# Patient Record
Sex: Female | Born: 1935 | Race: White | Hispanic: No | Smoking: Never smoker
Health system: Southern US, Community
[De-identification: ages and names within clinical notes are randomized; demographics above are authoritative.]

## PROBLEM LIST (undated history)

## (undated) DIAGNOSIS — I10 Essential (primary) hypertension: Secondary | ICD-10-CM

## (undated) DIAGNOSIS — Z8619 Personal history of other infectious and parasitic diseases: Secondary | ICD-10-CM

## (undated) DIAGNOSIS — E7800 Pure hypercholesterolemia, unspecified: Secondary | ICD-10-CM

## (undated) DIAGNOSIS — M1990 Unspecified osteoarthritis, unspecified site: Secondary | ICD-10-CM

## (undated) DIAGNOSIS — R197 Diarrhea, unspecified: Secondary | ICD-10-CM

## (undated) DIAGNOSIS — B029 Zoster without complications: Secondary | ICD-10-CM

## (undated) DIAGNOSIS — B0229 Other postherpetic nervous system involvement: Secondary | ICD-10-CM

## (undated) HISTORY — PX: CHOLECYSTECTOMY: SHX55

## (undated) HISTORY — PX: JOINT REPLACEMENT: SHX530

## (undated) HISTORY — PX: CARPAL TUNNEL RELEASE: SHX101

## (undated) HISTORY — DX: Other postherpetic nervous system involvement: B02.29

## (undated) HISTORY — PX: KNEE ARTHROSCOPY: SUR90

## (undated) HISTORY — PX: ABDOMINAL HYSTERECTOMY: SHX81

## (undated) HISTORY — PX: EYE SURGERY: SHX253

---

## 1995-09-13 ENCOUNTER — Encounter (INDEPENDENT_AMBULATORY_CARE_PROVIDER_SITE_OTHER): Payer: Self-pay | Admitting: *Deleted

## 1995-09-13 LAB — CONVERTED CEMR LAB

## 1998-05-19 ENCOUNTER — Ambulatory Visit (HOSPITAL_COMMUNITY): Admission: RE | Admit: 1998-05-19 | Discharge: 1998-05-19 | Payer: Self-pay | Admitting: Obstetrics & Gynecology

## 1998-09-07 ENCOUNTER — Ambulatory Visit (HOSPITAL_COMMUNITY): Admission: RE | Admit: 1998-09-07 | Discharge: 1998-09-07 | Payer: Self-pay | Admitting: Family Medicine

## 1999-12-02 ENCOUNTER — Ambulatory Visit (HOSPITAL_COMMUNITY): Admission: RE | Admit: 1999-12-02 | Discharge: 1999-12-02 | Payer: Self-pay | Admitting: Family Medicine

## 1999-12-02 ENCOUNTER — Encounter: Payer: Self-pay | Admitting: Family Medicine

## 2000-05-30 ENCOUNTER — Encounter: Admission: RE | Admit: 2000-05-30 | Discharge: 2000-05-30 | Payer: Self-pay | Admitting: Family Medicine

## 2000-06-28 ENCOUNTER — Encounter: Admission: RE | Admit: 2000-06-28 | Discharge: 2000-06-28 | Payer: Self-pay | Admitting: Family Medicine

## 2000-11-01 ENCOUNTER — Encounter: Admission: RE | Admit: 2000-11-01 | Discharge: 2000-11-01 | Payer: Self-pay | Admitting: Family Medicine

## 2000-12-07 ENCOUNTER — Ambulatory Visit (HOSPITAL_COMMUNITY): Admission: RE | Admit: 2000-12-07 | Discharge: 2000-12-07 | Payer: Self-pay | Admitting: *Deleted

## 2001-02-14 ENCOUNTER — Encounter: Admission: RE | Admit: 2001-02-14 | Discharge: 2001-02-14 | Payer: Self-pay | Admitting: Family Medicine

## 2001-06-26 ENCOUNTER — Encounter: Admission: RE | Admit: 2001-06-26 | Discharge: 2001-06-26 | Payer: Self-pay | Admitting: Family Medicine

## 2001-12-12 ENCOUNTER — Ambulatory Visit (HOSPITAL_BASED_OUTPATIENT_CLINIC_OR_DEPARTMENT_OTHER): Admission: RE | Admit: 2001-12-12 | Discharge: 2001-12-12 | Payer: Self-pay | Admitting: Specialist

## 2002-07-18 ENCOUNTER — Encounter (INDEPENDENT_AMBULATORY_CARE_PROVIDER_SITE_OTHER): Payer: Self-pay | Admitting: Specialist

## 2002-07-18 ENCOUNTER — Ambulatory Visit (HOSPITAL_COMMUNITY): Admission: RE | Admit: 2002-07-18 | Discharge: 2002-07-18 | Payer: Self-pay | Admitting: Gastroenterology

## 2003-04-28 ENCOUNTER — Encounter: Admission: RE | Admit: 2003-04-28 | Discharge: 2003-04-28 | Payer: Self-pay | Admitting: Internal Medicine

## 2003-04-28 ENCOUNTER — Encounter: Payer: Self-pay | Admitting: Internal Medicine

## 2005-05-24 ENCOUNTER — Ambulatory Visit (HOSPITAL_COMMUNITY): Admission: RE | Admit: 2005-05-24 | Discharge: 2005-05-24 | Payer: Self-pay | Admitting: Internal Medicine

## 2006-11-09 DIAGNOSIS — M129 Arthropathy, unspecified: Secondary | ICD-10-CM | POA: Insufficient documentation

## 2006-11-09 DIAGNOSIS — G834 Cauda equina syndrome: Secondary | ICD-10-CM

## 2006-11-09 DIAGNOSIS — E669 Obesity, unspecified: Secondary | ICD-10-CM | POA: Insufficient documentation

## 2006-11-09 DIAGNOSIS — E785 Hyperlipidemia, unspecified: Secondary | ICD-10-CM | POA: Insufficient documentation

## 2006-11-09 DIAGNOSIS — F339 Major depressive disorder, recurrent, unspecified: Secondary | ICD-10-CM | POA: Insufficient documentation

## 2006-11-09 DIAGNOSIS — I10 Essential (primary) hypertension: Secondary | ICD-10-CM | POA: Insufficient documentation

## 2006-11-10 ENCOUNTER — Encounter (INDEPENDENT_AMBULATORY_CARE_PROVIDER_SITE_OTHER): Payer: Self-pay | Admitting: *Deleted

## 2006-12-14 ENCOUNTER — Ambulatory Visit (HOSPITAL_COMMUNITY): Admission: RE | Admit: 2006-12-14 | Discharge: 2006-12-14 | Payer: Self-pay | Admitting: *Deleted

## 2008-02-11 ENCOUNTER — Ambulatory Visit (HOSPITAL_COMMUNITY): Admission: RE | Admit: 2008-02-11 | Discharge: 2008-02-11 | Payer: Self-pay | Admitting: Specialist

## 2008-05-26 ENCOUNTER — Inpatient Hospital Stay (HOSPITAL_COMMUNITY): Admission: RE | Admit: 2008-05-26 | Discharge: 2008-05-30 | Payer: Self-pay | Admitting: Specialist

## 2008-07-28 ENCOUNTER — Ambulatory Visit (HOSPITAL_COMMUNITY): Admission: RE | Admit: 2008-07-28 | Discharge: 2008-07-28 | Payer: Self-pay | Admitting: *Deleted

## 2009-07-31 ENCOUNTER — Ambulatory Visit (HOSPITAL_COMMUNITY): Admission: RE | Admit: 2009-07-31 | Discharge: 2009-07-31 | Payer: Self-pay | Admitting: Specialist

## 2009-09-09 ENCOUNTER — Ambulatory Visit (HOSPITAL_COMMUNITY): Admission: RE | Admit: 2009-09-09 | Discharge: 2009-09-09 | Payer: Self-pay | Admitting: *Deleted

## 2010-12-15 LAB — BASIC METABOLIC PANEL
BUN: 11 mg/dL (ref 6–23)
Chloride: 101 mEq/L (ref 96–112)
Creatinine, Ser: 0.52 mg/dL (ref 0.4–1.2)
GFR calc non Af Amer: >60 mL/min (ref >60)
Potassium: 3.4 mEq/L — ABNORMAL LOW (ref 3.5–5.1)

## 2011-01-25 NOTE — H&P (Signed)
NAME:  Terri Liu, Terri Liu                 ACCOUNT NO.:  [id number]   MEDICAL RECORD NO.:  [id number]          PATIENT TYPE:  INP   LOCATION:  NA                           FACILITY:  [facility]   PHYSICIAN:  Jene Every, M.D.    DATE OF BIRTH:  July 26, 1934   DATE OF ADMISSION:  05/26/2008  DATE OF DISCHARGE:                              HISTORY & PHYSICAL   CHIEF COMPLAINT:  Right knee pain.   HISTORY:  Terri Liu is a pleasant 77 year old female who is well-known to  our practice.  She has had history of ongoing knee pain for quite some  time.  She has undergone arthroscopic debridement with only short-term  relief of her symptoms.  She also undergone cortisone therapy with  minimal relief.  The patient notes persistent disabling pain.  Exam does  show decreased range of motion as well as trace effusion, pain with  patellofemoral compression.  X-ray as well a knee arthroscopy revealed  tricompartmental osteoarthrosis.  It was felt at this point the patient  would benefit from a total knee arthroplasty.  The risks and benefits of  this were discussed with the patient.  She does elect to proceed.   MEDICAL HISTORY:  Significant for hypertension, hyperlipidemia.   CURRENT MEDICATIONS:  1. HCTZ 25 mg 1 p.o. daily.  2. Metoprolol 25 mg 1 p.o. daily.  3. Aspirin 81 mg daily.  4. Lovastatin 40 mg 1 p.o. daily.  5. Vitamin D 50,000 units every week.  6. Oxybutynin 5 mg 1 p.o. daily.  7. B12 50 mg.  8. Tylenol.  9. Glucosamine chondroitin.  10.Fish oil.  11.Multivitamin.   ALLERGIES:  None.   PREVIOUS SURGERIES:  1. Cholecystectomy.  2. Hysterectomy.  3. Knee arthroscopy.  4. Bilateral bunionectomy.   SOCIAL HISTORY:  The patient is married.  History negative for tobacco  or alcohol.  She has 3 children.  Family will be caregivers following  surgery.   PRIMARY CARE PHYSICIAN:  Is at [facility].  His name is Dr. Fredrich Birks.   FAMILY HISTORY:  Significant for coronary artery disease and  cancer.   REVIEW OF SYSTEMS:  GENERAL:  The patient denies any fever, chills,  night sweats or bleeding tendencies.  CNS:  No blurred, double vision,  seizure, headache or paralysis.  RESPIRATORY:  No shortness of breath,  productive cough or hemoptysis.  CARDIOVASCULAR/CHEST:  No history of  orthopnea.  GU: No dysuria, hematuria or discharge.  GI: No nausea,  vomiting, diarrhea, constipation, melena or bloody stools.  MUSCULOSKELETAL:  As per HPI.   PHYSICAL EXAMINATION:  This is a n elderly white female seen upright in  no acute distress.  Pulse is 60, respiratory rate 12, BP 112/82.  GENERAL:  She does walk with an antalgic gait.  HEENT:  Atraumatic, normocephalic.  Pupils equal, round, and reactive to  light.  EOMs intact.  NECK:  Supple.  No lymphadenopathy.  CHEST:  Clear to auscultation bilaterally.  BREASTS/GU:  Not examined, not pertinent to HPI.  HEART:  Regular rate and rhythm without murmurs, gallops or rubs.  ABDOMEN:  Protuberant.  Bowel sounds x4.  SKIN:  No rashes or lesions are noted in regard to the knee.  She does have mild effusion on the right.  She is tender along the  medial joint line.  She does have pain with patellofemoral compression.  Range of motion is 0-100 degrees.   IMPRESSION:  Degenerative joint disease right knee.  It has failed  conservative treatment.   PLAN:  The patient will be admitted to [facility] to undergo a  right total knee arthroplasty.  If any medical concerns come up, will  consult [facility].      Roma Schanz, P.A.      Jene Every, M.D.  Electronically Signed    CS/MEDQ  D:  05/23/2008  T:  05/24/2008  Job:  [id number]

## 2011-01-25 NOTE — Op Note (Signed)
NAME:  COXMakaley, Storts                 ACCOUNT NO.:  [id number]   MEDICAL RECORD NO.:  [id number]          PATIENT TYPE:  AMB   LOCATION:  DAY                          FACILITY:  [facility]   PHYSICIAN:  Jene Every, M.D.    DATE OF BIRTH:  1934-07-03   DATE OF PROCEDURE:  02/11/2008  DATE OF DISCHARGE:                               OPERATIVE REPORT   PREOPERATIVE DIAGNOSIS:  Degenerative joint disease, medial meniscus  tear, right knee.   POSTOPERATIVE DIAGNOSIS:  Degenerative joint disease, medial meniscus  tear, right knee, lateral meniscus tear, loose bodies in suprapatellar  pouch.   PROCEDURE PERFORMED:  1. Right knee arthroscopy with extensive debridement.  2. Partial medial meniscectomy.  3. Partial lateral meniscectomy.  4. Removal of loose body, large, 2 cm in diameter.   BRIEF HISTORY AND INDICATIONS:  This is a 77 year old right knee pain,  degenerative changes noted on the radiograph.  She is indicated for  possible total knee replacement.  She had loose body and degenerative  changes and we offered her possible arthroscopic debridement.  Prior to  that risks and benefits were discussed including bleeding, infection, no  change in symptoms, worsening symptoms, need for repeat debridement or  total knee arthroplasty in the future, anesthetic complications etc.   TECHNIQUE:  The patient is placed in supine position.  After induction  of adequate general anesthesia and 1 gram Kefzol, the right lower  extremity was prepped and draped in the usual sterile fashion.  A  lateral parapatellar portal and superomedial parapatellar portal was  fashioned with a #11 blade.  Ingress cannula atraumatically placed.  Irrigant was utilized to insufflate the joint.  Under direct  visualization, medial parapatellar portal was fashioned with a #11 blade  after localization with an 18 gauge needle sparing the medial meniscus.  Extensive degenerative changes were noted within the joint.   Medial  compartment, radial tear of the medial meniscus was resected with a  straight basket rongeur and a 42 Cuda shaver to a stable base.  There  were extensive grade 3 changes of tibial plateau and femoral condyle.  Chondroplasties were performed here.  The remainder of the  meniscus was  stable.  ACL was mildly attenuated lateral compartment revealed  extensive grade 4 changes and lateral meniscus tear.  Collene Mares was  introduced and utilized to perform chondroplasties of the femur and  tibia, partial lateral meniscectomy to a stable base further contoured  with 42  Cuda shaver.  In the suprapatella pouch extensive grade 3  changes of the patella, chondroplasty performed here.  There was a large  loose body measuring 2 cm in diameter.  We made a separate superior  medial portal and inserted a pituitary and removed the loose body with  pituitary without difficulty.  There is normal patellofemoral tracking  but extensive degenerative changes.  Next, the gutters were then  examined.  They were unremarkable.  The knee was copiously lavaged.  Re-  examined all compartments.  No further pathology  amenable to intervention.  Portals were closed with 4-0 nylon simple  sutures, 0.25%  Marcaine with epinephrine was infiltrated in the joint.  The wound was dressed sterilely.  Awakened without difficulty and  transported to recovery in satisfactory addition.  The patient tolerated  the procedure well without complications.      Jene Every, M.D.  Electronically Signed     JB/MEDQ  D:  02/11/2008  T:  02/11/2008  Job:  [id number]

## 2011-01-25 NOTE — Op Note (Signed)
NAME:  Terri Liu, Terri Liu                 ACCOUNT NO.:  [id number]   MEDICAL RECORD NO.:  [id number]          PATIENT TYPE:  INP   LOCATION:  1619                         [facility]ACILITY:  [facility]   PHYSICIAN:  Jene Every, M.D.    DATE OF BIRTH:  1934-08-11   DATE OF PROCEDURE:  05/26/2008  DATE OF DISCHARGE:                               OPERATIVE REPORT   PREOPERATIVE DIAGNOSIS:  Degenerative joint disease, right knee.   POSTOPERATIVE DIAGNOSIS:  Degenerative joint disease, right knee.   PROCEDURE PERFORMED:  Right total knee arthroplasty.   ASSISTANT:  Strader.   COMPONENTS:  DePuy Sigma rotating platform, 3 femur, 3 tibia, 10 insert,  32 patella.   BRIEF HISTORY AND INDICATION:  A 77 year old with end-stage  osteoarthritis of knee indicated for total knee replacement.  Risks and  benefits discussed including bleeding, infection, damage to vascular  structures, suboptimal range of motion, DVT, PE, anesthetic  complications, dissociation with components, need for revision, etc.   TECHNIQUE:  The patient in supine position after the induction of  adequate anesthesia and 2 g of Kefzol, the  right lower extremity was  prepped and draped and exsanguinated in the usual sterile fashion.  Thigh tourniquet inflated to 325 mmHg.  An incision was made; medial  parapatellar arthrotomy was performed.  Clear synovial fluid was  evacuated, patella everted; knee was flexed.  Tricompartmental  osteoarthrosis was noted, osteophytes removed with a rongeur.  The ACL  remnants of the medial and lateral menisci were removed.  Step-drill was  utilized to enter the femoral canal and irrigated 5 degrees, left  intramedullary guide placed 10 mm off the distal femur was cut,  protecting the soft tissue.  This was incised with 3 off the anterior  cortex, tendon slight external rotation.  Anterior, posterior, and  chamfer cuts were then performed.  Box cut was then performed as well,  protecting the soft tissues at  all times.  The tibia was subluxed,  flexed, external alignment guide for off the medial side.  The medial  and lateral sides were identical.  We took 4 off the medial side.  With  bisecting the ankle parallel to the tibia, medial to the tibial  tubercle, oscillating saw utilized for the tibial cut.  Soft tissues  protected at all times.  We then trialed in flexion/extension, and the  gaps were equivalent.  We then trialed  with a grade 3 on the tibia.  Appropriate rotation, maximal coverage, used the punch guide and the pin  guide.  The trial tibia was placed.  A trial femur was placed, 10 insert  reduced, full extension, full flexion, no instability.  Patella was  measured.  Twenty-two were removed, 8 off the patella for a 14.  We  utilized the planer.  Peg holes were then drilled for the patellar  component slightly medializing that.  There was good patellofemoral  tracking, replacement with a trial patella.  All trials were then  removed.  We inspected posteriorly, and small remnants of the medial and  lateral menisci were removed as well as the ACL.  Next, we copiously irrigated with pulsatile lavage, mixed the cement  flexed, dried the surfaces.  We cemented the proximal tibia on the femur  and on the patella back to the tibial component, femoral component,  redundant cement removed, the clamp of the patella component; the trial  10 insert was inserted.  Knee was reduced, held in extension with an  axial load applied.  Redundant cement removed.  Cement cured.  Full  extension, full flexion, no instability, varus-valgus stressing of 0-30  degrees.  Normal patellofemoral tracking.  Removed the trial 10 and  placed a permanent 10 after we inspected the joint.  Redundant cement  removed.  The soft tissue intervening was known, was found and was  copiously irrigated.  Permanent insert was then inserted and impacted;  full extension, full flexion, good patellofemoral tracking.  Placed  a  Hemovac and brought out through a lateral stab wound in the skin.  Marcaine 0.25% with epinephrine was placed in the joint.  Bone wax was  placed in the cancellous surfaces.  Patellar arthrotomy was repaired  with #1 Vicryl interrupted figure-of-eight sutures.  Subcutaneous tissue  reapproximated with 2-0 Vicryl simple sutures.  The skin was  reapproximated with staples.  The wound was dressed sterilely.  She was  awakened without difficulty and transported to the recovery room in  satisfactory condition.  Tourniquet time was 2 hours against gravity  with thigh held.  She had 90 degrees of flexion.  She had full  extension, full flexion and good stability following closure of the  skin.   The patient tolerated the procedure well with no complications.      Jene Every, M.D.  Electronically Signed     JB/MEDQ  D:  05/26/2008  T:  05/27/2008  Job:  [id number]

## 2011-01-28 NOTE — Discharge Summary (Signed)
NAME:  Terri Liu, Terri Liu                 ACCOUNT NO.:  [id number]   MEDICAL RECORD NO.:  [id number]          PATIENT TYPE:  INP   LOCATION:  1619                         [facility]ACILITY:  [facility]   PHYSICIAN:  Jene Every, M.D.    DATE OF BIRTH:  1934/04/02   DATE OF ADMISSION:  05/26/2008  DATE OF DISCHARGE:  05/30/2008                               DISCHARGE SUMMARY   ADMISSION DIAGNOSES:  1. Degenerative joint disease, right knee.  2. Hypertension.  3. Hyperlipidemia.   DISCHARGE DIAGNOSES:  1. Degenerative joint disease, right knee.  2. Hypertension.  3. Hyperlipidemia.  4. Status post right total knee arthroplasty.  5. Resolved hypokalemia.  6. Stable postoperative anemia, resolved.  7. Urinary tract infection.   HISTORY:  Terri Liu is a pleasant 77 year old female who is well known to  our practice.  She has a longstanding history of ongoing knee pain.  She  has undergone arthroscopic debridement in the past with only short-term  relief of her symptoms.  She has also undergone cortisone therapy as  well as viscosupplementation.  Unfortunately, the patient had continued  persistent disabling knee pain.  It is felt at this time she would  benefit from a total knee arthroplasty.  Risks and benefits of this were  discussed with the patient, and she does elect to proceed.   PROCEDURE:  The patient was taken to the OR on central May 26, 2008, and underwent a right total knee arthroplasty.   SURGEON:  Dr. Jene Every   ASSISTANT:  Roma Schanz, PA-C   ANESTHESIA:  General.   COMPLICATIONS:  None.   CONSULTS:  1. PT/OT.  2. Care management.   LABORATORY:  Preoperative CBC shows white cell count of 6.3, hemoglobin  15.9, hematocrit 46.5.  These were monitored throughout the hospital  course.  White cell count remained normal, at time of discharge 6.4;  hemoglobin did drop slightly.  The patient was asymptomatic.  At time of  discharge, hemoglobin was 10.9, hematocrit  31.2.  Coagulation studies  done preoperatively showed PT of 12.4, INR of 0.9, PTT of 23.  Patient  was placed on Coumadin postoperatively.  At time of discharge, she was  therapeutic on her Coumadin with INR of 1.8.  Routine chemistries done  preoperatively showed normal sodium of 143, potassium 4.4, slightly  elevated glucose of 118 with a normal BUN and creatinine.  These were  monitored throughout the hospital course.  Sodium remained stable, at  time of discharge 136; potassium did drop slightly to 3.4.  This was  supplemented.  At time of discharge, she was 3.7.  Glucose continued to  fluctuate during the hospital stay.  Renal function remained stable.  Preoperative urinalysis showed large amount of leukocyte esterase with 7-  10 WBCs seen per high-powered field.  This was repeated at time of  admission which showed worsening of this; it was 11-20 WBCs seen per  high-powered field.  The patient was treated with Cipro.  Urine was sent  for culture which came back no growth.  Therefore, the Cipro was  discontinued.  The patient's blood type was A positive.  I do not see a  copy of the preoperative chest x-ray or EKG in the chart.   HOSPITAL COURSE:  The patient was admitted, taken to the OR and  underwent the above-stated procedure.  One Hemovac drain was placed  intraoperatively.  She was then transferred to the PACU and then to  orthopedic floor for continued postoperative care.  She was placed on  PCA for postoperative pain relief.  She was also started on Coumadin for  DVT prophylaxis.  Postoperatively, the patient did fairly well.  She did  have some episodes initially postoperatively of emesis secondary to pain  medication, otherwise no complaints.  She denied any urinary tract  symptoms, no headache, no chest pain, no shortness of breath.  Vital  signs were stable.  Dressing was clean and dry.  We did discontinue the  O2.  We continued her on Cipro.  She was weaned from the  PCA.  PT/OT  were initiated as well as CPM machine.  On postoperative day #2, the  patient was doing better.  She denied any further emesis overnight.  She  was complaining of headache.  The OT was not discontinued as ordered on  day #1;  __________ was  causing her some sinus pressure.  She was  voiding and passing flatus without difficulty, slightly febrile at 99.3.  Hemovac was discontinued with tip intact.  Dressing was changed.  There  was minimal edema, no evidence of infection.  Calf soft, nontender.  Motor and neurovascular function remained intact.  She had a slight  decrease in her potassium to 3.4.  This was supplemented.  Coumadin was  continued per pharmacy.  We did Hep-Lock her IV.  Incentive spirometer  was encouraged.  The patient continued to progress well with therapy.  Discharge planning was initiated.  Her headache resolved.  She continued  to be slightly febrile on postop day #3 of 100.8 but no evidence of  infection.  She continued to deny any urinary-type symptoms.  On  postoperative day #4, the patient was doing very well.  She had been up  ambulating without difficulty.  She was felt ready for discharge.  Vital  signs were stable.  She was therapeutic on her Coumadin with INR of 1.8,  hemoglobin 10.9.  Urine culture came back negative.   DISPOSITION:  The patient is stable and will be discharged home with  home health PT/OT.  She is to follow up with Dr. Shelle Iron in approximately  10-14 days for suture removal and x-ray.   ACTIVITY:  She is to ambulate as tolerated utilizing her walker.  She  can continue to use her knee immobilizer until she can straight-leg  raise x 10; continue with elevation 6 times a day for 20 minutes at a  time.   DIET:  As tolerated.   WOUND CARE:  She should change her dressing daily; keep this clean and  dry.   DISCHARGE MEDICATIONS:  All home medications with vitamin C 500 mg  daily, Norco 1-2 p.o. q.4-6 h. p.r.n. pain,  over-the-counter stool  softener, multivitamin with iron, Robaxin 500 mg  1 p.o. q.8 h. p.r.n.  spasm.  Coumadin as dosed per pharmacy.   CONDITION ON DISCHARGE:  Stable.   FINAL DIAGNOSIS:  Status post right total knee arthroplasty      Roma Schanz, P.A.      Jene Every, M.D.  Electronically Signed    CS/MEDQ  D:  06/11/2008  T:  06/11/2008  Job:  [id number]

## 2011-01-28 NOTE — Op Note (Signed)
NAME:  Terri Liu, Terri Liu                           ACCOUNT NO.:  [id number]   MEDICAL RECORD NO.:  [id number]                   PATIENT TYPE:  AMB   LOCATION:  ENDO                                 FACILITY:  [facility]   PHYSICIAN:  Danise Edge, M.D.                DATE OF BIRTH:  07/25/1934   DATE OF PROCEDURE:  07/18/2002  DATE OF DISCHARGE:                                 OPERATIVE REPORT   PROCEDURE:  Screening colonoscopy.   INDICATIONS:  The patient is a 77 year old female born 09-Jun-2034.  The  patient was scheduled to undergo her first screening colonoscopy with  polypectomy to prevent colon cancer.  I discussed with her the complications  associated with colonoscopy and polypectomy, including a 15 per thousand  risk of bleeding and one per thousand risk of colon perforation requiring  surgical repair.  The patient has signed the operative permit.   ENDOSCOPIST:  Danise Edge, M.D.   PREMEDICATION:  Fentanyl 100 mcg, Versed 10 mg.   ENDOSCOPE:  Olympus pediatric colonoscope.   DESCRIPTION OF PROCEDURE:  After obtaining informed consent, the patient was  placed in the left lateral decubitus position.  I administered intravenous  fentanyl and intravenous Versed to achieve conscious sedation for the  procedure.  The patient's blood pressure, oxygen saturation, and cardiac  rhythm were monitored throughout the procedure and documented in the medical  record.   Anal inspection was normal.  Digital rectal exam was normal.  The Olympus  pediatric video colonoscope was introduced into the rectum and easily  advanced to the cecum.  Colonic preparation for the exam today was  excellent.   Rectum normal.   Sigmoid colon and descending colon normal.   Splenic flexure normal.   Transverse colon:  From the mid-transverse colon, a 3 mm polyp was lifted  with submucosal saline injection and removed with the cold biopsy forceps  and also the hot biopsy forceps.  I was unable to get a  snare around the  lesion, but it appeared to be completely removed with the biopsy forceps.   Hepatic flexure normal.   Ascending colon normal.   Cecum and ileocecal valve normal.    ASSESSMENT:  From the mid-transverse colon, a 3 mm sessile polyp was  removed; otherwise normal proctocolonoscopy to the cecum.  No endoscopic  evidence for the presence of colon cancer.   RECOMMENDATIONS:  Repeat colonoscopy in five years if polyp returns  neoplastic.                                               Danise Edge, M.D.    MJ/MEDQ  D:  07/18/2002  T:  07/18/2002  Job:  [id number]   cc:   Armstead Peaks, M.D.  [address]  [place], [place] [address]  Fax: [phone]

## 2011-01-28 NOTE — Op Note (Signed)
[place]  Patient:    Terri Liu, Terri Liu Visit Number: [id number] MRN: [id number]          Service Type: DSU Location: [facility] Attending Physician:  Rocky Crafts Dictated by:   Michael Litter. Montez Morita, M.D. Admit Date:  12/12/2001 Discharge Date: 12/12/2001                             Operative Report  PREOPERATIVE DIAGNOSES:  Torn medial and lateral menisci and partial tear, anterior cruciate ligament, involving the left knee.  POSTOPERATIVE DIAGNOSES:  Torn medial and lateral menisci and partial tear, anterior cruciate ligament, involving the left knee.  PROCEDURE:  Arthroscopic debridement, medial and lateral menisci, and partial of anterior cruciate ligament.  DESCRIPTION OF PROCEDURE:  After suitable local anesthesia with sedation, the knee was prepped and draped routinely.  Arthroscopy was carried out through the usual portals with a lateral parapatellar inflow portal and an inferolateral portal for the scope and an inferomedial portal made under direct vision.  The patellofemoral joint had some chondromalacic changes but not down to bare bone.  On the medial side there was bare bone exposing the tibia, about a dime-sized area underneath the tear of the meniscus, which was then trimmed back with a combination of 3.5 and 4.2 rotary meniscotomes as well as a slightly upbiting punch.  At the end the horizontal cleavage remained in the posterior horn that was stable.  The lateral meniscus and lateral joint looked quite good except for the very anterior horn, where there was some fraying and splitting, and this was trimmed back with the rotary meniscotome.  ACL had some partial tearing and its loose fibers were smoothed as well as some thickened synovium at the very anterior part of the joint.  At the end of the procedure 20 cc of 0.5% plain Marcaine were put into the knee. The puncture wounds were sutured with one suture each, a nice  compressing dressing.  She goes to recovery in good condition. Dictated by:   [employer]. Montez Morita, M.D. Attending Physician:  Rocky Crafts DD:  12/12/01 TD:  12/12/01 Job: [id number]

## 2011-06-09 LAB — DIFFERENTIAL
Lymphocytes Relative: 25
Lymphs Abs: 1.6
Monocytes Relative: 8
Neutro Abs: 3.9
Neutrophils Relative %: 63

## 2011-06-09 LAB — CBC
Platelets: 211
RBC: 4.82
WBC: 6.2

## 2011-06-09 LAB — BASIC METABOLIC PANEL
CO2: 31
Calcium: 9.6
Creatinine, Ser: 0.67
Sodium: 143

## 2011-06-13 LAB — BASIC METABOLIC PANEL
BUN: 3 — ABNORMAL LOW
BUN: 5 — ABNORMAL LOW
CO2: 34 — ABNORMAL HIGH
Calcium: 8.1 — ABNORMAL LOW
Chloride: 100
GFR calc non Af Amer: >60
GFR calc non Af Amer: >60
GFR calc non Af Amer: >60
Glucose, Bld: 128 — ABNORMAL HIGH
Potassium: 3.4 — ABNORMAL LOW
Potassium: 4.2
Sodium: 136
Sodium: 139

## 2011-06-13 LAB — URINE CULTURE
Colony Count: NO GROWTH
Culture: NO GROWTH

## 2011-06-13 LAB — CBC
HCT: 36
Hemoglobin: 10.9 — ABNORMAL LOW
Hemoglobin: 11.4 — ABNORMAL LOW
Hemoglobin: 12.4
MCHC: 34.4
MCV: 95.3
Platelets: 160
RBC: 3.24 — ABNORMAL LOW
RBC: 3.3 — ABNORMAL LOW
RBC: 3.77 — ABNORMAL LOW
RDW: 12.4
WBC: 6.4
WBC: 7.4
WBC: 8.6

## 2011-06-13 LAB — PROTIME-INR
INR: 1.1
INR: 1.6 — ABNORMAL HIGH
INR: 1.8 — ABNORMAL HIGH
INR: 1.9 — ABNORMAL HIGH
Prothrombin Time: 22.6 — ABNORMAL HIGH

## 2011-06-15 LAB — COMPREHENSIVE METABOLIC PANEL
AST: 21
BUN: 8
CO2: 31
Chloride: 101
Creatinine, Ser: 0.66
GFR calc non Af Amer: >60
Total Bilirubin: 1.4 — ABNORMAL HIGH

## 2011-06-15 LAB — CBC
HCT: 46.5 — ABNORMAL HIGH
Hemoglobin: 15.9 — ABNORMAL HIGH
Platelets: 235
RBC: 4.83
WBC: 6.3

## 2011-06-15 LAB — DIFFERENTIAL
Eosinophils Absolute: 0.2
Lymphs Abs: 1.9
Neutro Abs: 3.7
Neutrophils Relative %: 58

## 2011-06-15 LAB — URINE MICROSCOPIC-ADD ON

## 2011-06-15 LAB — PROTIME-INR
INR: 0.9
Prothrombin Time: 12.4

## 2011-06-15 LAB — URINALYSIS, ROUTINE W REFLEX MICROSCOPIC
Bilirubin Urine: NEGATIVE
Hgb urine dipstick: NEGATIVE
Nitrite: NEGATIVE
Protein, ur: NEGATIVE
Specific Gravity, Urine: 1.016
Urobilinogen, UA: 0.2
Urobilinogen, UA: 0.2

## 2011-06-15 LAB — TYPE AND SCREEN: Antibody Screen: NEGATIVE

## 2012-04-28 ENCOUNTER — Encounter (HOSPITAL_COMMUNITY): Payer: Self-pay | Admitting: *Deleted

## 2012-04-28 ENCOUNTER — Emergency Department (HOSPITAL_COMMUNITY)
Admission: EM | Admit: 2012-04-28 | Discharge: 2012-04-28 | Disposition: A | Discharge status: Home / Self Care | Payer: Medicare Other | Attending: Emergency Medicine | Admitting: Emergency Medicine

## 2012-04-28 DIAGNOSIS — R51 Headache: Secondary | ICD-10-CM | POA: Insufficient documentation

## 2012-04-28 DIAGNOSIS — B0229 Other postherpetic nervous system involvement: Secondary | ICD-10-CM

## 2012-04-28 HISTORY — DX: Zoster without complications: B02.9

## 2012-04-28 HISTORY — DX: Essential (primary) hypertension: I10

## 2012-04-28 HISTORY — DX: Pure hypercholesterolemia, unspecified: E78.00

## 2012-04-28 MED ORDER — HYDROMORPHONE HCL PF 1 MG/ML IJ SOLN
0.5000 mg | Freq: Once | INTRAMUSCULAR | Status: AC
  Administered 2012-04-28: 0.5 mg via INTRAVENOUS
  Filled 2012-04-28: qty 1

## 2012-04-28 MED ORDER — LORAZEPAM 2 MG/ML IJ SOLN
1.0000 mg | Freq: Once | INTRAMUSCULAR | Status: AC
Start: 2012-04-28 — End: 2012-04-28
  Administered 2012-04-28: 1 mg via INTRAVENOUS
  Filled 2012-04-28: qty 1

## 2012-04-28 MED ORDER — OXYCODONE-ACETAMINOPHEN 5-325 MG PO TABS: 2.0000 | ORAL_TABLET | ORAL | Status: AC | PRN

## 2012-04-28 MED ORDER — LORAZEPAM 1 MG PO TABS: 1.0000 mg | ORAL_TABLET | Freq: Three times a day (TID) | ORAL | Status: AC

## 2012-04-28 MED ORDER — GABAPENTIN 100 MG PO CAPS: 100.0000 mg | ORAL_CAPSULE | Freq: Three times a day (TID) | ORAL | Status: DC

## 2012-04-28 NOTE — ED Provider Notes (Signed)
History     CSN: [id number]  Arrival date & time 04/28/12  0865   First MD Initiated Contact with Patient 04/28/12 1000      Chief Complaint  Patient presents with  . Headache    (Consider location/radiation/quality/duration/timing/severity/associated sxs/prior treatment) Patient is a 78 y.o. female presenting with headaches. The history is provided by the patient.  Headache    patient here complaining of right-sided temporal headache described as burning and intermittent shocks. History of herpes zoster to the same area and has been diagnosed with postherpetic neuralgia. Saw her Dr. for similar symptoms 5 days ago and she was placed on prednisone as well as medications for a urinary tract infection. She was also started on antivirals for possible recurrence of shingles. Nothing makes her symptoms better and touching her skin makes it worse. She describes as a burning-like sensation. No fever, neck pain, photophobia. When the episodes happening last for approximately 10 minutes and have severe. She is taking Neurontin 300 mg a day as well  Past Medical History  Diagnosis Date  . Shingles   . Hypertension   . High cholesterol     History reviewed. No pertinent past surgical history.  History reviewed. No pertinent family history.  History  Substance Use Topics  . Smoking status: Not on file  . Smokeless tobacco: Not on file  . Alcohol Use: No    OB History    Grav Para Term Preterm Abortions TAB SAB Ect Mult Living                  Review of Systems  Neurological: Positive for headaches.  All other systems reviewed and are negative.    Allergies  Review of patient's allergies indicates no known allergies.  Home Medications  No current outpatient prescriptions on file.  BP 176/83  Pulse 114  Temp 97.9 F (36.6 C) (Oral)  Resp 22  Physical Exam  Nursing note and vitals reviewed. Constitutional: She is oriented to person, place, and time. She appears  well-developed and well-nourished.  Non-toxic appearance. No distress.  HENT:  Head: Normocephalic and atraumatic.       No evidence of vesicles or lesions to the patient's face. Slight erythema and tender to touch at the patient's right parietal and forehead.  Eyes: Conjunctivae, EOM and lids are normal. Pupils are equal, round, and reactive to light.       Right eye supple conjunctival hemorrhage. Cornea normal. No eye drainage.  Neck: Normal range of motion. Neck supple. No tracheal deviation present. No mass present.  Cardiovascular: Normal rate, regular rhythm and normal heart sounds.  Exam reveals no gallop.   No murmur heard. Pulmonary/Chest: Effort normal and breath sounds normal. No stridor. No respiratory distress. She has no decreased breath sounds. She has no wheezes. She has no rhonchi. She has no rales.  Abdominal: Soft. Normal appearance and bowel sounds are normal. She exhibits no distension. There is no tenderness. There is no rebound and no CVA tenderness.  Musculoskeletal: Normal range of motion. She exhibits no edema and no tenderness.  Neurological: She is alert and oriented to person, place, and time. She has normal strength. No cranial nerve deficit or sensory deficit. GCS eye subscore is 4. GCS verbal subscore is 5. GCS motor subscore is 6.  Skin: Skin is warm and dry. No abrasion and no rash noted.  Psychiatric: She has a normal mood and affect. Her speech is normal and behavior is normal.    ED  Course  Procedures (including critical care time)  Labs Reviewed - No data to display No results found.   No diagnosis found.    MDM  Spoke with neurology on call and patient given pain medication here. Will increase her Neurontin to 400 mg a day total. She was also told to take capsaicin cream. Will increase her analgesics.  11:59 AM Had a long discussion with the patient and her family concerning treatment options. He relates me that the patient's symptoms actually  been going on for about a year. Patient will be given medications as above and referrals to neurology. Repeat neurological exam at time of discharge remained stable       Toy Baker, MD 04/28/12 1159

## 2012-04-28 NOTE — ED Notes (Signed)
Pt presents to department for evaluation of "burning" sensation to forehead and R side of face. Ongoing x1 week. Pt was seen by PCP and placed on medication to treat shingles, unable to remember name. Pt states she has "episodes" where pain becomes more severe. Now describes as 10/10 "stinging" sensation. Redness noted to R eye. Denies nausea/vomiting. Also states intermittent headache. She is conscious alert and oriented x4. No neurological deficits noted.

## 2012-04-28 NOTE — ED Notes (Signed)
Pt states she feels much better, denies pain at the time. Encouraged to follow up with PCP. Pt ambulatory to discharge.

## 2012-04-28 NOTE — ED Notes (Signed)
Pt reports a "shocking and burning pain" in her head, started on Thursday and went to pcp. She was told it was nerve damage to forehead following shingles one year ago. No acute distress noted at triage.

## 2012-09-26 ENCOUNTER — Other Ambulatory Visit (HOSPITAL_COMMUNITY): Payer: Self-pay | Admitting: Family Medicine

## 2012-09-26 DIAGNOSIS — Z780 Asymptomatic menopausal state: Secondary | ICD-10-CM

## 2012-09-26 DIAGNOSIS — Z1231 Encounter for screening mammogram for malignant neoplasm of breast: Secondary | ICD-10-CM

## 2012-10-05 ENCOUNTER — Other Ambulatory Visit (HOSPITAL_COMMUNITY): Payer: Medicare Other

## 2012-10-05 ENCOUNTER — Ambulatory Visit (HOSPITAL_COMMUNITY): Payer: Medicare Other

## 2012-10-16 ENCOUNTER — Ambulatory Visit (HOSPITAL_COMMUNITY)
Admission: RE | Admit: 2012-10-16 | Discharge: 2012-10-16 | Disposition: A | Discharge status: Home / Self Care | Payer: Medicare Other | Source: Ambulatory Visit | Attending: Family Medicine | Admitting: Family Medicine

## 2012-10-16 ENCOUNTER — Other Ambulatory Visit (HOSPITAL_COMMUNITY): Payer: Self-pay | Admitting: Family Medicine

## 2012-10-16 DIAGNOSIS — Z13820 Encounter for screening for osteoporosis: Secondary | ICD-10-CM | POA: Insufficient documentation

## 2012-10-16 DIAGNOSIS — Z1231 Encounter for screening mammogram for malignant neoplasm of breast: Secondary | ICD-10-CM | POA: Insufficient documentation

## 2012-10-16 DIAGNOSIS — Z780 Asymptomatic menopausal state: Secondary | ICD-10-CM | POA: Insufficient documentation

## 2014-01-13 ENCOUNTER — Other Ambulatory Visit: Payer: Self-pay | Admitting: Psychiatry

## 2014-01-13 DIAGNOSIS — M79603 Pain in arm, unspecified: Secondary | ICD-10-CM

## 2014-01-13 DIAGNOSIS — M542 Cervicalgia: Secondary | ICD-10-CM

## 2014-01-20 ENCOUNTER — Ambulatory Visit
Admission: RE | Admit: 2014-01-20 | Discharge: 2014-01-20 | Disposition: A | Discharge status: Home / Self Care | Payer: Commercial Managed Care - HMO | Source: Ambulatory Visit | Attending: Psychiatry | Admitting: Psychiatry

## 2014-01-20 DIAGNOSIS — M79603 Pain in arm, unspecified: Secondary | ICD-10-CM

## 2014-01-20 DIAGNOSIS — M542 Cervicalgia: Secondary | ICD-10-CM

## 2015-01-19 ENCOUNTER — Encounter (INDEPENDENT_AMBULATORY_CARE_PROVIDER_SITE_OTHER): Payer: PPO | Admitting: Ophthalmology

## 2015-01-19 DIAGNOSIS — H43813 Vitreous degeneration, bilateral: Secondary | ICD-10-CM

## 2015-01-19 DIAGNOSIS — H35371 Puckering of macula, right eye: Secondary | ICD-10-CM

## 2015-01-19 DIAGNOSIS — H35033 Hypertensive retinopathy, bilateral: Secondary | ICD-10-CM | POA: Diagnosis not present

## 2015-01-19 DIAGNOSIS — H338 Other retinal detachments: Secondary | ICD-10-CM

## 2015-01-19 DIAGNOSIS — I10 Essential (primary) hypertension: Secondary | ICD-10-CM | POA: Diagnosis not present

## 2015-01-19 NOTE — H&P (Signed)
PIETRA ZULUAGA is an 81 y.o. female.   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 Shingles   . Hypertension   . High cholesterol     No past surgical history on file.  No family history on file. Social History:  reports that she does not drink alcohol or use illicit drugs. Her tobacco history is not on file.  Allergies: No Known Allergies  No prescriptions prior to admission    Review of systems otherwise negative  There were no vitals taken for this visit.  Physical exam: Mental status: oriented x3. Eyes: See eye exam associated with this date of surgery in media tab.  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

## 2015-01-26 ENCOUNTER — Encounter (HOSPITAL_COMMUNITY): Payer: Self-pay | Admitting: *Deleted

## 2015-01-26 MED ORDER — GATIFLOXACIN 0.5 % OP SOLN: 1.0000 [drp] | OPHTHALMIC | Status: DC | PRN

## 2015-01-26 MED ORDER — CEFAZOLIN SODIUM-DEXTROSE 2-3 GM-% IV SOLR
2.0000 g | INTRAVENOUS | Status: AC
  Administered 2015-01-27: 2 g via INTRAVENOUS

## 2015-01-26 MED ORDER — CYCLOPENTOLATE HCL 1 % OP SOLN: 1.0000 [drp] | OPHTHALMIC | Status: DC

## 2015-01-26 MED ORDER — PHENYLEPHRINE HCL 2.5 % OP SOLN: 1.0000 [drp] | OPHTHALMIC | Status: DC | PRN

## 2015-01-26 MED ORDER — TROPICAMIDE 1 % OP SOLN: 1.0000 [drp] | OPHTHALMIC | Status: DC | PRN

## 2015-01-27 ENCOUNTER — Ambulatory Visit (HOSPITAL_COMMUNITY): Payer: PPO | Admitting: Anesthesiology

## 2015-01-27 ENCOUNTER — Encounter (HOSPITAL_COMMUNITY): Payer: Self-pay | Admitting: General Practice

## 2015-01-27 ENCOUNTER — Ambulatory Visit (HOSPITAL_COMMUNITY)
Admission: RE | Admit: 2015-01-27 | Discharge: 2015-01-28 | Disposition: A | Discharge status: Home / Self Care | Payer: PPO | Source: Ambulatory Visit | Attending: Ophthalmology | Admitting: Ophthalmology

## 2015-01-27 ENCOUNTER — Encounter (HOSPITAL_COMMUNITY)
Admission: RE | Disposition: A | Discharge status: Home / Self Care | Payer: Self-pay | Source: Ambulatory Visit | Attending: Ophthalmology

## 2015-01-27 DIAGNOSIS — F329 Major depressive disorder, single episode, unspecified: Secondary | ICD-10-CM | POA: Diagnosis not present

## 2015-01-27 DIAGNOSIS — I10 Essential (primary) hypertension: Secondary | ICD-10-CM | POA: Diagnosis not present

## 2015-01-27 DIAGNOSIS — Z6830 Body mass index (BMI) 30.0-30.9, adult: Secondary | ICD-10-CM | POA: Insufficient documentation

## 2015-01-27 DIAGNOSIS — E669 Obesity, unspecified: Secondary | ICD-10-CM | POA: Diagnosis not present

## 2015-01-27 DIAGNOSIS — H33001 Unspecified retinal detachment with retinal break, right eye: Secondary | ICD-10-CM | POA: Diagnosis not present

## 2015-01-27 DIAGNOSIS — H35371 Puckering of macula, right eye: Secondary | ICD-10-CM | POA: Diagnosis present

## 2015-01-27 DIAGNOSIS — E780 Pure hypercholesterolemia: Secondary | ICD-10-CM | POA: Diagnosis not present

## 2015-01-27 DIAGNOSIS — H5060 Mechanical strabismus, unspecified: Secondary | ICD-10-CM | POA: Insufficient documentation

## 2015-01-27 DIAGNOSIS — H35379 Puckering of macula, unspecified eye: Secondary | ICD-10-CM | POA: Diagnosis present

## 2015-01-27 HISTORY — DX: Personal history of other infectious and parasitic diseases: Z86.19

## 2015-01-27 HISTORY — DX: Diarrhea, unspecified: R19.7

## 2015-01-27 HISTORY — PX: PERFLUORONE INJECTION: SHX5302

## 2015-01-27 HISTORY — PX: PHOTOCOAGULATION WITH LASER: SHX6027

## 2015-01-27 HISTORY — PX: REPAIR OF COMPLEX TRACTION RETINAL DETACHMENT: SHX6217

## 2015-01-27 HISTORY — PX: MEMBRANE PEEL: SHX5967

## 2015-01-27 HISTORY — DX: Unspecified osteoarthritis, unspecified site: M19.90

## 2015-01-27 HISTORY — PX: VITRECTOMY 25 GAUGE WITH SCLERAL BUCKLE: SHX6183

## 2015-01-27 HISTORY — PX: GAS INSERTION: SHX5336

## 2015-01-27 LAB — CBC
HEMATOCRIT: 44.9 % (ref 36.0–46.0)
Hemoglobin: 16 g/dL — ABNORMAL HIGH (ref 12.0–15.0)
MCH: 32.9 pg (ref 26.0–34.0)
MCHC: 35.6 g/dL (ref 30.0–36.0)
MCV: 92.4 fL (ref 78.0–100.0)
PLATELETS: 210 10*3/uL (ref 150–400)
RBC: 4.86 MIL/uL (ref 3.87–5.11)
RDW: 12.4 % (ref 11.5–15.5)
WBC: 8 10*3/uL (ref 4.0–10.5)

## 2015-01-27 LAB — BASIC METABOLIC PANEL
ANION GAP: 13 (ref 5–15)
BUN: 8 mg/dL (ref 6–20)
CHLORIDE: 97 mmol/L — AB (ref 101–111)
CO2: 26 mmol/L (ref 22–32)
CREATININE: 0.49 mg/dL (ref 0.44–1.00)
Calcium: 9.5 mg/dL (ref 8.9–10.3)
GFR calc Af Amer: >60 mL/min (ref >60)
GFR calc non Af Amer: >60 mL/min (ref >60)
Glucose, Bld: 93 mg/dL (ref 65–99)
POTASSIUM: 3.7 mmol/L (ref 3.5–5.1)
SODIUM: 136 mmol/L (ref 135–145)

## 2015-01-27 SURGERY — VITRECTOMY, USING 25-GAUGE INSTRUMENTS, WITH SCLERAL BUCKLING
Anesthesia: General | Site: Eye | Laterality: Right

## 2015-01-27 MED ORDER — NEOSTIGMINE METHYLSULFATE 10 MG/10ML IV SOLN
INTRAVENOUS | Status: DC | PRN
  Administered 2015-01-27: 4 mg via INTRAVENOUS

## 2015-01-27 MED ORDER — FENTANYL CITRATE (PF) 100 MCG/2ML IJ SOLN
INTRAMUSCULAR | Status: DC | PRN
  Administered 2015-01-27: 50 ug via INTRAVENOUS

## 2015-01-27 MED ORDER — OXYCODONE HCL 5 MG/5ML PO SOLN: 5.0000 mg | Freq: Once | ORAL | Status: AC | PRN

## 2015-01-27 MED ORDER — ARTIFICIAL TEARS OP OINT
TOPICAL_OINTMENT | OPHTHALMIC | Status: DC | PRN
  Administered 2015-01-27: 1 via OPHTHALMIC

## 2015-01-27 MED ORDER — NAPROXEN 250 MG PO TABS: 375.0000 mg | ORAL_TABLET | Freq: Two times a day (BID) | ORAL | Status: DC | PRN

## 2015-01-27 MED ORDER — LIDOCAINE HCL (CARDIAC) 20 MG/ML IV SOLN
INTRAVENOUS | Status: DC | PRN
  Administered 2015-01-27: 60 mg via INTRAVENOUS

## 2015-01-27 MED ORDER — ACETAZOLAMIDE SODIUM 500 MG IJ SOLR
INTRAMUSCULAR | Status: AC
Start: 2015-01-27 — End: 2015-01-27
  Filled 2015-01-27: qty 500

## 2015-01-27 MED ORDER — OXCARBAZEPINE 150 MG PO TABS
150.0000 mg | ORAL_TABLET | Freq: Two times a day (BID) | ORAL | Status: DC
  Filled 2015-01-27 (×3): qty 1

## 2015-01-27 MED ORDER — ATROPINE SULFATE 1 % OP SOLN
OPHTHALMIC | Status: AC
  Filled 2015-01-27: qty 5

## 2015-01-27 MED ORDER — PREDNISOLONE ACETATE 1 % OP SUSP
1.0000 [drp] | Freq: Four times a day (QID) | OPHTHALMIC | Status: DC
  Filled 2015-01-27: qty 5

## 2015-01-27 MED ORDER — PROPOFOL 10 MG/ML IV BOLUS
INTRAVENOUS | Status: AC
  Filled 2015-01-27: qty 20

## 2015-01-27 MED ORDER — GLYCOPYRROLATE 0.2 MG/ML IJ SOLN
INTRAMUSCULAR | Status: AC
  Filled 2015-01-27: qty 3

## 2015-01-27 MED ORDER — BUPIVACAINE HCL (PF) 0.75 % IJ SOLN
INTRAMUSCULAR | Status: AC
Start: 2015-01-27 — End: 2015-01-27
  Filled 2015-01-27: qty 10

## 2015-01-27 MED ORDER — HYPROMELLOSE (GONIOSCOPIC) 2.5 % OP SOLN
OPHTHALMIC | Status: AC
Start: 2015-01-27 — End: 2015-01-27
  Filled 2015-01-27: qty 15

## 2015-01-27 MED ORDER — AMLODIPINE BESYLATE 5 MG PO TABS
5.0000 mg | ORAL_TABLET | Freq: Every day | ORAL | Status: DC
  Filled 2015-01-27: qty 1

## 2015-01-27 MED ORDER — ACETAMINOPHEN 325 MG PO TABS: 325.0000 mg | ORAL_TABLET | ORAL | Status: DC | PRN

## 2015-01-27 MED ORDER — FENTANYL CITRATE (PF) 100 MCG/2ML IJ SOLN
INTRAMUSCULAR | Status: AC
  Filled 2015-01-27: qty 2

## 2015-01-27 MED ORDER — MIDAZOLAM HCL 5 MG/5ML IJ SOLN
INTRAMUSCULAR | Status: DC | PRN
  Administered 2015-01-27: 0.5 mg via INTRAVENOUS

## 2015-01-27 MED ORDER — ASPIRIN EC 81 MG PO TBEC: 81.0000 mg | DELAYED_RELEASE_TABLET | Freq: Every day | ORAL | Status: DC

## 2015-01-27 MED ORDER — NEOSTIGMINE METHYLSULFATE 10 MG/10ML IV SOLN
INTRAVENOUS | Status: AC
  Filled 2015-01-27: qty 1

## 2015-01-27 MED ORDER — SODIUM CHLORIDE 0.9 % IJ SOLN
INTRAMUSCULAR | Status: AC
  Filled 2015-01-27: qty 10

## 2015-01-27 MED ORDER — PROPOFOL 10 MG/ML IV BOLUS
INTRAVENOUS | Status: AC
Start: 2015-01-27 — End: 2015-01-27
  Filled 2015-01-27: qty 20

## 2015-01-27 MED ORDER — SODIUM CHLORIDE 0.45 % IV SOLN
INTRAVENOUS | Status: DC
  Administered 2015-01-27: 20:00:00 via INTRAVENOUS

## 2015-01-27 MED ORDER — FENTANYL CITRATE (PF) 100 MCG/2ML IJ SOLN
25.0000 ug | INTRAMUSCULAR | Status: DC | PRN
  Administered 2015-01-27 (×4): 50 ug via INTRAVENOUS

## 2015-01-27 MED ORDER — SODIUM HYALURONATE 10 MG/ML IO SOLN
INTRAOCULAR | Status: AC
  Filled 2015-01-27: qty 0.85

## 2015-01-27 MED ORDER — CEFAZOLIN SODIUM-DEXTROSE 2-3 GM-% IV SOLR
INTRAVENOUS | Status: AC
  Filled 2015-01-27: qty 50

## 2015-01-27 MED ORDER — PHENYLEPHRINE HCL 2.5 % OP SOLN
1.0000 [drp] | OPHTHALMIC | Status: AC
  Administered 2015-01-27 (×3): 1 [drp] via OPHTHALMIC
  Filled 2015-01-27: qty 2

## 2015-01-27 MED ORDER — TROPICAMIDE 1 % OP SOLN
1.0000 [drp] | OPHTHALMIC | Status: AC
  Administered 2015-01-27 (×3): 1 [drp] via OPHTHALMIC
  Filled 2015-01-27: qty 3

## 2015-01-27 MED ORDER — ONDANSETRON HCL 4 MG/2ML IJ SOLN
INTRAMUSCULAR | Status: AC
  Filled 2015-01-27: qty 2

## 2015-01-27 MED ORDER — PREDNISOLONE ACETATE 1 % OP SUSP: 1.0000 [drp] | Freq: Every day | OPHTHALMIC | Status: DC

## 2015-01-27 MED ORDER — TETRACAINE HCL 0.5 % OP SOLN
2.0000 [drp] | Freq: Once | OPHTHALMIC | Status: DC
  Filled 2015-01-27: qty 2

## 2015-01-27 MED ORDER — DEXAMETHASONE SODIUM PHOSPHATE 10 MG/ML IJ SOLN
INTRAMUSCULAR | Status: AC
  Filled 2015-01-27: qty 1

## 2015-01-27 MED ORDER — EPINEPHRINE HCL 1 MG/ML IJ SOLN
INTRAOCULAR | Status: DC | PRN
  Administered 2015-01-27: 500 mL

## 2015-01-27 MED ORDER — METOPROLOL SUCCINATE ER 25 MG PO TB24
25.0000 mg | ORAL_TABLET | Freq: Every day | ORAL | Status: DC
  Filled 2015-01-27: qty 1

## 2015-01-27 MED ORDER — ARTIFICIAL TEARS OP OINT
TOPICAL_OINTMENT | OPHTHALMIC | Status: AC
  Filled 2015-01-27: qty 3.5

## 2015-01-27 MED ORDER — PRAVASTATIN SODIUM 10 MG PO TABS: 10.0000 mg | ORAL_TABLET | Freq: Every day | ORAL | Status: DC

## 2015-01-27 MED ORDER — BRIMONIDINE TARTRATE 0.2 % OP SOLN
1.0000 [drp] | Freq: Two times a day (BID) | OPHTHALMIC | Status: DC
  Filled 2015-01-27: qty 5

## 2015-01-27 MED ORDER — ROCURONIUM BROMIDE 50 MG/5ML IV SOLN
INTRAVENOUS | Status: AC
  Filled 2015-01-27: qty 1

## 2015-01-27 MED ORDER — BUPIVACAINE HCL (PF) 0.75 % IJ SOLN
INTRAMUSCULAR | Status: DC | PRN
  Administered 2015-01-27: 10 mL

## 2015-01-27 MED ORDER — FENTANYL CITRATE (PF) 250 MCG/5ML IJ SOLN
INTRAMUSCULAR | Status: AC
Start: 2015-01-27 — End: 2015-01-27
  Filled 2015-01-27: qty 5

## 2015-01-27 MED ORDER — TEMAZEPAM 15 MG PO CAPS: 15.0000 mg | ORAL_CAPSULE | Freq: Every evening | ORAL | Status: DC | PRN

## 2015-01-27 MED ORDER — PRENATAL MULTIVITAMIN CH
1.0000 | ORAL_TABLET | Freq: Every day | ORAL | Status: DC
  Filled 2015-01-27 (×2): qty 1

## 2015-01-27 MED ORDER — MIDAZOLAM HCL 2 MG/2ML IJ SOLN
INTRAMUSCULAR | Status: AC
  Filled 2015-01-27: qty 2

## 2015-01-27 MED ORDER — ACETAZOLAMIDE SODIUM 500 MG IJ SOLR
500.0000 mg | Freq: Once | INTRAMUSCULAR | Status: AC
  Administered 2015-01-28: 500 mg via INTRAVENOUS
  Filled 2015-01-27: qty 500

## 2015-01-27 MED ORDER — LIDOCAINE HCL 2 % IJ SOLN
INTRAMUSCULAR | Status: AC
  Filled 2015-01-27: qty 20

## 2015-01-27 MED ORDER — BSS IO SOLN
INTRAOCULAR | Status: AC
  Filled 2015-01-27: qty 15

## 2015-01-27 MED ORDER — MAGNESIUM HYDROXIDE 400 MG/5ML PO SUSP: 15.0000 mL | Freq: Four times a day (QID) | ORAL | Status: DC | PRN

## 2015-01-27 MED ORDER — ATROPINE SULFATE 1 % OP SOLN
OPHTHALMIC | Status: DC | PRN
  Administered 2015-01-27: 2 [drp] via OPHTHALMIC

## 2015-01-27 MED ORDER — GENTAMICIN SULFATE 40 MG/ML IJ SOLN
INTRAMUSCULAR | Status: AC
  Filled 2015-01-27: qty 2

## 2015-01-27 MED ORDER — OXYBUTYNIN CHLORIDE ER 5 MG PO TB24
5.0000 mg | ORAL_TABLET | Freq: Every day | ORAL | Status: DC
  Filled 2015-01-27: qty 1

## 2015-01-27 MED ORDER — LACTATED RINGERS IV SOLN
INTRAVENOUS | Status: DC | PRN
  Administered 2015-01-27: 17:00:00 via INTRAVENOUS

## 2015-01-27 MED ORDER — OXYCODONE HCL 5 MG PO TABS
ORAL_TABLET | ORAL | Status: AC
  Filled 2015-01-27: qty 1

## 2015-01-27 MED ORDER — CYCLOPENTOLATE HCL 1 % OP SOLN
1.0000 [drp] | OPHTHALMIC | Status: AC
  Administered 2015-01-27 (×3): 1 [drp] via OPHTHALMIC
  Filled 2015-01-27: qty 2

## 2015-01-27 MED ORDER — EPINEPHRINE HCL 1 MG/ML IJ SOLN
INTRAMUSCULAR | Status: AC
  Filled 2015-01-27: qty 1

## 2015-01-27 MED ORDER — SODIUM CHLORIDE 0.9 % IV SOLN
INTRAVENOUS | Status: DC
  Administered 2015-01-27 (×2): via INTRAVENOUS

## 2015-01-27 MED ORDER — TRIAMCINOLONE ACETONIDE 40 MG/ML IJ SUSP
INTRAMUSCULAR | Status: AC
  Filled 2015-01-27: qty 5

## 2015-01-27 MED ORDER — LIDOCAINE HCL (CARDIAC) 20 MG/ML IV SOLN
INTRAVENOUS | Status: AC
  Filled 2015-01-27: qty 5

## 2015-01-27 MED ORDER — ONDANSETRON HCL 4 MG/2ML IJ SOLN
4.0000 mg | Freq: Four times a day (QID) | INTRAMUSCULAR | Status: DC | PRN
  Administered 2015-01-27: 4 mg via INTRAVENOUS
  Filled 2015-01-27: qty 2

## 2015-01-27 MED ORDER — SODIUM CHLORIDE 0.9 % IJ SOLN
INTRAMUSCULAR | Status: DC | PRN
  Administered 2015-01-27: 11 mL

## 2015-01-27 MED ORDER — ONDANSETRON HCL 4 MG/2ML IJ SOLN
INTRAMUSCULAR | Status: DC | PRN
  Administered 2015-01-27: 4 mg via INTRAVENOUS

## 2015-01-27 MED ORDER — GATIFLOXACIN 0.5 % OP SOLN
1.0000 [drp] | OPHTHALMIC | Status: AC
  Administered 2015-01-27 (×3): 1 [drp] via OPHTHALMIC
  Filled 2015-01-27: qty 2.5

## 2015-01-27 MED ORDER — GATIFLOXACIN 0.5 % OP SOLN
1.0000 [drp] | Freq: Four times a day (QID) | OPHTHALMIC | Status: DC
  Filled 2015-01-27: qty 2.5

## 2015-01-27 MED ORDER — PROPOFOL 10 MG/ML IV BOLUS
INTRAVENOUS | Status: DC | PRN
  Administered 2015-01-27: 120 mg via INTRAVENOUS

## 2015-01-27 MED ORDER — POLYMYXIN B SULFATE 500000 UNITS IJ SOLR
INTRAMUSCULAR | Status: AC
Start: 2015-01-27 — End: 2015-01-27
  Filled 2015-01-27: qty 1

## 2015-01-27 MED ORDER — HYDROCHLOROTHIAZIDE 25 MG PO TABS: 25.0000 mg | ORAL_TABLET | Freq: Every day | ORAL | Status: DC

## 2015-01-27 MED ORDER — GLYCOPYRROLATE 0.2 MG/ML IJ SOLN
INTRAMUSCULAR | Status: DC | PRN
  Administered 2015-01-27: 0.6 mg via INTRAVENOUS

## 2015-01-27 MED ORDER — PHENYLEPHRINE HCL 10 MG/ML IJ SOLN
10.0000 mg | INTRAVENOUS | Status: DC | PRN
  Administered 2015-01-27: 40 ug/min via INTRAVENOUS

## 2015-01-27 MED ORDER — BACITRACIN-POLYMYXIN B 500-10000 UNIT/GM OP OINT
1.0000 "application " | TOPICAL_OINTMENT | Freq: Three times a day (TID) | OPHTHALMIC | Status: DC
  Filled 2015-01-27: qty 3.5

## 2015-01-27 MED ORDER — LATANOPROST 0.005 % OP SOLN
1.0000 [drp] | Freq: Every day | OPHTHALMIC | Status: DC
  Filled 2015-01-27: qty 2.5

## 2015-01-27 MED ORDER — BACITRACIN-POLYMYXIN B 500-10000 UNIT/GM OP OINT
TOPICAL_OINTMENT | OPHTHALMIC | Status: DC | PRN
  Administered 2015-01-27: 1 via OPHTHALMIC

## 2015-01-27 MED ORDER — DEXAMETHASONE SODIUM PHOSPHATE 10 MG/ML IJ SOLN
INTRAMUSCULAR | Status: DC | PRN
  Administered 2015-01-27: 10 mg

## 2015-01-27 MED ORDER — OXYCODONE HCL 5 MG PO TABS
5.0000 mg | ORAL_TABLET | Freq: Once | ORAL | Status: AC | PRN
  Administered 2015-01-27: 5 mg via ORAL

## 2015-01-27 MED ORDER — ONDANSETRON HCL 4 MG/2ML IJ SOLN: 4.0000 mg | Freq: Four times a day (QID) | INTRAMUSCULAR | Status: DC | PRN

## 2015-01-27 MED ORDER — MORPHINE SULFATE 2 MG/ML IJ SOLN: 1.0000 mg | INTRAMUSCULAR | Status: DC | PRN

## 2015-01-27 MED ORDER — SODIUM HYALURONATE 10 MG/ML IO SOLN
INTRAOCULAR | Status: DC | PRN
  Administered 2015-01-27: 0.85 mL via INTRAOCULAR

## 2015-01-27 MED ORDER — 0.9 % SODIUM CHLORIDE (POUR BTL) OPTIME
TOPICAL | Status: DC | PRN
  Administered 2015-01-27: 1000 mL

## 2015-01-27 MED ORDER — HYDROCODONE-ACETAMINOPHEN 5-325 MG PO TABS: 1.0000 | ORAL_TABLET | ORAL | Status: DC | PRN

## 2015-01-27 MED ORDER — DEXTROSE 5 % IV SOLN
INTRAVENOUS | Status: DC | PRN
  Administered 2015-01-27: 14:00:00 via INTRAVENOUS

## 2015-01-27 MED ORDER — BSS PLUS IO SOLN
INTRAOCULAR | Status: AC
  Filled 2015-01-27: qty 500

## 2015-01-27 MED ORDER — ROCURONIUM BROMIDE 100 MG/10ML IV SOLN
INTRAVENOUS | Status: DC | PRN
  Administered 2015-01-27: 5 mg via INTRAVENOUS
  Administered 2015-01-27: 35 mg via INTRAVENOUS

## 2015-01-27 MED ORDER — BACITRACIN-POLYMYXIN B 500-10000 UNIT/GM OP OINT
TOPICAL_OINTMENT | OPHTHALMIC | Status: AC
Start: 2015-01-27 — End: 2015-01-27
  Filled 2015-01-27: qty 3.5

## 2015-01-27 SURGICAL SUPPLY — 81 items
APL SRG 3 HI ABS STRL LF PLS (MISCELLANEOUS) ×16
APPLICATOR DR MATTHEWS STRL (MISCELLANEOUS) ×32 IMPLANT
BALL CTTN LRG ABS STRL LF (GAUZE/BANDAGES/DRESSINGS) ×6
BAND CIRCLING RETINAL 2.5 (Ophthalmic Related) IMPLANT
BAND RTNL 125X2.5X.6 CRC (Ophthalmic Related) ×2 IMPLANT
BANDAGE EYE OVAL (MISCELLANEOUS) IMPLANT
BLADE EYE CATARACT 19 1.4 BEAV (BLADE) ×3 IMPLANT
CANNULA DUAL BORE 23G (CANNULA) ×3 IMPLANT
CANNULA VLV SOFT TIP 25G (OPHTHALMIC) IMPLANT
CANNULA VLV SOFT TIP 25GA (OPHTHALMIC) ×4 IMPLANT
CORDS BIPOLAR (ELECTRODE) IMPLANT
COTTONBALL LRG STERILE PKG (GAUZE/BANDAGES/DRESSINGS) ×12 IMPLANT
COVER SURGICAL LIGHT HANDLE (MISCELLANEOUS) ×4 IMPLANT
DRAPE OPHTHALMIC 77X100 STRL (CUSTOM PROCEDURE TRAY) ×4 IMPLANT
DUALBORE SIDEFLO CANNULA 25G ×3 IMPLANT
ERASER HMR WETFIELD 23G BP (MISCELLANEOUS) IMPLANT
FILTER BLUE MILLIPORE (MISCELLANEOUS) ×2 IMPLANT
FILTER STRAW FLUID ASPIR (MISCELLANEOUS) IMPLANT
FORCEPS GRIESHABER ILM 25G A (INSTRUMENTS) ×3 IMPLANT
GAS AUTO FILL CONSTEL (OPHTHALMIC) ×4
GAS AUTO FILL CONSTELLATION (OPHTHALMIC) ×1 IMPLANT
GAS OPHTHALMIC (MISCELLANEOUS) IMPLANT
GLOVE SS BIOGEL STRL SZ 6.5 (GLOVE) ×2 IMPLANT
GLOVE SS BIOGEL STRL SZ 7 (GLOVE) ×2 IMPLANT
GLOVE SUPERSENSE BIOGEL SZ 6.5 (GLOVE) ×2
GLOVE SUPERSENSE BIOGEL SZ 7 (GLOVE) ×2
GLOVE SURG 8.5 LATEX PF (GLOVE) ×7 IMPLANT
GLOVE SURG SS PI 6.5 STRL IVOR (GLOVE) ×3 IMPLANT
GLOVE SURG SS PI 7.0 STRL IVOR (GLOVE) ×3 IMPLANT
GOWN STRL REUS W/ TWL LRG LVL3 (GOWN DISPOSABLE) ×6 IMPLANT
GOWN STRL REUS W/TWL LRG LVL3 (GOWN DISPOSABLE) ×16
HANDLE PNEUMATIC FOR CONSTEL (OPHTHALMIC) ×3 IMPLANT
IMPL SILICONE (Ophthalmic Related) ×2 IMPLANT
IMPLANT SILICONE (Ophthalmic Related) ×8 IMPLANT
KIT BASIN OR (CUSTOM PROCEDURE TRAY) ×4 IMPLANT
KIT PERFLUORON PROCEDURE 5ML (MISCELLANEOUS) ×3 IMPLANT
KIT ROOM TURNOVER OR (KITS) ×4 IMPLANT
KNIFE CRESCENT 1.75 EDGEAHEAD (BLADE) IMPLANT
KNIFE GRIESHABER SHARP 2.5MM (MISCELLANEOUS) ×12 IMPLANT
MICROPICK 25G (MISCELLANEOUS) ×4
NDL 18GX1X1/2 (RX/OR ONLY) (NEEDLE) ×1 IMPLANT
NDL 25GX 5/8IN NON SAFETY (NEEDLE) IMPLANT
NDL FILTER BLUNT 18X1 1/2 (NEEDLE) ×1 IMPLANT
NDL HYPO 30X.5 LL (NEEDLE) ×2 IMPLANT
NEEDLE 18GX1X1/2 (RX/OR ONLY) (NEEDLE) ×4 IMPLANT
NEEDLE 25GX 5/8IN NON SAFETY (NEEDLE) IMPLANT
NEEDLE 27GAX1X1/2 (NEEDLE) IMPLANT
NEEDLE FILTER BLUNT 18X 1/2SAF (NEEDLE) ×2
NEEDLE FILTER BLUNT 18X1 1/2 (NEEDLE) ×2 IMPLANT
NEEDLE HYPO 30X.5 LL (NEEDLE) ×8 IMPLANT
NS IRRIG 1000ML POUR BTL (IV SOLUTION) ×4 IMPLANT
PACK VITRECTOMY CUSTOM (CUSTOM PROCEDURE TRAY) ×4 IMPLANT
PAD ARMBOARD 7.5X6 YLW CONV (MISCELLANEOUS) ×8 IMPLANT
PAK VITRECTOMY PIK 25 GA (OPHTHALMIC RELATED) ×3 IMPLANT
PIC ILLUMINATED 25G (OPHTHALMIC) ×4
PICK MICROPICK 25G (MISCELLANEOUS) ×1 IMPLANT
PIK ILLUMINATED 25G (OPHTHALMIC) ×1 IMPLANT
PROBE LASER ILLUM FLEX CVD 25G (OPHTHALMIC) ×3 IMPLANT
ROLLS DENTAL (MISCELLANEOUS) ×8 IMPLANT
SCRAPER DIAMOND 25GA (OPHTHALMIC RELATED) ×3 IMPLANT
SET FLUID INJECTOR (SET/KITS/TRAYS/PACK) IMPLANT
SLEEVE SCLERAL TYPE 270 (Ophthalmic Related) ×3 IMPLANT
SPEAR EYE SURG WECK-CEL (MISCELLANEOUS) ×12 IMPLANT
SPONGE SURGIFOAM ABS GEL 12-7 (HEMOSTASIS) ×3 IMPLANT
SUT CHROMIC 7 0 TG140 8 (SUTURE) ×4 IMPLANT
SUT ETHILON 9 0 TG140 8 (SUTURE) IMPLANT
SUT MERSILENE 4 0 RV 2 (SUTURE) ×8 IMPLANT
SUT SILK 2 0 (SUTURE) ×4
SUT SILK 2-0 18XBRD TIE 12 (SUTURE) ×2 IMPLANT
SUT SILK 4 0 RB 1 (SUTURE) ×4 IMPLANT
SUT VICRYL 7 0 TG140 8 (SUTURE) IMPLANT
SYR 20CC LL (SYRINGE) ×4 IMPLANT
SYR 5ML LL (SYRINGE) ×1 IMPLANT
SYR BULB 3OZ (MISCELLANEOUS) ×4 IMPLANT
SYR TB 1ML LUER SLIP (SYRINGE) ×3 IMPLANT
SYRINGE 10CC LL (SYRINGE) ×1 IMPLANT
TIRE RTNL 2.5XGRV CNCV 9X (Ophthalmic Related) ×1 IMPLANT
TOWEL OR 17X26 10 PK STRL BLUE (TOWEL DISPOSABLE) ×3 IMPLANT
TUBING HIGH PRESS EXTEN 6IN (TUBING) ×3 IMPLANT
WATER STERILE IRR 1000ML POUR (IV SOLUTION) ×4 IMPLANT
WIPE INSTRUMENT VISIWIPE 73X73 (MISCELLANEOUS) ×4 IMPLANT

## 2015-01-27 NOTE — H&P (Signed)
I examined the patient today and there is no change in the medical status 

## 2015-01-27 NOTE — Anesthesia Procedure Notes (Signed)
Procedure Name: Intubation Date/Time: 01/27/2015 1:25 PM Performed by: Williemae Area B Pre-anesthesia Checklist: Emergency Drugs available, Suction available, Patient being monitored and Patient identified Patient Re-evaluated:Patient Re-evaluated prior to inductionOxygen Delivery Method: Circle system utilized Preoxygenation: Pre-oxygenation with 100% oxygen Intubation Type: IV induction Ventilation: Mask ventilation without difficulty Laryngoscope Size: Mac and 3 Grade View: Grade II Tube type: Oral Tube size: 7.5 mm Number of attempts: 1 Airway Equipment and Method: Stylet Placement Confirmation: ETT inserted through vocal cords under direct vision,  breath sounds checked- equal and bilateral and positive ETCO2 Secured at: 21 (cm at gums) cm Tube secured with: Tape Dental Injury: Teeth and Oropharynx as per pre-operative assessment

## 2015-01-27 NOTE — Anesthesia Preprocedure Evaluation (Addendum)
Anesthesia Evaluation  Patient identified by MRN, date of birth, ID band Patient awake    Reviewed: Allergy & Precautions, NPO status , Patient's Chart, lab work & pertinent test results  Airway Mallampati: II  TM Distance: <3 FB Neck ROM: full    Dental  (+) Edentulous Upper, Edentulous Lower, Dental Advisory Given   Pulmonary  breath sounds clear to auscultation        Cardiovascular hypertension, Pt. on medications and Pt. on home beta blockers Rhythm:regular Rate:Normal     Neuro/Psych Depression  Neuromuscular disease    GI/Hepatic   Endo/Other  obese  Renal/GU      Musculoskeletal  (+) Arthritis -,   Abdominal   Peds  Hematology   Anesthesia Other Findings Dentures out to labelled cup to husband  Reproductive/Obstetrics                          Anesthesia Physical Anesthesia Plan  ASA: II  Anesthesia Plan: General   Post-op Pain Management:    Induction: Intravenous  Airway Management Planned: Oral ETT  Additional Equipment:   Intra-op Plan:   Post-operative Plan: Extubation in OR  Informed Consent: I have reviewed the patients History and Physical, chart, labs and discussed the procedure including the risks, benefits and alternatives for the proposed anesthesia with the patient or authorized representative who has indicated his/her understanding and acceptance.     Plan Discussed with: CRNA, Anesthesiologist and Surgeon  Anesthesia Plan Comments:         Anesthesia Quick Evaluation

## 2015-01-27 NOTE — Brief Op Note (Signed)
Brief Operative note   Preoperative diagnosis:  Retinal detachment right eye; Pre-retinal membrane right eye Postoperative diagnosis  * No Diagnosis Codes entered *  Procedures: Repair of complex retinal detachment with scleral buckle, membrane peel, vitrectomy, laser, PFO and C3F8 injection, right eye   Surgeon:  Hayden Pedro, MD...  Assistant:  Deatra Ina SA  Anesthesia: General  Specimen: none  Estimated blood loss:  1cc  Complications: none  Patient sent to PACU in good condition  Composed by Hayden Pedro MD  Dictation number: [id number]

## 2015-01-27 NOTE — Anesthesia Postprocedure Evaluation (Signed)
  Anesthesia Post-op Note  Patient: Terri Liu  Procedure(s) Performed: Procedure(s) with comments: VITRECTOMY 25 GAUGE WITH SCLERAL BUCKLE (Right) MEMBRANE PEEL (Right) PHOTOCOAGULATION WITH LASER (Right) - ENDOLASER INSERTION OF GAS (Right) - C3F8 PERFLUORONE INJECTION (Right) REPAIR OF COMPLEX TRACTION RETINAL DETACHMENT (Right)  Patient Location: PACU  Anesthesia Type:General  Level of Consciousness: awake, alert , oriented and patient cooperative  Airway and Oxygen Therapy: Patient Spontanous Breathing and Patient connected to nasal cannula oxygen  Post-op Pain: none  Post-op Assessment: Post-op Vital signs reviewed, Patient's Cardiovascular Status Stable, Respiratory Function Stable, Patent Airway, No signs of Nausea or vomiting and Pain level controlled  Post-op Vital Signs: Reviewed and stable  Last Vitals:  Filed Vitals:   01/27/15 1800  BP: 144/74  Pulse: 84  Temp:   Resp: 14    Complications: No apparent anesthesia complications

## 2015-01-27 NOTE — Transfer of Care (Signed)
Immediate Anesthesia Transfer of Care Note  Patient: Terri Liu  Procedure(s) Performed: Procedure(s) with comments: VITRECTOMY 25 GAUGE WITH SCLERAL BUCKLE (Right) MEMBRANE PEEL (Right) PHOTOCOAGULATION WITH LASER (Right) - ENDOLASER INSERTION OF GAS (Right) - C3F8 PERFLUORONE INJECTION (Right) REPAIR OF COMPLEX TRACTION RETINAL DETACHMENT (Right)  Patient Location: PACU  Anesthesia Type:General  Level of Consciousness: awake, oriented and patient cooperative  Airway & Oxygen Therapy: Patient Spontanous Breathing and Patient connected to nasal cannula oxygen  Post-op Assessment: Report given to RN, Post -op Vital signs reviewed and stable and Patient moving all extremities  Post vital signs: Reviewed and stable  Last Vitals:  Filed Vitals:   01/27/15 0908  Pulse: 88  Temp: 36.6 C  Resp: 16    Complications: No apparent anesthesia complications

## 2015-01-28 ENCOUNTER — Encounter (HOSPITAL_COMMUNITY): Payer: Self-pay | Admitting: Ophthalmology

## 2015-01-28 DIAGNOSIS — H33001 Unspecified retinal detachment with retinal break, right eye: Secondary | ICD-10-CM | POA: Diagnosis not present

## 2015-01-28 MED ORDER — ATROPINE SULFATE 1 % OP SOLN
1.0000 [drp] | Freq: Two times a day (BID) | OPHTHALMIC | Status: DC
Start: 2015-01-28 — End: 2016-08-15

## 2015-01-28 MED ORDER — HYDROCODONE-ACETAMINOPHEN 5-325 MG PO TABS: 1.0000 | ORAL_TABLET | ORAL | Status: DC | PRN

## 2015-01-28 MED ORDER — PREDNISOLONE ACETATE 1 % OP SUSP: 1.0000 [drp] | Freq: Four times a day (QID) | OPHTHALMIC | Status: DC

## 2015-01-28 MED ORDER — BRIMONIDINE TARTRATE 0.2 % OP SOLN: 1.0000 [drp] | Freq: Two times a day (BID) | OPHTHALMIC | Status: DC

## 2015-01-28 MED ORDER — ATROPINE SULFATE 1 % OP SOLN: 1.0000 [drp] | Freq: Two times a day (BID) | OPHTHALMIC | Status: DC

## 2015-01-28 MED ORDER — BACITRACIN-POLYMYXIN B 500-10000 UNIT/GM OP OINT: 1.0000 "application " | TOPICAL_OINTMENT | Freq: Three times a day (TID) | OPHTHALMIC | Status: DC

## 2015-01-28 MED ORDER — GATIFLOXACIN 0.5 % OP SOLN: 1.0000 [drp] | Freq: Four times a day (QID) | OPHTHALMIC | Status: DC

## 2015-01-28 NOTE — Op Note (Signed)
NAME:  Terri Liu, Terri Liu                 ACCOUNT NO.:  [id number]  MEDICAL RECORD NO.:  [id number]  LOCATION:  6N31C                        FACILITY:  [facility]  PHYSICIAN:  Chrystie Nose. Zigmund Daniel, M.D. DATE OF BIRTH:  24-Jun-1934  DATE OF PROCEDURE:  01/27/2015 DATE OF DISCHARGE:                              OPERATIVE REPORT   ADMISSION DIAGNOSIS:  Rhegmatogenous retinal detachment, preretinal fibrosis, right eye.  PROCEDURES:  Repair of complex retinal detachment with scleral buckle, pars plana vitrectomy, retinal photocoagulation, membrane peel, gas- fluid exchange, Perfluoron injection, Perfluoron removal, all in the right eye.  SURGEON:  Chrystie Nose. Zigmund Daniel, M.D.  ASSISTANT:  Deatra Ina, SA.  ANESTHESIA:  General.  DESCRIPTION OF PROCEDURE:  After usual prep and drape, 360 degree limbal peritomy,M isolation of 4 rectus muscles on 2-0 silk, scleral dissection for 360 degrees to admit a #279 intrascleral implant with 1 mm trim from the posterior edge.  Diathermy placed in the bed.  The 279 implant was placed around the globe with a joint at 2 o'clock.  240 band was placed around the globe with a 270 sleeve at 2 o'clock.  The buckle was adjusted and trimmed.  The band was adjusted and trimmed.  The attention was carried to the pars plana where 25-gauge trocars were placed at 8, 10, and 2 o'clock, and 25-gauge infusion at 8 o'clock.  The contact lens ring was anchored into place at 6 and 12 o'clock.  Provisc placed on the corneal surface and the flat contact lens was placed.  Pars plana vitrectomy in a core fashion was performed with removal of dense debris and large white dots and the small yellow dots from the vitreous cavity. The vitrectomy was carried down to the macular surface where the macula was thrown into folds from preretinal fibrosis.  The membrane was attacked with the lighted pick with a 25-gauge pick with the diamond- dusted membrane scraper and also intra-ocular automated  forceps.  The membrane was peeled under direct visualization with the magnifying contact lens.  Once the entire membrane was removed, the attention was carried to the mid periphery where the BIOM viewing system was moved into place.  Contact lens ring and contact lenses were removed. Additional Provisc placed on the corneal surface.  The vitrectomy was carried in the mid periphery where surface proliferation was removed with the vitreous cutter.  The vitrectomy was carried in the far periphery where multiple areas of previously treated retinal breaks were seen lying on the scleral buckle.  Perforation site was chosen externally at 10 o'clock.  A large amount of clear colorless subretinal fluid came forth in a controlled fashion.  The retina became reattached internally at this point.  Perfluoron was injected into the vitreous cavity to reattach the retina to the buckle, so that laser could be performed.  The endolaser was positioned in the eye, 1558 burns were placed around retinal breaks on the scleral buckle.  The power was 400 mW, 1000 microns each, and 0.1 seconds each in being.  A total of retinal re-attachment was obtained at this point.  Perfluoron was removed and a sterile gas was used to replace it.  A washout procedure was  performed to ensure that all PFO was removed.  The attention was carried externally where the band was adjusted again and the buckle was adjusted again and trimmed.  Scleral sutures were drawn securely knotted and the free ends removed.  The intravitreal gas was exchanged for 10% C3F8.  Again, the retina was fully reattached at this point.  The instruments were removed from the eye.  The conjunctiva was reposited with 7-0 chromic suture.  Polymyxin and gentamicin were irrigated into tenon space.  Atropine solution was applied.  Marcaine was injected around the globe for postop pain.  Decadron 10 mg was injected into the lower subconjunctival space.  Closing  pressure was 10 with a Barraquer tonometer.  Complications none.  The patient was awakened and taken to recovery in satisfactory condition.  OPERATIVE TIME:  3 hours.     Chrystie Nose. Zigmund Daniel, M.D.     JDM/MEDQ  D:  01/27/2015  T:  01/28/2015  Job:  [id number]

## 2015-01-28 NOTE — Progress Notes (Addendum)
01/28/2015, 6:39 AM  Mental Status:  Awake, Alert, Oriented  Anterior segment: Cornea  Clear    Anterior Chamber Clear    Lens:   IOL,   Intra Ocular Pressure 26 mmHg with Tonopen  Vitreous: Clear 95%gas bubble   Retina:  Attached Good laser reaction   Impression: Excellent result Retina attached   Final Diagnosis: Principal Problem:   Rhegmatogenous retinal detachment of right eye Active Problems:   Preretinal fibrosis, right eye   Epiretinal membrane   Plan: start post operative eye drops.   Add alphagan.  Give one drop of Atropine at bedside.   Discharge to home.  Give post operative instructions  Terri Liu 01/28/2015, 6:39 AM

## 2015-01-28 NOTE — Progress Notes (Signed)
Discharge home. Home discharge instruction given, no question verbalized. 

## 2015-01-28 NOTE — Op Note (Signed)
NAME:  AIMY, SWEETING                 ACCOUNT NO.:  [id number]  MEDICAL RECORD NO.:  [id number]  LOCATION:  6N31C                        FACILITY:  [place]  PHYSICIAN:  Chrystie Nose. Zigmund Daniel, M.D. DATE OF BIRTH:  1934-06-18  DATE OF PROCEDURE:  01/27/2015 DATE OF DISCHARGE:                              OPERATIVE REPORT   NO DICTATION.     Chrystie Nose. Zigmund Daniel, M.D.     JDM/MEDQ  D:  01/27/2015  T:  01/28/2015  Job:  [id number]

## 2015-02-02 ENCOUNTER — Encounter (HOSPITAL_COMMUNITY): Payer: Self-pay | Admitting: Ophthalmology

## 2015-02-03 ENCOUNTER — Inpatient Hospital Stay (INDEPENDENT_AMBULATORY_CARE_PROVIDER_SITE_OTHER): Payer: PPO | Admitting: Ophthalmology

## 2015-02-03 DIAGNOSIS — H338 Other retinal detachments: Secondary | ICD-10-CM

## 2015-02-25 ENCOUNTER — Encounter (INDEPENDENT_AMBULATORY_CARE_PROVIDER_SITE_OTHER): Payer: PPO | Admitting: Ophthalmology

## 2015-02-25 DIAGNOSIS — H338 Other retinal detachments: Secondary | ICD-10-CM

## 2015-05-06 ENCOUNTER — Encounter (INDEPENDENT_AMBULATORY_CARE_PROVIDER_SITE_OTHER): Payer: PPO | Admitting: Ophthalmology

## 2015-05-06 DIAGNOSIS — H338 Other retinal detachments: Secondary | ICD-10-CM

## 2015-05-06 DIAGNOSIS — H33302 Unspecified retinal break, left eye: Secondary | ICD-10-CM | POA: Diagnosis not present

## 2015-05-06 DIAGNOSIS — H43812 Vitreous degeneration, left eye: Secondary | ICD-10-CM

## 2015-05-06 DIAGNOSIS — I10 Essential (primary) hypertension: Secondary | ICD-10-CM | POA: Diagnosis not present

## 2015-05-06 DIAGNOSIS — H35033 Hypertensive retinopathy, bilateral: Secondary | ICD-10-CM

## 2015-10-13 DIAGNOSIS — M170 Bilateral primary osteoarthritis of knee: Secondary | ICD-10-CM | POA: Diagnosis not present

## 2015-10-13 DIAGNOSIS — M1712 Unilateral primary osteoarthritis, left knee: Secondary | ICD-10-CM | POA: Diagnosis not present

## 2015-10-13 DIAGNOSIS — M25562 Pain in left knee: Secondary | ICD-10-CM | POA: Diagnosis not present

## 2015-10-13 DIAGNOSIS — M7541 Impingement syndrome of right shoulder: Secondary | ICD-10-CM | POA: Diagnosis not present

## 2015-10-13 DIAGNOSIS — M25511 Pain in right shoulder: Secondary | ICD-10-CM | POA: Diagnosis not present

## 2015-10-15 DIAGNOSIS — R7301 Impaired fasting glucose: Secondary | ICD-10-CM | POA: Diagnosis not present

## 2015-10-15 DIAGNOSIS — I10 Essential (primary) hypertension: Secondary | ICD-10-CM | POA: Diagnosis not present

## 2015-10-15 DIAGNOSIS — E785 Hyperlipidemia, unspecified: Secondary | ICD-10-CM | POA: Diagnosis not present

## 2015-10-15 DIAGNOSIS — R8299 Other abnormal findings in urine: Secondary | ICD-10-CM | POA: Diagnosis not present

## 2015-10-15 DIAGNOSIS — G518 Other disorders of facial nerve: Secondary | ICD-10-CM | POA: Diagnosis not present

## 2015-10-15 DIAGNOSIS — Z01818 Encounter for other preprocedural examination: Secondary | ICD-10-CM | POA: Diagnosis not present

## 2015-10-15 DIAGNOSIS — M150 Primary generalized (osteo)arthritis: Secondary | ICD-10-CM | POA: Diagnosis not present

## 2015-10-19 DIAGNOSIS — E162 Hypoglycemia, unspecified: Secondary | ICD-10-CM | POA: Diagnosis not present

## 2015-10-22 DIAGNOSIS — E876 Hypokalemia: Secondary | ICD-10-CM | POA: Diagnosis not present

## 2015-11-02 DIAGNOSIS — R300 Dysuria: Secondary | ICD-10-CM | POA: Diagnosis not present

## 2015-11-23 ENCOUNTER — Ambulatory Visit: Payer: Self-pay | Admitting: Orthopedic Surgery

## 2015-12-08 ENCOUNTER — Ambulatory Visit: Payer: Self-pay | Admitting: Orthopedic Surgery

## 2015-12-08 NOTE — Patient Instructions (Addendum)
Terri Liu  12/08/2015   Your procedure is scheduled on: 12/17/2015    Report to [facility] Main  Entrance take [place]  elevators to 3rd floor to  [place] at    0800 AM.  Call this number if you have problems the morning of surgery [phone]   Remember: ONLY 1 PERSON MAY GO WITH YOU TO SHORT STAY TO GET  READY MORNING OF [place].  Do not eat food or drink liquids :After Midnight.     Take these medicines the morning of surgery with A SIP OF WATER: eyedrop                               You may not have any metal on your body including hair pins and              piercings  Do not wear jewelry, make-up, lotions, powders or perfumes, deodorant             Do not wear nail polish.  Do not shave  48 hours prior to surgery.                 Do not bring valuables to the hospital. [place].  Contacts, dentures or bridgework may not be worn into surgery.  Leave suitcase in the car. After surgery it may be brought to your room.         Special Instructions: coughing and deep breathing exercises, leg exercises               Please read over the following fact sheets you were given: _____________________________________________________________________             [facility] - Preparing for Surgery Before surgery, you can play an important role.  Because skin is not sterile, your skin needs to be as free of germs as possible.  You can reduce the number of germs on your skin by washing with CHG (chlorahexidine gluconate) soap before surgery.  CHG is an antiseptic cleaner which kills germs and bonds with the skin to continue killing germs even after washing. Please DO NOT use if you have an allergy to CHG or antibacterial soaps.  If your skin becomes reddened/irritated stop using the CHG and inform your nurse when you arrive at Short Stay. Do not shave (including legs and underarms) for at least 48  hours prior to the first CHG shower.  You may shave your face/neck. Please follow these instructions carefully:  1.  Shower with CHG Soap the night before surgery and the  morning of Surgery.  2.  If you choose to wash your hair, wash your hair first as usual with your  normal  shampoo.  3.  After you shampoo, rinse your hair and body thoroughly to remove the  shampoo.                           4.  Use CHG as you would any other liquid soap.  You can apply chg directly  to the skin and wash                       Gently  with a scrungie or clean washcloth.  5.  Apply the CHG Soap to your body ONLY FROM THE NECK DOWN.   Do not use on face/ open                           Wound or open sores. Avoid contact with eyes, ears mouth and genitals (private parts).                       Wash face,  Genitals (private parts) with your normal soap.             6.  Wash thoroughly, paying special attention to the area where your surgery  will be performed.  7.  Thoroughly rinse your body with warm water from the neck down.  8.  DO NOT shower/wash with your normal soap after using and rinsing off  the CHG Soap.                9.  Pat yourself dry with a clean towel.            10.  Wear clean pajamas.            11.  Place clean sheets on your bed the night of your first shower and do not  sleep with pets. Day of Surgery : Do not apply any lotions/deodorants the morning of surgery.  Please wear clean clothes to the hospital/surgery center.  FAILURE TO FOLLOW THESE INSTRUCTIONS MAY RESULT IN THE CANCELLATION OF YOUR SURGERY PATIENT SIGNATURE_________________________________  NURSE SIGNATURE__________________________________  ________________________________________________________________________  WHAT IS A BLOOD TRANSFUSION? Blood Transfusion Information  A transfusion is the replacement of blood or some of its parts. Blood is made up of multiple cells which provide different functions.  Red blood cells  carry oxygen and are used for blood loss replacement.  White blood cells fight against infection.  Platelets control bleeding.  Plasma helps clot blood.  Other blood products are available for specialized needs, such as hemophilia or other clotting disorders. BEFORE THE TRANSFUSION  Who gives blood for transfusions?   Healthy volunteers who are fully evaluated to make sure their blood is safe. This is blood bank blood. Transfusion therapy is the safest it has ever been in the practice of medicine. Before blood is taken from a donor, a complete history is taken to make sure that person has no history of diseases nor engages in risky social behavior (examples are intravenous drug use or sexual activity with multiple partners). The donor's travel history is screened to minimize risk of transmitting infections, such as malaria. The donated blood is tested for signs of infectious diseases, such as HIV and hepatitis. The blood is then tested to be sure it is compatible with you in order to minimize the chance of a transfusion reaction. If you or a relative donates blood, this is often done in anticipation of surgery and is not appropriate for emergency situations. It takes many days to process the donated blood. RISKS AND COMPLICATIONS Although transfusion therapy is very safe and saves many lives, the main dangers of transfusion include:  1. Getting an infectious disease. 2. Developing a transfusion reaction. This is an allergic reaction to something in the blood you were given. Every precaution is taken to prevent this. The decision to have a blood transfusion has been considered carefully by your caregiver before blood is given. Blood is not given unless the benefits outweigh the  risks. AFTER THE TRANSFUSION  Right after receiving a blood transfusion, you will usually feel much better and more energetic. This is especially true if your red blood cells have gotten low (anemic). The transfusion  raises the level of the red blood cells which carry oxygen, and this usually causes an energy increase.  The nurse administering the transfusion will monitor you carefully for complications. HOME CARE INSTRUCTIONS  No special instructions are needed after a transfusion. You may find your energy is better. Speak with your caregiver about any limitations on activity for underlying diseases you may have. SEEK MEDICAL CARE IF:   Your condition is not improving after your transfusion.  You develop redness or irritation at the intravenous (IV) site. SEEK IMMEDIATE MEDICAL CARE IF:  Any of the following symptoms occur over the next 12 hours:  Shaking chills.  You have a temperature by mouth above 102 F (38.9 C), not controlled by medicine.  Chest, back, or muscle pain.  People around you feel you are not acting correctly or are confused.  Shortness of breath or difficulty breathing.  Dizziness and fainting.  You get a rash or develop hives.  You have a decrease in urine output.  Your urine turns a dark color or changes to pink, red, or brown. Any of the following symptoms occur over the next 10 days:  You have a temperature by mouth above 102 F (38.9 C), not controlled by medicine.  Shortness of breath.  Weakness after normal activity.  The white part of the eye turns yellow (jaundice).  You have a decrease in the amount of urine or are urinating less often.  Your urine turns a dark color or changes to pink, red, or brown. Document Released: 08/26/2000 Document Revised: 11/21/2011 Document Reviewed: 04/14/2008 ExitCare Patient Information 2014 [place].  _______________________________________________________________________  Incentive Spirometer  An incentive spirometer is a tool that can help keep your lungs clear and active. This tool measures how well you are filling your lungs with each breath. Taking long deep breaths may help reverse or decrease the chance  of developing breathing (pulmonary) problems (especially infection) following:  A long period of time when you are unable to move or be active. BEFORE THE PROCEDURE   If the spirometer includes an indicator to show your best effort, your nurse or respiratory therapist will set it to a desired goal.  If possible, sit up straight or lean slightly forward. Try not to slouch.  Hold the incentive spirometer in an upright position. INSTRUCTIONS FOR USE  3. Sit on the edge of your bed if possible, or sit up as far as you can in bed or on a chair. 4. Hold the incentive spirometer in an upright position. 5. Breathe out normally. 6. Place the mouthpiece in your mouth and seal your lips tightly around it. 7. Breathe in slowly and as deeply as possible, raising the piston or the ball toward the top of the column. 8. Hold your breath for 3-5 seconds or for as long as possible. Allow the piston or ball to fall to the bottom of the column. 9. Remove the mouthpiece from your mouth and breathe out normally. 10. Rest for a few seconds and repeat Steps 1 through 7 at least 10 times every 1-2 hours when you are awake. Take your time and take a few normal breaths between deep breaths. 11. The spirometer may include an indicator to show your best effort. Use the indicator as a goal to work toward  during each repetition. 12. After each set of 10 deep breaths, practice coughing to be sure your lungs are clear. If you have an incision (the cut made at the time of surgery), support your incision when coughing by placing a pillow or rolled up towels firmly against it. Once you are able to get out of bed, walk around indoors and cough well. You may stop using the incentive spirometer when instructed by your caregiver.  RISKS AND COMPLICATIONS  Take your time so you do not get dizzy or light-headed.  If you are in pain, you may need to take or ask for pain medication before doing incentive spirometry. It is harder to  take a deep breath if you are having pain. AFTER USE  Rest and breathe slowly and easily.  It can be helpful to keep track of a log of your progress. Your caregiver can provide you with a simple table to help with this. If you are using the spirometer at home, follow these instructions: [place] IF:   You are having difficultly using the spirometer.  You have trouble using the spirometer as often as instructed.  Your pain medication is not giving enough relief while using the spirometer.  You develop fever of 100.5 F (38.1 C) or higher. SEEK IMMEDIATE MEDICAL CARE IF:   You cough up bloody sputum that had not been present before.  You develop fever of 102 F (38.9 C) or greater.  You develop worsening pain at or near the incision site. MAKE SURE YOU:   Understand these instructions.  Will watch your condition.  Will get help right away if you are not doing well or get worse. Document Released: 01/09/2007 Document Revised: 11/21/2011 Document Reviewed: 03/12/2007 [facility] Patient Information 2014 [place], [place].   ________________________________________________________________________

## 2015-12-08 NOTE — H&P (Signed)
Terri Liu DOB: 16-Dec-1933 Married / Language: English / Race: White Female  H&P Date 12/08/15  Chief Complaint Left knee pain  History of Present Illness  The patient is a 82 year old female who comes in today for a preoperative History and Physical. The patient is scheduled for a left total knee arthroplasty to be performed by Dr. Johnn Hai, MD at [facility] on December 17, 2015. [name] reports chronic pain in the knee progressively worsening refractory to cortisone injections and interfering with ADLs and quality of life. She desires to proceed with surgery. Dr. Tonita Cong and the patient mutually agreed to proceed with a Left total knee replacement. Risks and benefits of the procedure were discussed including stiffness, suboptimal range of motion, persistent pain, infection requiring removal of prosthesis and reinsertion, need for prophylactic antibiotics in the future, for example, dental procedures, possible need for manipulation, revision in the future and also anesthetic complications including DVT, PE, etc. We discussed the perioperative course, time in the hospital, postoperative recovery and the need for elevation to control swelling. We also discussed the predicted range of motion and the probability that squatting and kneeling would be unobtainable in the future. In addition, postoperative anticoagulation was discussed. We have obtained preoperative medical clearance as necessary. Provided her illustrated handout and discussed it in detail. They will enroll in the total joint replacement educational forum at the hospital. Her [facility] pre-op appt is scheduled for tomorrow.  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 Cancer  Father, Brother, Sister.  Social History Tobacco / smoke exposure  09/03/2013: no Children  3 Current work status  retired Furniture conservator/restorer daily Living situation  live with spouse Marital status  Married. Post-Surgical Plans  home with HHPT; one level home, husband as caregiver; 3 steps to enter Advance Directives  none  Medication History  Valsartan (160MG  Tablet, Oral) Active. Hydrochlorothiazide (25MG  Tablet, Oral) Active. OXcarbazepine (150MG  Tablet, Oral) Active. Lovastatin (40MG  Tablet, Oral) Active. PrednisoLONE Acetate (1% Suspension, Ophthalmic) Active. Prenatal Vitamin (Oral) Active. Cherry Concentrate (Oral) Active. Aspirin (81MG  Tablet, Oral) Active. Hair Skin and Nails Formula (Oral) Active. Medications Reconciled  Past Surgical History Total Knee Replacement - Right  Arthroscopic Knee Surgery - Right  Arthroscopic Knee Surgery - Left  Cholecystectomy  Hysterectomy; Total  Corneal Transplant  Detached Retina Repair   Review of Systems  General Not Present- Chills, Fatigue, Fever, Memory Loss, Night Sweats, Weight Gain and Weight Loss. Skin Not Present- Eczema, Hives, Itching, Lesions and Rash. HEENT Not Present- Dentures, Double Vision, Headache, Hearing Loss, Tinnitus and Visual Loss. Respiratory Not Present- Allergies, Chronic Cough, Coughing up blood, Shortness of breath at rest and Shortness of breath with exertion. Cardiovascular Not Present- Chest Pain, Difficulty Breathing Lying Down, Murmur, Palpitations, Racing/skipping heartbeats and Swelling. Gastrointestinal Not Present- Abdominal  Pain, Bloody Stool, Constipation, Diarrhea, Difficulty Swallowing, Heartburn, Jaundice, Loss of appetitie, Nausea and Vomiting. Female Genitourinary Not Present- Blood in Urine, Discharge, Flank Pain, Incontinence, Painful Urination, Urgency, Urinary frequency, Urinary Retention, Urinating at Night and Weak urinary stream. Musculoskeletal Present- Joint  Pain and Joint Swelling. Not Present- Back Pain, Morning Stiffness, Muscle Pain, Muscle Weakness and Spasms. Neurological Not Present- Blackout spells, Difficulty with balance, Dizziness, Paralysis, Tremor and Weakness. Psychiatric Not Present- Insomnia.  Physical Exam General Mental Status -Alert, cooperative and good historian. General Appearance-pleasant, Not in acute distress. Orientation-Oriented X3. Build & Nutrition-Well nourished and Well developed.  Head and Neck Head-normocephalic, atraumatic . Neck Global Assessment - supple, no bruit auscultated on the right, no bruit auscultated on the left.  Eye Pupil - Bilateral-Regular and Round. Motion - Bilateral-EOMI.  Chest and Lung Exam Auscultation Breath sounds - clear at anterior chest wall and clear at posterior chest wall. Adventitious sounds - No Adventitious sounds.  Cardiovascular Auscultation Rhythm - Regular rate and rhythm. Heart Sounds - S1 WNL and S2 WNL. Murmurs & Other Heart Sounds - Auscultation of the heart reveals - No Murmurs.  Abdomen Palpation/Percussion Tenderness - Abdomen is non-tender to palpation. Rigidity (guarding) - Abdomen is soft. Auscultation Auscultation of the abdomen reveals - Bowel sounds normal.  Female Genitourinary Note: Not done, not pertinent to present illness  Musculoskeletal Left knee is exquisitely tender in the medial joint line. Patellofemoral pain on compression. Her range is -5 to 90. Ipsilateral hip and ankle exam is unremarkable. No DVT.  Imaging Left knee AP standing and lateral demonstrates bone on bone  arthrosis, medial compartment, varus deformity, patellofemoral arthrosis.  Assessment & Plan Primary osteoarthritis of left knee (M17.12)  Pt with end-stage Left knee DJD, bone-on-bone, refractory to conservative tx, scheduled for Left total knee replacement by Dr. Tonita Cong on 12/17/15. We again discussed the procedure itself as well as risks, complications and alternatives, including but not limited to DVT, PE, infx, bleeding, failure of procedure, need for secondary procedure including manipulation, nerve injury, ongoing pain/symptoms, anesthesia risk, even stroke or death. Also discussed typical post-op protocols, activity restrictions, need for PT, flexion/extension exercises, time out of work. Discussed need for DVT ppx post-op with Xarelto then ASA per protocol. Discussed dental ppx. Also discussed limitations post-operatively such as kneeling and squatting. All questions were answered. Patient desires to proceed with surgery as scheduled. Will hold supplements, ASA and NSAIDs accordingly. Will remain NPO after MN night before surgery. Will present to [facility] for pre-op testing. Plan Xarelto 2 weeks post-op for DVT ppx then ASA. Plan pain medication, Robaxin, Colace. Plan home with HHPT post-op with family members at home for assistance. Will follow up 10-14 days post-op for suture removal and xrays.  Plan left total knee replacement and removal of loose body  Signed electronically by Lacie Draft, PA-C for Dr. Tonita Cong

## 2015-12-09 ENCOUNTER — Encounter (HOSPITAL_COMMUNITY): Payer: Self-pay

## 2015-12-09 ENCOUNTER — Ambulatory Visit (HOSPITAL_COMMUNITY)
Admission: RE | Admit: 2015-12-09 | Discharge: 2015-12-09 | Disposition: A | Discharge status: Home / Self Care | Payer: PPO | Source: Ambulatory Visit | Attending: Orthopedic Surgery | Admitting: Orthopedic Surgery

## 2015-12-09 ENCOUNTER — Encounter (HOSPITAL_COMMUNITY)
Admission: RE | Admit: 2015-12-09 | Discharge: 2015-12-09 | Disposition: A | Discharge status: Home / Self Care | Payer: PPO | Source: Ambulatory Visit | Attending: Specialist | Admitting: Specialist

## 2015-12-09 ENCOUNTER — Ambulatory Visit: Payer: Self-pay | Admitting: Orthopedic Surgery

## 2015-12-09 DIAGNOSIS — M1712 Unilateral primary osteoarthritis, left knee: Secondary | ICD-10-CM | POA: Diagnosis not present

## 2015-12-09 DIAGNOSIS — Z01818 Encounter for other preprocedural examination: Secondary | ICD-10-CM | POA: Diagnosis not present

## 2015-12-09 LAB — COMPREHENSIVE METABOLIC PANEL
ALT: 21 U/L (ref 14–54)
AST: 29 U/L (ref 15–41)
Albumin: 4.2 g/dL (ref 3.5–5.0)
Alkaline Phosphatase: 55 U/L (ref 38–126)
Anion gap: 9 (ref 5–15)
BILIRUBIN TOTAL: 1.3 mg/dL — AB (ref 0.3–1.2)
BUN: 9 mg/dL (ref 6–20)
CO2: 34 mmol/L — ABNORMAL HIGH (ref 22–32)
Calcium: 9.7 mg/dL (ref 8.9–10.3)
Chloride: 97 mmol/L — ABNORMAL LOW (ref 101–111)
Creatinine, Ser: 0.52 mg/dL (ref 0.44–1.00)
GFR calc Af Amer: >60 mL/min (ref >60)
GLUCOSE: 107 mg/dL — AB (ref 65–99)
Potassium: 4 mmol/L (ref 3.5–5.1)
Sodium: 140 mmol/L (ref 135–145)
TOTAL PROTEIN: 6.8 g/dL (ref 6.5–8.1)

## 2015-12-09 LAB — URINALYSIS, ROUTINE W REFLEX MICROSCOPIC
Bilirubin Urine: NEGATIVE
Glucose, UA: NEGATIVE mg/dL
Hgb urine dipstick: NEGATIVE
Ketones, ur: NEGATIVE mg/dL
Nitrite: NEGATIVE
PH: 7.5 (ref 5.0–8.0)
Protein, ur: NEGATIVE mg/dL
SPECIFIC GRAVITY, URINE: 1.008 (ref 1.005–1.030)

## 2015-12-09 LAB — PROTIME-INR
INR: 1.01 (ref 0.00–1.49)
Prothrombin Time: 13.5 seconds (ref 11.6–15.2)

## 2015-12-09 LAB — CBC
HCT: 41.2 % (ref 36.0–46.0)
Hemoglobin: 14.3 g/dL (ref 12.0–15.0)
MCH: 32.1 pg (ref 26.0–34.0)
MCHC: 34.7 g/dL (ref 30.0–36.0)
MCV: 92.6 fL (ref 78.0–100.0)
Platelets: 257 10*3/uL (ref 150–400)
RBC: 4.45 MIL/uL (ref 3.87–5.11)
RDW: 12.4 % (ref 11.5–15.5)
WBC: 6.2 10*3/uL (ref 4.0–10.5)

## 2015-12-09 LAB — SURGICAL PCR SCREEN
MRSA, PCR: NEGATIVE
Staphylococcus aureus: NEGATIVE

## 2015-12-09 LAB — URINE MICROSCOPIC-ADD ON: RBC / HPF: NONE SEEN RBC/hpf (ref 0–5)

## 2015-12-09 NOTE — Progress Notes (Signed)
ekg 10-15-15 dr brown on chart

## 2015-12-16 MED ORDER — TRANEXAMIC ACID 1000 MG/10ML IV SOLN
1000.0000 mg | INTRAVENOUS | Status: AC
  Administered 2015-12-17: 1000 mg via INTRAVENOUS
  Filled 2015-12-16: qty 10

## 2015-12-17 ENCOUNTER — Inpatient Hospital Stay (HOSPITAL_COMMUNITY)
Admission: RE | Admit: 2015-12-17 | Discharge: 2015-12-20 | DRG: 470 | Disposition: A | Discharge status: Home Health | Payer: PPO | Source: Ambulatory Visit | Attending: Specialist | Admitting: Specialist

## 2015-12-17 ENCOUNTER — Inpatient Hospital Stay (HOSPITAL_COMMUNITY): Payer: PPO

## 2015-12-17 ENCOUNTER — Encounter (HOSPITAL_COMMUNITY)
Admission: RE | Disposition: A | Discharge status: Home Health | Payer: Self-pay | Source: Ambulatory Visit | Attending: Specialist

## 2015-12-17 ENCOUNTER — Inpatient Hospital Stay (HOSPITAL_COMMUNITY): Payer: PPO | Admitting: Registered Nurse

## 2015-12-17 ENCOUNTER — Encounter (HOSPITAL_COMMUNITY): Payer: Self-pay | Admitting: *Deleted

## 2015-12-17 DIAGNOSIS — Z79899 Other long term (current) drug therapy: Secondary | ICD-10-CM | POA: Diagnosis not present

## 2015-12-17 DIAGNOSIS — Z6828 Body mass index (BMI) 28.0-28.9, adult: Secondary | ICD-10-CM | POA: Diagnosis not present

## 2015-12-17 DIAGNOSIS — M1712 Unilateral primary osteoarthritis, left knee: Principal | ICD-10-CM | POA: Diagnosis present

## 2015-12-17 DIAGNOSIS — M25562 Pain in left knee: Secondary | ICD-10-CM | POA: Diagnosis not present

## 2015-12-17 DIAGNOSIS — F329 Major depressive disorder, single episode, unspecified: Secondary | ICD-10-CM | POA: Diagnosis present

## 2015-12-17 DIAGNOSIS — E669 Obesity, unspecified: Secondary | ICD-10-CM | POA: Diagnosis not present

## 2015-12-17 DIAGNOSIS — G8929 Other chronic pain: Secondary | ICD-10-CM | POA: Diagnosis not present

## 2015-12-17 DIAGNOSIS — R112 Nausea with vomiting, unspecified: Secondary | ICD-10-CM | POA: Diagnosis not present

## 2015-12-17 DIAGNOSIS — Z7982 Long term (current) use of aspirin: Secondary | ICD-10-CM

## 2015-12-17 DIAGNOSIS — E7800 Pure hypercholesterolemia, unspecified: Secondary | ICD-10-CM | POA: Diagnosis not present

## 2015-12-17 DIAGNOSIS — M21162 Varus deformity, not elsewhere classified, left knee: Secondary | ICD-10-CM | POA: Diagnosis not present

## 2015-12-17 DIAGNOSIS — Z96652 Presence of left artificial knee joint: Secondary | ICD-10-CM | POA: Diagnosis not present

## 2015-12-17 DIAGNOSIS — M179 Osteoarthritis of knee, unspecified: Secondary | ICD-10-CM | POA: Diagnosis not present

## 2015-12-17 DIAGNOSIS — Z8619 Personal history of other infectious and parasitic diseases: Secondary | ICD-10-CM

## 2015-12-17 DIAGNOSIS — Z96659 Presence of unspecified artificial knee joint: Secondary | ICD-10-CM

## 2015-12-17 DIAGNOSIS — I10 Essential (primary) hypertension: Secondary | ICD-10-CM | POA: Diagnosis not present

## 2015-12-17 DIAGNOSIS — Z471 Aftercare following joint replacement surgery: Secondary | ICD-10-CM | POA: Diagnosis not present

## 2015-12-17 HISTORY — PX: TOTAL KNEE ARTHROPLASTY: SHX125

## 2015-12-17 LAB — TYPE AND SCREEN
ABO/RH(D): A POS
ANTIBODY SCREEN: NEGATIVE

## 2015-12-17 SURGERY — ARTHROPLASTY, KNEE, TOTAL
Anesthesia: General | Site: Knee | Laterality: Left

## 2015-12-17 MED ORDER — MAGNESIUM CITRATE PO SOLN: 1.0000 | Freq: Once | ORAL | Status: DC | PRN

## 2015-12-17 MED ORDER — BISACODYL 5 MG PO TBEC: 5.0000 mg | DELAYED_RELEASE_TABLET | Freq: Every day | ORAL | Status: DC | PRN

## 2015-12-17 MED ORDER — FENTANYL CITRATE (PF) 100 MCG/2ML IJ SOLN
INTRAMUSCULAR | Status: DC | PRN
  Administered 2015-12-17 (×4): 50 ug via INTRAVENOUS

## 2015-12-17 MED ORDER — ONDANSETRON HCL 4 MG PO TABS: 4.0000 mg | ORAL_TABLET | Freq: Four times a day (QID) | ORAL | Status: DC | PRN

## 2015-12-17 MED ORDER — FENTANYL CITRATE (PF) 100 MCG/2ML IJ SOLN
INTRAMUSCULAR | Status: AC
  Filled 2015-12-17: qty 2

## 2015-12-17 MED ORDER — METOCLOPRAMIDE HCL 10 MG PO TABS: 5.0000 mg | ORAL_TABLET | Freq: Three times a day (TID) | ORAL | Status: DC | PRN

## 2015-12-17 MED ORDER — HYDROMORPHONE HCL 2 MG/ML IJ SOLN
INTRAMUSCULAR | Status: AC
  Filled 2015-12-17: qty 1

## 2015-12-17 MED ORDER — DIPHENHYDRAMINE HCL 12.5 MG/5ML PO ELIX: 12.5000 mg | ORAL_SOLUTION | ORAL | Status: DC | PRN

## 2015-12-17 MED ORDER — SODIUM CHLORIDE 0.9 % IR SOLN
Status: DC | PRN
  Administered 2015-12-17: 2000 mL

## 2015-12-17 MED ORDER — DEXTROSE 5 % IV SOLN
500.0000 mg | Freq: Four times a day (QID) | INTRAVENOUS | Status: DC | PRN
  Administered 2015-12-17: 500 mg via INTRAVENOUS
  Filled 2015-12-17 (×2): qty 5

## 2015-12-17 MED ORDER — PHENOL 1.4 % MT LIQD: 1.0000 | OROMUCOSAL | Status: DC | PRN

## 2015-12-17 MED ORDER — METOCLOPRAMIDE HCL 5 MG/ML IJ SOLN
5.0000 mg | Freq: Three times a day (TID) | INTRAMUSCULAR | Status: DC | PRN
  Administered 2015-12-18 (×2): 10 mg via INTRAVENOUS
  Filled 2015-12-17 (×2): qty 2

## 2015-12-17 MED ORDER — DOCUSATE SODIUM 100 MG PO CAPS: 100.0000 mg | ORAL_CAPSULE | Freq: Two times a day (BID) | ORAL | Status: DC | PRN

## 2015-12-17 MED ORDER — BUPIVACAINE-EPINEPHRINE 0.25% -1:200000 IJ SOLN
INTRAMUSCULAR | Status: AC
  Filled 2015-12-17: qty 1

## 2015-12-17 MED ORDER — SODIUM CHLORIDE 0.9 % IR SOLN
Status: AC
  Filled 2015-12-17: qty 1

## 2015-12-17 MED ORDER — MEPERIDINE HCL 50 MG/ML IJ SOLN: 6.2500 mg | INTRAMUSCULAR | Status: DC | PRN

## 2015-12-17 MED ORDER — EPHEDRINE SULFATE 50 MG/ML IJ SOLN
INTRAMUSCULAR | Status: DC | PRN
  Administered 2015-12-17 (×3): 5 mg via INTRAVENOUS

## 2015-12-17 MED ORDER — OXYCODONE-ACETAMINOPHEN 5-325 MG PO TABS: 1.0000 | ORAL_TABLET | ORAL | Status: DC | PRN

## 2015-12-17 MED ORDER — RISAQUAD PO CAPS
1.0000 | ORAL_CAPSULE | Freq: Every day | ORAL | Status: DC
  Administered 2015-12-17 – 2015-12-20 (×3): 1 via ORAL
  Filled 2015-12-17 (×4): qty 1

## 2015-12-17 MED ORDER — KCL IN DEXTROSE-NACL 20-5-0.45 MEQ/L-%-% IV SOLN
INTRAVENOUS | Status: AC
  Administered 2015-12-17 – 2015-12-18 (×2): via INTRAVENOUS
  Filled 2015-12-17 (×3): qty 1000

## 2015-12-17 MED ORDER — LACTATED RINGERS IV SOLN
INTRAVENOUS | Status: DC
  Administered 2015-12-17: 1000 mL via INTRAVENOUS

## 2015-12-17 MED ORDER — ASPIRIN EC 325 MG PO TBEC
325.0000 mg | DELAYED_RELEASE_TABLET | Freq: Every day | ORAL | Status: DC
Start: 2015-12-17 — End: 2015-12-19

## 2015-12-17 MED ORDER — METOCLOPRAMIDE HCL 5 MG/ML IJ SOLN: 10.0000 mg | Freq: Once | INTRAMUSCULAR | Status: DC | PRN

## 2015-12-17 MED ORDER — PROPOFOL 10 MG/ML IV BOLUS
INTRAVENOUS | Status: DC | PRN
  Administered 2015-12-17: 140 mg via INTRAVENOUS

## 2015-12-17 MED ORDER — METHOCARBAMOL 500 MG PO TABS
500.0000 mg | ORAL_TABLET | Freq: Four times a day (QID) | ORAL | Status: DC | PRN
  Administered 2015-12-18 – 2015-12-20 (×5): 500 mg via ORAL
  Filled 2015-12-17 (×5): qty 1

## 2015-12-17 MED ORDER — ONDANSETRON HCL 4 MG/2ML IJ SOLN
INTRAMUSCULAR | Status: AC
  Filled 2015-12-17: qty 2

## 2015-12-17 MED ORDER — ACETAMINOPHEN 325 MG PO TABS
650.0000 mg | ORAL_TABLET | Freq: Four times a day (QID) | ORAL | Status: DC | PRN
  Administered 2015-12-18 (×2): 650 mg via ORAL
  Filled 2015-12-17 (×2): qty 2

## 2015-12-17 MED ORDER — PREDNISOLONE ACETATE 1 % OP SUSP
1.0000 [drp] | Freq: Four times a day (QID) | OPHTHALMIC | Status: DC
  Administered 2015-12-18 – 2015-12-19 (×2): 1 [drp] via OPHTHALMIC
  Filled 2015-12-17: qty 1

## 2015-12-17 MED ORDER — POLYETHYLENE GLYCOL 3350 17 G PO PACK: 17.0000 g | PACK | Freq: Every day | ORAL | Status: DC | PRN

## 2015-12-17 MED ORDER — PHENYLEPHRINE HCL 10 MG/ML IJ SOLN
INTRAMUSCULAR | Status: DC | PRN
  Administered 2015-12-17: 80 ug via INTRAVENOUS

## 2015-12-17 MED ORDER — STERILE WATER FOR IRRIGATION IR SOLN
Status: DC | PRN
  Administered 2015-12-17: 2000 mL

## 2015-12-17 MED ORDER — PROPOFOL 10 MG/ML IV BOLUS
INTRAVENOUS | Status: AC
  Filled 2015-12-17: qty 40

## 2015-12-17 MED ORDER — HYDROMORPHONE HCL 1 MG/ML IJ SOLN
INTRAMUSCULAR | Status: DC | PRN
  Administered 2015-12-17 (×4): .4 mg via INTRAVENOUS

## 2015-12-17 MED ORDER — BUPIVACAINE-EPINEPHRINE 0.25% -1:200000 IJ SOLN
INTRAMUSCULAR | Status: DC | PRN
  Administered 2015-12-17: 40 mL

## 2015-12-17 MED ORDER — ONDANSETRON HCL 4 MG/2ML IJ SOLN
4.0000 mg | Freq: Four times a day (QID) | INTRAMUSCULAR | Status: DC | PRN
  Administered 2015-12-17: 4 mg via INTRAVENOUS
  Filled 2015-12-17: qty 2

## 2015-12-17 MED ORDER — HYDROCHLOROTHIAZIDE 25 MG PO TABS
25.0000 mg | ORAL_TABLET | Freq: Every day | ORAL | Status: DC
  Administered 2015-12-19: 25 mg via ORAL
  Filled 2015-12-17 (×4): qty 1

## 2015-12-17 MED ORDER — DEXTROSE 5 % IV SOLN
2.0000 g | INTRAVENOUS | Status: AC
  Administered 2015-12-17: 2 g via INTRAVENOUS
  Filled 2015-12-17: qty 20

## 2015-12-17 MED ORDER — SUGAMMADEX SODIUM 200 MG/2ML IV SOLN
INTRAVENOUS | Status: AC
  Filled 2015-12-17: qty 2

## 2015-12-17 MED ORDER — MENTHOL 3 MG MT LOZG
1.0000 | LOZENGE | OROMUCOSAL | Status: DC | PRN
  Filled 2015-12-17: qty 9

## 2015-12-17 MED ORDER — ASPIRIN EC 325 MG PO TBEC
325.0000 mg | DELAYED_RELEASE_TABLET | Freq: Two times a day (BID) | ORAL | Status: DC
  Administered 2015-12-17 – 2015-12-20 (×6): 325 mg via ORAL
  Filled 2015-12-17 (×8): qty 1

## 2015-12-17 MED ORDER — DOCUSATE SODIUM 100 MG PO CAPS
100.0000 mg | ORAL_CAPSULE | Freq: Two times a day (BID) | ORAL | Status: DC
  Administered 2015-12-19 – 2015-12-20 (×3): 100 mg via ORAL

## 2015-12-17 MED ORDER — CEFAZOLIN SODIUM-DEXTROSE 2-4 GM/100ML-% IV SOLN
INTRAVENOUS | Status: AC
  Filled 2015-12-17: qty 100

## 2015-12-17 MED ORDER — SUGAMMADEX SODIUM 200 MG/2ML IV SOLN
INTRAVENOUS | Status: DC | PRN
  Administered 2015-12-17: 150 mg via INTRAVENOUS

## 2015-12-17 MED ORDER — HYDROMORPHONE HCL 1 MG/ML IJ SOLN
0.5000 mg | INTRAMUSCULAR | Status: DC | PRN
  Administered 2015-12-17 – 2015-12-18 (×2): 0.5 mg via INTRAVENOUS
  Filled 2015-12-17 (×2): qty 1

## 2015-12-17 MED ORDER — SODIUM CHLORIDE 0.9 % IR SOLN
Status: DC | PRN
  Administered 2015-12-17: 500 mL

## 2015-12-17 MED ORDER — PHENYLEPHRINE 40 MCG/ML (10ML) SYRINGE FOR IV PUSH (FOR BLOOD PRESSURE SUPPORT)
PREFILLED_SYRINGE | INTRAVENOUS | Status: AC
  Filled 2015-12-17: qty 10

## 2015-12-17 MED ORDER — FENTANYL CITRATE (PF) 100 MCG/2ML IJ SOLN
25.0000 ug | INTRAMUSCULAR | Status: DC | PRN
  Administered 2015-12-17 (×3): 50 ug via INTRAVENOUS

## 2015-12-17 MED ORDER — OXYCODONE HCL 5 MG PO TABS
5.0000 mg | ORAL_TABLET | ORAL | Status: DC | PRN
  Administered 2015-12-17: 10 mg via ORAL
  Administered 2015-12-17: 5 mg via ORAL
  Administered 2015-12-18: 10 mg via ORAL
  Administered 2015-12-19 – 2015-12-20 (×7): 5 mg via ORAL
  Filled 2015-12-17 (×4): qty 1
  Filled 2015-12-17: qty 2
  Filled 2015-12-17 (×4): qty 1
  Filled 2015-12-17: qty 2

## 2015-12-17 MED ORDER — ROCURONIUM BROMIDE 100 MG/10ML IV SOLN
INTRAVENOUS | Status: DC | PRN
  Administered 2015-12-17: 30 mg via INTRAVENOUS

## 2015-12-17 MED ORDER — ONDANSETRON HCL 4 MG/2ML IJ SOLN
INTRAMUSCULAR | Status: DC | PRN
  Administered 2015-12-17: 4 mg via INTRAVENOUS

## 2015-12-17 MED ORDER — LIDOCAINE HCL (CARDIAC) 20 MG/ML IV SOLN
INTRAVENOUS | Status: DC | PRN
  Administered 2015-12-17: 80 mg via INTRAVENOUS

## 2015-12-17 MED ORDER — LIDOCAINE HCL (CARDIAC) 20 MG/ML IV SOLN
INTRAVENOUS | Status: AC
  Filled 2015-12-17: qty 5

## 2015-12-17 MED ORDER — ALUM & MAG HYDROXIDE-SIMETH 200-200-20 MG/5ML PO SUSP: 30.0000 mL | ORAL | Status: DC | PRN

## 2015-12-17 MED ORDER — IRBESARTAN 75 MG PO TABS
37.5000 mg | ORAL_TABLET | Freq: Every day | ORAL | Status: DC
  Administered 2015-12-19: 37.5 mg via ORAL
  Filled 2015-12-17 (×4): qty 0.5

## 2015-12-17 MED ORDER — CEFAZOLIN SODIUM-DEXTROSE 2-4 GM/100ML-% IV SOLN
2.0000 g | Freq: Four times a day (QID) | INTRAVENOUS | Status: AC
  Administered 2015-12-17 – 2015-12-18 (×3): 2 g via INTRAVENOUS
  Filled 2015-12-17 (×3): qty 100

## 2015-12-17 MED ORDER — FENTANYL CITRATE (PF) 100 MCG/2ML IJ SOLN
INTRAMUSCULAR | Status: AC
  Administered 2015-12-17: 50 ug via INTRAVENOUS
  Filled 2015-12-17: qty 2

## 2015-12-17 MED ORDER — OXCARBAZEPINE 150 MG PO TABS
150.0000 mg | ORAL_TABLET | Freq: Every day | ORAL | Status: DC
  Administered 2015-12-17 – 2015-12-19 (×3): 150 mg via ORAL
  Filled 2015-12-17 (×4): qty 1

## 2015-12-17 MED ORDER — ACETAMINOPHEN 650 MG RE SUPP: 650.0000 mg | Freq: Four times a day (QID) | RECTAL | Status: DC | PRN

## 2015-12-17 SURGICAL SUPPLY — 56 items
BANDAGE ACE 4X5 VEL STRL LF (GAUZE/BANDAGES/DRESSINGS) ×3 IMPLANT
BANDAGE ACE 6X5 VEL STRL LF (GAUZE/BANDAGES/DRESSINGS) ×3 IMPLANT
BLADE SAG 18X100X1.27 (BLADE) ×3 IMPLANT
BLADE SAW SGTL 13.0X1.19X90.0M (BLADE) ×3 IMPLANT
CAPT KNEE TOTAL 3 ATTUNE ×3 IMPLANT
CEMENT HV SMART SET (Cement) ×6 IMPLANT
CLOSURE WOUND 1/2 X4 (GAUZE/BANDAGES/DRESSINGS) ×1
CLOTH 2% CHLOROHEXIDINE 3PK (PERSONAL CARE ITEMS) ×3 IMPLANT
CUFF TOURN SGL QUICK 34 (TOURNIQUET CUFF) ×3
CUFF TRNQT CYL 34X4X40X1 (TOURNIQUET CUFF) ×1 IMPLANT
DECANTER SPIKE VIAL GLASS SM (MISCELLANEOUS) ×3 IMPLANT
DRAPE LG THREE QUARTER DISP (DRAPES) ×3 IMPLANT
DRAPE ORTHO SPLIT 77X108 STRL (DRAPES) ×6
DRAPE SURG ORHT 6 SPLT 77X108 (DRAPES) ×2 IMPLANT
DRAPE U-SHAPE 47X51 STRL (DRAPES) ×3 IMPLANT
DRSG AQUACEL AG ADV 3.5X10 (GAUZE/BANDAGES/DRESSINGS) ×2 IMPLANT
DRSG TEGADERM 4X4.75 (GAUZE/BANDAGES/DRESSINGS) IMPLANT
DURAPREP 26ML APPLICATOR (WOUND CARE) ×3 IMPLANT
ELECT REM PT RETURN 9FT ADLT (ELECTROSURGICAL) ×3
ELECTRODE REM PT RTRN 9FT ADLT (ELECTROSURGICAL) ×1 IMPLANT
EVACUATOR 1/8 PVC DRAIN (DRAIN) IMPLANT
GAUZE SPONGE 2X2 8PLY STRL LF (GAUZE/BANDAGES/DRESSINGS) IMPLANT
GLOVE BIOGEL PI IND STRL 7.0 (GLOVE) ×1 IMPLANT
GLOVE BIOGEL PI IND STRL 7.5 (GLOVE) IMPLANT
GLOVE BIOGEL PI IND STRL 8 (GLOVE) ×1 IMPLANT
GLOVE BIOGEL PI INDICATOR 7.0 (GLOVE) ×2
GLOVE BIOGEL PI INDICATOR 7.5 (GLOVE) ×10
GLOVE BIOGEL PI INDICATOR 8 (GLOVE) ×2
GLOVE SURG SS PI 7.0 STRL IVOR (GLOVE) ×3 IMPLANT
GLOVE SURG SS PI 7.5 STRL IVOR (GLOVE) ×7 IMPLANT
GLOVE SURG SS PI 8.0 STRL IVOR (GLOVE) ×6 IMPLANT
GOWN STRL REUS W/ TWL LRG LVL3 (GOWN DISPOSABLE) IMPLANT
GOWN STRL REUS W/TWL LRG LVL3 (GOWN DISPOSABLE) ×3
GOWN STRL REUS W/TWL XL LVL3 (GOWN DISPOSABLE) ×8 IMPLANT
HANDPIECE INTERPULSE COAX TIP (DISPOSABLE) ×3
IMMOBILIZER KNEE 20 (SOFTGOODS) ×3
IMMOBILIZER KNEE 20 THIGH 36 (SOFTGOODS) ×1 IMPLANT
MANIFOLD NEPTUNE II (INSTRUMENTS) ×3 IMPLANT
PACK TOTAL KNEE CUSTOM (KITS) ×3 IMPLANT
POSITIONER SURGICAL ARM (MISCELLANEOUS) ×3 IMPLANT
SET HNDPC FAN SPRY TIP SCT (DISPOSABLE) ×1 IMPLANT
SPONGE GAUZE 2X2 STER 10/PKG (GAUZE/BANDAGES/DRESSINGS)
STAPLER VISISTAT (STAPLE) IMPLANT
STRIP CLOSURE SKIN 1/2X4 (GAUZE/BANDAGES/DRESSINGS) ×1 IMPLANT
SUT BONE WAX W31G (SUTURE) IMPLANT
SUT MNCRL AB 4-0 PS2 18 (SUTURE) ×2 IMPLANT
SUT VIC AB 1 CT1 27 (SUTURE) ×6
SUT VIC AB 1 CT1 27XBRD ANTBC (SUTURE) ×2 IMPLANT
SUT VIC AB 2-0 CT1 27 (SUTURE) ×9
SUT VIC AB 2-0 CT1 TAPERPNT 27 (SUTURE) ×3 IMPLANT
SUT VLOC 180 0 24IN GS25 (SUTURE) ×3 IMPLANT
SYR 50ML LL SCALE MARK (SYRINGE) ×3 IMPLANT
TOWER CARTRIDGE SMART MIX (DISPOSABLE) ×3 IMPLANT
TRAY FOLEY W/METER SILVER 14FR (SET/KITS/TRAYS/PACK) ×3 IMPLANT
WRAP KNEE MAXI GEL POST OP (GAUZE/BANDAGES/DRESSINGS) ×3 IMPLANT
YANKAUER SUCT BULB TIP 10FT TU (MISCELLANEOUS) ×3 IMPLANT

## 2015-12-17 NOTE — Anesthesia Postprocedure Evaluation (Signed)
Anesthesia Post Note  Patient: Terri Liu  Procedure(s) Performed: Procedure(s) (LRB): LEFT TOTAL KNEE ARTHROPLASTY (Left)  Patient location during evaluation: PACU Anesthesia Type: General Level of consciousness: awake and alert Pain management: pain level controlled Vital Signs Assessment: post-procedure vital signs reviewed and stable Respiratory status: spontaneous breathing, nonlabored ventilation, respiratory function stable and patient connected to nasal cannula oxygen Cardiovascular status: blood pressure returned to baseline and stable Postop Assessment: no signs of nausea or vomiting Anesthetic complications: no    Last Vitals:  Filed Vitals:   12/17/15 1313 12/17/15 1315  BP:  135/47  Pulse: 96 95  Temp:    Resp: 12 16    Last Pain:  Filed Vitals:   12/17/15 1332  PainSc: 4                  Montez Hageman

## 2015-12-17 NOTE — Transfer of Care (Signed)
Immediate Anesthesia Transfer of Care Note  Patient: Terri Liu  Procedure(s) Performed: Procedure(s): LEFT TOTAL KNEE ARTHROPLASTY (Left)  Patient Location: PACU  Anesthesia Type:General  Level of Consciousness: awake, alert , oriented and patient cooperative  Airway & Oxygen Therapy: Patient Spontanous Breathing and Patient connected to face mask oxygen  Post-op Assessment: Report given to RN, Post -op Vital signs reviewed and stable and Patient moving all extremities  Post vital signs: Reviewed and stable  Last Vitals:  Filed Vitals:   12/17/15 0745  BP: 128/59  Pulse: 84  Temp: 36.5 C  Resp: 16    Complications: No apparent anesthesia complications

## 2015-12-17 NOTE — H&P (View-Only) (Signed)
Terri Liu DOB: 07-Jun-1934 Married / Language: English / Race: White Female  H&P Date 12/08/15  Chief Complaint Left knee pain  History of Present Illness  The patient is a 82 year old female who comes in today for a preoperative History and Physical. The patient is scheduled for a left total knee arthroplasty to be performed by Dr. Johnn Hai, MD at [facility] on December 17, 2015. Terri Liu reports chronic pain in the knee progressively worsening refractory to cortisone injections and interfering with ADLs and quality of life. She desires to proceed with surgery. Dr. Tonita Cong and the patient mutually agreed to proceed with a Left total knee replacement. Risks and benefits of the procedure were discussed including stiffness, suboptimal range of motion, persistent pain, infection requiring removal of prosthesis and reinsertion, need for prophylactic antibiotics in the future, for example, dental procedures, possible need for manipulation, revision in the future and also anesthetic complications including DVT, PE, etc. We discussed the perioperative course, time in the hospital, postoperative recovery and the need for elevation to control swelling. We also discussed the predicted range of motion and the probability that squatting and kneeling would be unobtainable in the future. In addition, postoperative anticoagulation was discussed. We have obtained preoperative medical clearance as necessary. Provided her illustrated handout and discussed it in detail. They will enroll in the total joint replacement educational forum at the hospital. Her [facility] pre-op appt is scheduled for tomorrow.  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 Cancer  Father, Brother, Sister.  Social History Tobacco / smoke exposure  09/03/2013: no Children  3 Current work status  retired Furniture conservator/restorer daily Living situation  live with spouse Marital status  Married. Post-Surgical Plans  home with HHPT; one level home, husband as caregiver; 3 steps to enter Advance Directives  none  Medication History  Valsartan (160MG  Tablet, Oral) Active. Hydrochlorothiazide (25MG  Tablet, Oral) Active. OXcarbazepine (150MG  Tablet, Oral) Active. Lovastatin (40MG  Tablet, Oral) Active. PrednisoLONE Acetate (1% Suspension, Ophthalmic) Active. Prenatal Vitamin (Oral) Active. Cherry Concentrate (Oral) Active. Aspirin (81MG  Tablet, Oral) Active. Hair Skin and Nails Formula (Oral) Active. Medications Reconciled  Past Surgical History Total Knee Replacement - Right  Arthroscopic Knee Surgery - Right  Arthroscopic Knee Surgery - Left  Cholecystectomy  Hysterectomy; Total  Corneal Transplant  Detached Retina Repair   Review of Systems  General Not Present- Chills, Fatigue, Fever, Memory Loss, Night Sweats, Weight Gain and Weight Loss. Skin Not Present- Eczema, Hives, Itching, Lesions and Rash. HEENT Not Present- Dentures, Double Vision, Headache, Hearing Loss, Tinnitus and Visual Loss. Respiratory Not Present- Allergies, Chronic Cough, Coughing up blood, Shortness of breath at rest and Shortness of breath with exertion. Cardiovascular Not Present- Chest Pain, Difficulty Breathing Lying Down, Murmur, Palpitations, Racing/skipping heartbeats and Swelling. Gastrointestinal Not Present- Abdominal  Pain, Bloody Stool, Constipation, Diarrhea, Difficulty Swallowing, Heartburn, Jaundice, Loss of appetitie, Nausea and Vomiting. Female Genitourinary Not Present- Blood in Urine, Discharge, Flank Pain, Incontinence, Painful Urination, Urgency, Urinary frequency, Urinary Retention, Urinating at Night and Weak urinary stream. Musculoskeletal Present- Joint  Pain and Joint Swelling. Not Present- Back Pain, Morning Stiffness, Muscle Pain, Muscle Weakness and Spasms. Neurological Not Present- Blackout spells, Difficulty with balance, Dizziness, Paralysis, Tremor and Weakness. Psychiatric Not Present- Insomnia.  Physical Exam General Mental Status -Alert, cooperative and good historian. General Appearance-pleasant, Not in acute distress. Orientation-Oriented X3. Build & Nutrition-Well nourished and Well developed.  Head and Neck Head-normocephalic, atraumatic . Neck Global Assessment - supple, no bruit auscultated on the right, no bruit auscultated on the left.  Eye Pupil - Bilateral-Regular and Round. Motion - Bilateral-EOMI.  Chest and Lung Exam Auscultation Breath sounds - clear at anterior chest wall and clear at posterior chest wall. Adventitious sounds - No Adventitious sounds.  Cardiovascular Auscultation Rhythm - Regular rate and rhythm. Heart Sounds - S1 WNL and S2 WNL. Murmurs & Other Heart Sounds - Auscultation of the heart reveals - No Murmurs.  Abdomen Palpation/Percussion Tenderness - Abdomen is non-tender to palpation. Rigidity (guarding) - Abdomen is soft. Auscultation Auscultation of the abdomen reveals - Bowel sounds normal.  Female Genitourinary Note: Not done, not pertinent to present illness  Musculoskeletal Left knee is exquisitely tender in the medial joint line. Patellofemoral pain on compression. Her range is -5 to 90. Ipsilateral hip and ankle exam is unremarkable. No DVT.  Imaging Left knee AP standing and lateral demonstrates bone on bone  arthrosis, medial compartment, varus deformity, patellofemoral arthrosis.  Assessment & Plan Primary osteoarthritis of left knee (M17.12)  Pt with end-stage Left knee DJD, bone-on-bone, refractory to conservative tx, scheduled for Left total knee replacement by Dr. Tonita Cong on 12/17/15. We again discussed the procedure itself as well as risks, complications and alternatives, including but not limited to DVT, PE, infx, bleeding, failure of procedure, need for secondary procedure including manipulation, nerve injury, ongoing pain/symptoms, anesthesia risk, even stroke or death. Also discussed typical post-op protocols, activity restrictions, need for PT, flexion/extension exercises, time out of work. Discussed need for DVT ppx post-op with Xarelto then ASA per protocol. Discussed dental ppx. Also discussed limitations post-operatively such as kneeling and squatting. All questions were answered. Patient desires to proceed with surgery as scheduled. Will hold supplements, ASA and NSAIDs accordingly. Will remain NPO after MN night before surgery. [facility] present to [facility] for pre-op testing. Plan Xarelto 2 weeks post-op for DVT ppx then ASA. Plan pain medication, Robaxin, Colace. Plan home with HHPT post-op with family members at home for assistance. Will follow up 10-14 days post-op for suture removal and xrays.  Plan left total knee replacement and removal of loose body  Signed electronically by Lacie Draft, PA-C for Dr. Tonita Cong

## 2015-12-17 NOTE — Brief Op Note (Signed)
12/17/2015  12:03 PM  PATIENT:  Nils Flack Nuss  82 y.o. female  PRE-OPERATIVE DIAGNOSIS:  DJD LEFT KNEE  POST-OPERATIVE DIAGNOSIS:  DJD LEFT KNEE  PROCEDURE:  Procedure(s): LEFT TOTAL KNEE ARTHROPLASTY, REMOVAL OF LOOSE BODIES (Left)  SURGEON:  Surgeon(s) and Role:    * Susa Day, MD - Primary  PHYSICIAN ASSISTANT:   ASSISTANTS: Bissell   ANESTHESIA:   general  EBL:  Total I/O In: -  Out: 125 [Urine:100; Blood:25]  BLOOD ADMINISTERED:none  DRAINS: none   LOCAL MEDICATIONS USED:  MARCAINE     SPECIMEN:  No Specimen  DISPOSITION OF SPECIMEN:  N/A  COUNTS:  YES  TOURNIQUET:   Total Tourniquet Time Documented: Thigh (Left) - 69 minutes Total: Thigh (Left) - 69 minutes   DICTATION: .Other Dictation: Dictation Number   [id number]  PLAN OF CARE: Admit for overnight observation  PATIENT DISPOSITION:  PACU - hemodynamically stable.   Delay start of Pharmacological VTE agent (>24hrs) due to surgical blood loss or risk of bleeding: no

## 2015-12-17 NOTE — Anesthesia Preprocedure Evaluation (Addendum)
Anesthesia Evaluation  Patient identified by MRN, date of birth, ID band Patient awake    Reviewed: Allergy & Precautions, NPO status , Patient's Chart, lab work & pertinent test results  Airway Mallampati: II  TM Distance: <3 FB Neck ROM: full    Dental  (+) Edentulous Upper, Edentulous Lower, Dental Advisory Given   Pulmonary  breath sounds clear to auscultation        Cardiovascular hypertension, Pt. on medications and Pt. on home beta blockers Rhythm:regular Rate:Normal     Neuro/Psych Depression  Neuromuscular disease    GI/Hepatic   Endo/Other  obese  Renal/GU      Musculoskeletal  (+) Arthritis -,   Abdominal   Peds  Hematology   Anesthesia Other Findings Dentures out to labelled cup to husband  Reproductive/Obstetrics                          Anesthesia Physical Anesthesia Plan  ASA: II  Anesthesia Plan: General   Post-op Pain Management:    Induction: Intravenous  Airway Management Planned: Oral ETT  Additional Equipment:   Intra-op Plan:   Post-operative Plan: Extubation in OR  Informed Consent: I have reviewed the patients History and Physical, chart, labs and discussed the procedure including the risks, benefits and alternatives for the proposed anesthesia with the patient or authorized representative who has indicated his/her understanding and acceptance.     Plan Discussed with: CRNA, Anesthesiologist and Surgeon  Anesthesia Plan Comments:         Anesthesia Quick Evaluation  

## 2015-12-17 NOTE — Interval H&P Note (Signed)
History and Physical Interval Note:  12/17/2015 10:08 AM  Terri Liu  has presented today for surgery, with the diagnosis of DJD LEFT KNEE  The various methods of treatment have been discussed with the patient and family. After consideration of risks, benefits and other options for treatment, the patient has consented to  Procedure(s): LEFT TOTAL KNEE ARTHROPLASTY, REMOVAL OF LOOSE BODIES (Left) as a surgical intervention .  The patient's history has been reviewed, patient examined, no change in status, stable for surgery.  I have reviewed the patient's chart and labs.  Questions were answered to the patient's satisfaction.     Stace Peace C

## 2015-12-17 NOTE — Anesthesia Procedure Notes (Signed)
Procedure Name: Intubation Date/Time: 12/17/2015 10:21 AM Performed by: Carleene Cooper A Pre-anesthesia Checklist: Patient identified, Timeout performed, Emergency Drugs available, Suction available and Patient being monitored Patient Re-evaluated:Patient Re-evaluated prior to inductionOxygen Delivery Method: Circle system utilized Preoxygenation: Pre-oxygenation with 100% oxygen Intubation Type: IV induction Ventilation: Mask ventilation without difficulty Laryngoscope Size: Miller and 2 Grade View: Grade I Tube type: Oral Tube size: 7.5 mm Number of attempts: 1 Airway Equipment and Method: Stylet Placement Confirmation: breath sounds checked- equal and bilateral,  ETT inserted through vocal cords under direct vision and positive ETCO2 Secured at: 21 cm Tube secured with: Tape Dental Injury: Teeth and Oropharynx as per pre-operative assessment

## 2015-12-18 LAB — BASIC METABOLIC PANEL
ANION GAP: 7 (ref 5–15)
BUN: 6 mg/dL (ref 6–20)
CALCIUM: 8.5 mg/dL — AB (ref 8.9–10.3)
CHLORIDE: 98 mmol/L — AB (ref 101–111)
CO2: 31 mmol/L (ref 22–32)
Creatinine, Ser: 0.53 mg/dL (ref 0.44–1.00)
GFR calc non Af Amer: >60 mL/min (ref >60)
Glucose, Bld: 131 mg/dL — ABNORMAL HIGH (ref 65–99)
Potassium: 3.4 mmol/L — ABNORMAL LOW (ref 3.5–5.1)
Sodium: 136 mmol/L (ref 135–145)

## 2015-12-18 LAB — CBC
HEMATOCRIT: 37.5 % (ref 36.0–46.0)
HEMOGLOBIN: 12.9 g/dL (ref 12.0–15.0)
MCH: 32.7 pg (ref 26.0–34.0)
MCHC: 34.4 g/dL (ref 30.0–36.0)
MCV: 95.2 fL (ref 78.0–100.0)
Platelets: 213 10*3/uL (ref 150–400)
RBC: 3.94 MIL/uL (ref 3.87–5.11)
RDW: 12.7 % (ref 11.5–15.5)
WBC: 10.2 10*3/uL (ref 4.0–10.5)

## 2015-12-18 MED ORDER — DIAZEPAM 5 MG PO TABS: 5.0000 mg | ORAL_TABLET | Freq: Four times a day (QID) | ORAL | Status: DC | PRN

## 2015-12-18 MED ORDER — DIAZEPAM 5 MG/ML IJ SOLN: 5.0000 mg | Freq: Four times a day (QID) | INTRAMUSCULAR | Status: DC | PRN

## 2015-12-18 MED ORDER — SODIUM CHLORIDE 0.9 % IV SOLN
8.0000 mg | Freq: Four times a day (QID) | INTRAVENOUS | Status: DC | PRN
  Administered 2015-12-18: 8 mg via INTRAVENOUS
  Filled 2015-12-18 (×2): qty 4

## 2015-12-18 MED ORDER — KETOROLAC TROMETHAMINE 15 MG/ML IJ SOLN
7.5000 mg | Freq: Four times a day (QID) | INTRAMUSCULAR | Status: DC
  Administered 2015-12-18 – 2015-12-20 (×8): 7.5 mg via INTRAVENOUS
  Filled 2015-12-18 (×12): qty 1

## 2015-12-18 MED ORDER — ONDANSETRON HCL 4 MG PO TABS
8.0000 mg | ORAL_TABLET | Freq: Four times a day (QID) | ORAL | Status: DC | PRN
  Administered 2015-12-19: 8 mg via ORAL
  Filled 2015-12-18 (×3): qty 2

## 2015-12-18 MED ORDER — HYDROMORPHONE HCL 2 MG PO TABS
2.0000 mg | ORAL_TABLET | ORAL | Status: DC | PRN
  Administered 2015-12-18: 4 mg via ORAL
  Filled 2015-12-18: qty 2

## 2015-12-18 MED ORDER — HYDROMORPHONE HCL 2 MG PO TABS
2.0000 mg | ORAL_TABLET | ORAL | Status: DC | PRN
  Administered 2015-12-19: 2 mg via ORAL
  Filled 2015-12-18: qty 1

## 2015-12-18 NOTE — Op Note (Signed)
NAMESCHELLY, CALLERY                 ACCOUNT NO.:  [id number]  MEDICAL RECORD NO.:  [id number]  LOCATION:  [id number]                         FACILITY:  [facility]  PHYSICIAN:  Susa Day, M.D.    DATE OF BIRTH:  01-09-1934  DATE OF PROCEDURE:  12/17/2015 DATE OF DISCHARGE:                              OPERATIVE REPORT   PREOPERATIVE DIAGNOSIS:  End-stage osteoarthrosis, varus deformity, left knee.  POSTOPERATIVE DIAGNOSIS:  End-stage osteoarthrosis, varus deformity, left knee.  PROCEDURE PERFORMED:  Left total knee arthroplasty utilizing Attune DePuy rotating platform, 5 femur, 5 tibia, 35 patella, 7-mm insert.  ANESTHESIA:  General.  ASSISTANT:  Cleophas Dunker, PA.  HISTORY:  A 82 year old, bone-on-bone arthrosis, medial compartment prior to conservative treatment, indicated for placement of degenerative joint.  Risks and benefits were discussed including bleeding, infection, damage to neurovascular structures, DVT, PE, anesthetic complications, suboptimal range of motion.  TECHNIQUE:  With the patient in supine position, after induction of adequate general anesthesia, 2 g of Kefzol, left lower extremity was prepped and draped, and exsanguinated in usual sterile fashion.  Thigh tourniquet inflated to 275 mmHg.  Midline incision was centered over the left knee, full-thickness flaps developed, median parapatellar arthrotomy performed.  Patella everted and knee flexed. Tricompartmental osteoarthrosis was noted particularly medial with bone- on-bone.  Remnants of medial and lateral menisci and ACL were removed. Osteophytes were removed with a rongeur.  Step drill was then utilized and we elevated the soft tissues medially preserving the MCL.  Step drill was utilized to enter the femoral canal, and was irrigated, 5- degree left was utilized.  It was pinned, performed a distal femoral cut.  Due to the fairly tight joint, unable to sublux the tibia to prepare the tibia, we completed  the femur.  We used the distal femoral guide off the anterior cortex.  The 3 degrees of external rotation, pinned and a 5.  We then placed our box cut, performed an anterior, posterior and chamfer cuts with the soft tissue protected posteriorly with a curved Crego.  We then performed our box cut bisecting the canal after placing the external alignment, I then performed the neck cut with a saw.  Attention turned towards the tibia, it was subluxed with retractors.  External alignment guide, bisecting tibiotalar joint, 2 off the defect, which was medial, 3-degree slope parallel with the shaft, it was pinned.  We then performed our cut.  Protected the soft tissues posteriorly.  We checked our flexion and extension gaps, they were equivalent at an 8.  I then completed the tibia, we used a 5 baseplate just to the medial aspect of the tibial tubercle, full coverage.  We centrally drilled that and performed our punch guide.  We placed a trial femur and tibia 5, with a 7 and then an 8-mm insert with full extension, full flexion, good stability, varus and valgus stressing 0-30 degrees. Negative anterior drawer and excellent patellofemoral tracking.  We then prepared the patella, measured to a 22.  We planed 7.5 from that, had a residual 15.  Medialized our patellar component, 35 patellar dome, drilled our Peg holes, placed a trial patella, reduced it, and had excellent patellofemoral tracking.  We removed all instruments, checked posteriorly, there was a loose body.  There was a calcified body noted on her preoperative x-ray posteriorly and medially, and was not accessible through the joint and felt like it was more subcutaneous posteriorly.  We copiously irrigated the joint with antibiotic irrigation, cauterized in the geniculates.  Flexed the knee, all surfaces thoroughly dried.  Mixed the cement on back table in usual fashion.  Injected in the tibial canal under pressure.  We impacted the 5  tibial tray, redundant cement removed.  We cemented the femur and we drilled our lug holes prior to that, redundant cement removed.  We placed a 7 insert and reduced it, held axial load throughout the curing of the cement, redundant cement removed and we cemented the patellar button.  Next, we allowed curing of the cement and during that period, we used Marcaine with epinephrine in periarticular tissues, injected. We then allowed full curing.  We then flexed the knee and removed all redundant cement.  We selected a 7-mm insert permanent, checked posteriorly and meticulously removed all redundant cement and copiously irrigated the wound with antibiotic irrigation, placed a 7-mm permanent insert, reduced it, and again had full extension, full flexion, and good stability, varus and valgus stressing at 0-30 degrees and negative anterior drawer, excellent patellofemoral tracking.  We then released the tourniquet and areas of slight bleeding was noted, these were cauterized.  We therefore selected the nut to use a drain.  A 0.25% Marcaine was placed in the back of the knee, of a 10 mL in the posterior capsule.  Next, we repaired the patellar arthrotomy with #1 Vicryl and then oversewed it with a running V-Loc.  We then had excellent patellofemoral tracking and flexion to gravity at 90 degrees, subcu with 2-0 and skin with Prolene, subcuticular Monocryl.  Sterile dressing applied.  Placed an immobilizer, extubated without difficulty, and transported to the recovery room in satisfactory condition.  The patient tolerated the procedure well.  No complications.  Assistant, Cleophas Dunker, [place].  Tourniquet time was 69 minutes.     Susa Day, M.D.     Geralynn Rile  D:  12/17/2015  T:  12/18/2015  Job:  [id number]

## 2015-12-18 NOTE — Evaluation (Signed)
Physical Therapy Evaluation Patient Details Name: Terri Liu MRN: [id number] DOB: Jun 12, 1934 Today's Date: 12/18/2015   History of Present Illness  s/p L TKA, h/o R TKA  Clinical Impression  Pt s/p L TKR presents with decreased L LE strength/ROM and post op pain and nausea limiting functional mobility.  Pt should progress to dc home with family assist and HHPT follow up.    Follow Up Recommendations Home health PT    Equipment Recommendations  None recommended by PT    Recommendations for Other Services OT consult     Precautions / Restrictions Precautions Precautions: Knee;Fall Required Braces or Orthoses: Knee Immobilizer - Left Knee Immobilizer - Left: Discontinue once straight leg raise with < 10 degree lag Restrictions Weight Bearing Restrictions: No Other Position/Activity Restrictions: WBAT      Mobility  Bed Mobility Overal bed mobility: Needs Assistance Bed Mobility: Supine to Sit;Sit to Supine     Supine to sit: Min assist;Mod assist Sit to supine: Min assist;Mod assist   General bed mobility comments: cues for sequence and use of R LE to self assist  Transfers                 General transfer comment: Unable to attempt 2* increasing nausea and onset of dry heaves  Ambulation/Gait                Stairs            Wheelchair Mobility    Modified Rankin (Stroke Patients Only)       Balance                                             Pertinent Vitals/Pain Pain Assessment: Faces Faces Pain Scale: Hurts little more Pain Location: L knee Pain Descriptors / Indicators: Aching;Sore Pain Intervention(s): Limited activity within patient's tolerance;Monitored during session;Ice applied (No pain meds 2* ongoing nausea)    Home Living Family/patient expects to be discharged to:: Private residence Living Arrangements: Spouse/significant other Available Help at Discharge: Family Type of Home: House Home Access:  Stairs to enter Entrance Stairs-Rails: Right Entrance Stairs-Number of Steps: 4 Home Layout: One level Home Equipment: Bedside commode;Shower seat;Walker - 2 wheels      Prior Function Level of Independence: Independent               Hand Dominance   Dominant Hand: Right    Extremity/Trunk Assessment   Upper Extremity Assessment: Overall WFL for tasks assessed           Lower Extremity Assessment: LLE deficits/detail         Communication   Communication: No difficulties  Cognition Arousal/Alertness: Awake/alert Behavior During Therapy: WFL for tasks assessed/performed Overall Cognitive Status: Within Functional Limits for tasks assessed                      General Comments      Exercises Total Joint Exercises Ankle Circles/Pumps: AROM;Both;Supine;10 reps      Assessment/Plan    PT Assessment Patient needs continued PT services  PT Diagnosis Difficulty walking   PT Problem List Decreased strength;Decreased range of motion;Decreased activity tolerance;Decreased mobility;Decreased knowledge of use of DME;Pain  PT Treatment Interventions DME instruction;Gait training;Stair training;Functional mobility training;Therapeutic activities;Therapeutic exercise;Patient/family education   PT Goals (Current goals can be found in the Care Plan section) Acute Rehab PT  Goals Patient Stated Goal: Walk with less pain PT Goal Formulation: With patient Time For Goal Achievement: 12/25/15 Potential to Achieve Goals: Good    Frequency 7X/week   Barriers to discharge        Co-evaluation PT/OT/SLP Co-Evaluation/Treatment: Yes Reason for Co-Treatment: For patient/therapist safety PT goals addressed during session: Mobility/safety with mobility OT goals addressed during session: ADL's and self-care       End of Session Equipment Utilized During Treatment: Left knee immobilizer Activity Tolerance: Other (comment) (N&V) Patient left: in bed;with call  bell/phone within reach;with bed alarm set;with family/visitor present Nurse Communication: Mobility status;Other (comment) (nausea)         Time: [id number] PT Time Calculation (min) (ACUTE ONLY): 25 min   Charges:   PT Evaluation $PT Eval Low Complexity: 1 Procedure     PT G Codes:        Asaad Gulley 2015/12/20, 12:45 PM

## 2015-12-18 NOTE — Care Management Note (Signed)
Case Management Note  Patient Details  Name: Terri Liu MRN: [id number] Date of Birth: July 11, 1934  Subjective/Objective:     S/p Left total knee arthroplasty                Action/Plan: Discharge planning, spoke with patient and spouse at beside. Chose [facility] for [facility] services, contacted [facility] for referral. Has RW and 3-n-1 from previous surgery.   Expected Discharge Date:                  Expected Discharge Plan:  [place]  In-House Referral:  NA  Discharge planning Services  CM Consult  Post Acute Care Choice:  Home Health Choice offered to:  Patient  DME Arranged:  N/A DME Agency:  NA  HH Arranged:  PT [place] Agency:  [place]  Status of Service:  Completed, signed off  Medicare Important Message Given:    Date Medicare IM Given:    Medicare IM give by:    Date Additional Medicare IM Given:    Additional Medicare Important Message give by:     If discussed at [place] of Stay Meetings, dates discussed:    Additional Comments:  Guadalupe Maple, RN 12/18/2015, 11:02 AM

## 2015-12-18 NOTE — Progress Notes (Signed)
12/18/15 0315 Nursing Brad Dixon PA called reg patient's c/o of high pain despite prescribed pain meds. Ace wrap removed from operative leg. Order received for valium po /iv and dilaudid po  Will continue to monitor patient

## 2015-12-18 NOTE — Progress Notes (Signed)
CSW received consult for SNF placement - CSW reviewed PT evaluation recommending HH.   No further CSW needs identified - CSW signing off.   Raynaldo Opitz, [facility] Clinical Social Worker cell #: [phone]

## 2015-12-18 NOTE — Evaluation (Signed)
Physical Therapy Evaluation Patient Details Name: Terri Liu MRN: [id number] DOB: [birth year] Today's Date: 12/18/2015   History of Present Illness  s/p L TKA, h/o R TKA  Clinical Impression  Pt with decreased nausea but ltd this pm by fatigue and pain level.  Pt taking ltd pain MEDs 2* nausea.    Follow Up Recommendations Home health PT    Equipment Recommendations  None recommended by PT    Recommendations for Other Services OT consult     Precautions / Restrictions Precautions Precautions: Knee;Fall Required Braces or Orthoses: Knee Immobilizer - Left Knee Immobilizer - Left: Discontinue once straight leg raise with < 10 degree lag Restrictions Weight Bearing Restrictions: No Other Position/Activity Restrictions: WBAT      Mobility  Bed Mobility Overal bed mobility: Needs Assistance Bed Mobility: Supine to Sit     Supine to sit: Min assist;Mod assist     General bed mobility comments: cues for sequence and use of R LE to self assist  Transfers Overall transfer level: Needs assistance Equipment used: Rolling walker (2 wheeled) Transfers: Sit to/from Stand Sit to Stand: Min assist;Mod assist;From elevated surface         General transfer comment: cues for LE management and use of UEs to self assist  Ambulation/Gait Ambulation/Gait assistance: Min assist;Mod assist Ambulation Distance (Feet): 35 Feet Assistive device: Rolling walker (2 wheeled) Gait Pattern/deviations: Step-to pattern;Decreased step length - right;Decreased step length - left;Shuffle;Trunk flexed;Antalgic Gait velocity: decr Gait velocity interpretation: Below normal speed for age/gender General Gait Details: cues for sequence, posture, stride length and position from RW  Stairs            Wheelchair Mobility    Modified Rankin (Stroke Patients Only)       Balance                                             Pertinent Vitals/Pain Pain Assessment:  0-10 Pain Score: 6  Pain Location: L knee Pain Descriptors / Indicators: Aching;Sore Pain Intervention(s): Limited activity within patient's tolerance;Monitored during session;Ice applied;Patient requesting pain meds-RN notified (Pain meds ltd 2* nausea.  Tyelenol requested)    Home Living                        Prior Function                 Hand Dominance        Extremity/Trunk Assessment                         Communication      Cognition Arousal/Alertness: Awake/alert Behavior During Therapy: WFL for tasks assessed/performed Overall Cognitive Status: Within Functional Limits for tasks assessed                      General Comments      Exercises        Assessment/Plan    PT Assessment    PT Diagnosis     PT Problem List    PT Treatment Interventions     PT Goals (Current goals can be found in the Care Plan section) Acute Rehab PT Goals Patient Stated Goal: Walk with less pain PT Goal Formulation: With patient Time For Goal Achievement: 12/25/15 Potential to Achieve Goals: Good    Frequency  7X/week   Barriers to discharge        Co-evaluation               End of Session Equipment Utilized During Treatment: Left knee immobilizer Activity Tolerance: Patient limited by fatigue;Patient limited by pain Patient left: in chair;with call bell/phone within reach;with chair alarm set Nurse Communication: Mobility status         Time: [id number] PT Time Calculation (min) (ACUTE ONLY): 21 min   Charges:     PT Treatments $Gait Training: 8-22 mins   PT G Codes:        Terri Liu 2016-01-04, 6:16 PM

## 2015-12-18 NOTE — Progress Notes (Signed)
Occupational Therapy Evaluation Patient Details Name: Terri Liu MRN: [id number] DOB: 1934-07-04 Today's Date: 12/18/2015    History of Present Illness s/p L TKA, h/o R TKA   Clinical Impression   Patient presents to OT with decreased ADL independence and safety s/p L TKA. Session limited by persistent nausea/dry heaving with minimal mobility. OT will follow.    Follow Up Recommendations  No OT follow up;Supervision/Assistance - 24 hour    Equipment Recommendations  None recommended by OT    Recommendations for Other Services       Precautions / Restrictions Precautions Precautions: Knee;Fall Required Braces or Orthoses: Knee Immobilizer - Left Restrictions Weight Bearing Restrictions: No Other Position/Activity Restrictions: WBAT      Mobility Bed Mobility Overal bed mobility: Needs Assistance Bed Mobility: Supine to Sit;Sit to Supine     Supine to sit: Min assist;Mod assist Sit to supine: Min assist;Mod assist      Transfers                      Balance                                            ADL Overall ADL's : Needs assistance/impaired Eating/Feeding: Independent;Bed level   Grooming: Set up;Bed level                               Functional mobility during ADLs: Minimal assistance;Moderate assistance (bed mobility only) General ADL Comments: Session limited by extreme nausea/dry heaving by patient throughout. Patient transitioned from supine to sitting EOB, sat EOB for several minutes, nausea/dry heaving persisted and patient requested to get back to bed. RN made aware.     Vision     Perception     Praxis      Pertinent Vitals/Pain Pain Assessment: Faces Faces Pain Scale: Hurts little more Pain Descriptors / Indicators: Sore Pain Intervention(s): Limited activity within patient's tolerance;Monitored during session;Repositioned;Ice applied     Hand Dominance Right   Extremity/Trunk  Assessment Upper Extremity Assessment Upper Extremity Assessment: Overall WFL for tasks assessed   Lower Extremity Assessment Lower Extremity Assessment: Defer to PT evaluation       Communication Communication Communication: No difficulties   Cognition Arousal/Alertness: Awake/alert Behavior During Therapy: WFL for tasks assessed/performed Overall Cognitive Status: Within Functional Limits for tasks assessed                     General Comments       Exercises       Shoulder Instructions      Home Living Family/patient expects to be discharged to:: Private residence Living Arrangements: Spouse/significant other Available Help at Discharge: Family Type of Home: House             Bathroom Shower/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 Toilet: Handicapped height Bathroom Accessibility: Yes How Accessible: Accessible via walker Home Equipment: Bedside commode;Shower seat;Walker - 2 wheels          Prior Functioning/Environment Level of Independence: Independent             OT Diagnosis: Acute pain;Generalized weakness   OT Problem List: Decreased strength;Decreased range of motion;Decreased activity tolerance;Decreased knowledge of use of DME or AE;Pain   OT Treatment/Interventions: Self-care/ADL training;DME and/or AE instruction;Therapeutic activities;Patient/family education  OT Goals(Current goals can be found in the care plan section) Acute Rehab OT Goals Patient Stated Goal: none stated OT Goal Formulation: With patient Time For Goal Achievement: 01/01/16 Potential to Achieve Goals: Good  OT Frequency: Min 2X/week   Barriers to D/C:            Co-evaluation PT/OT/SLP Co-Evaluation/Treatment: Yes Reason for Co-Treatment: For patient/therapist safety PT goals addressed during session: Mobility/safety with mobility OT goals addressed during session: ADL's and self-care      End of Session Equipment Utilized During Treatment: Left knee  immobilizer Nurse Communication: Other (comment) (nausea/dry heaving throughout session)  Activity Tolerance: Other (comment) (pt with nausea/dry heaving throughout session) Patient left: in bed;with call bell/phone within reach;with bed alarm set;with nursing/sitter in room;with family/visitor present   Time: [id number] OT Time Calculation (min): 23 min Charges:  OT General Charges $OT Visit: 1 Procedure OT Evaluation $OT Eval Low Complexity: 1 Procedure G-Codes:    Malek Skog A 01-13-16, 12:31 PM

## 2015-12-18 NOTE — Progress Notes (Signed)
Patient ID: Terri Liu, female   DOB: 09-19-1933, 82 y.o.   MRN: [id number] Subjective: 1 Day Post-Op Procedure(s) (LRB): LEFT TOTAL KNEE ARTHROPLASTY (Left) Patient reports pain as severe.    Patient has complaints of Severe L knee pain, nausea, vomiting.  We will start therapy today. Plan is to go home with HHPT after hospital stay.  Patient reports pain as moderately severe. Reports significant pain overnight uncontrolled. Addition of PO dilaudid 4mg  helped plus valium but now pt c/o significant nausea, dry heaves, has not yet eaten. Received 4mg  zofran, reglan ordered as well. C/o pain posterior left knee. Denies numbness or tingling.    Objective: Vital signs in last 24 hours: Temp:  [97.6 F (36.4 C)-98.8 F (37.1 C)] 98.6 F (37 C) (04/07 0538) Pulse Rate:  [64-101] 78 (04/07 0538) Resp:  [11-16] 15 (04/07 0538) BP: (100-140)/(28-94) 105/28 mmHg (04/07 0538) SpO2:  [97 %-100 %] 100 % (04/07 0538) Weight:  [75.297 kg (166 lb)] 75.297 kg (166 lb) (04/06 1400)  Intake/Output from previous day:  Intake/Output Summary (Last 24 hours) at 12/18/15 0806 Last data filed at 12/18/15 0551  Gross per 24 hour  Intake 2831.67 ml  Output   1375 ml  Net 1456.67 ml    Intake/Output this shift:    Labs: Results for orders placed or performed during the hospital encounter of 12/17/15  CBC  Result Value Ref Range   WBC 10.2 4.0 - 10.5 K/uL   RBC 3.94 3.87 - 5.11 MIL/uL   Hemoglobin 12.9 12.0 - 15.0 g/dL   HCT 37.5 36.0 - 46.0 %   MCV 95.2 78.0 - 100.0 fL   MCH 32.7 26.0 - 34.0 pg   MCHC 34.4 30.0 - 36.0 g/dL   RDW 12.7 11.5 - 15.5 %   Platelets 213 150 - 400 K/uL  Basic metabolic panel  Result Value Ref Range   Sodium 136 135 - 145 mmol/L   Potassium 3.4 (L) 3.5 - 5.1 mmol/L   Chloride 98 (L) 101 - 111 mmol/L   CO2 31 22 - 32 mmol/L   Glucose, Bld 131 (H) 65 - 99 mg/dL   BUN 6 6 - 20 mg/dL   Creatinine, Ser 0.53 0.44 - 1.00 mg/dL   Calcium 8.5 (L) 8.9 - 10.3 mg/dL   GFR  calc non Af Amer >60 >60 mL/min   GFR calc Af Amer >60 >60 mL/min   Anion gap 7 5 - 15    Exam - Neurologically intact ABD soft Neurovascular intact Sensation intact distally Intact pulses distally Dorsiflexion/Plantar flexion intact Incision: dressing C/D/I and no drainage No cellulitis present Compartment soft no sign of DVT Dressing - clean, dry, no drainage Motor function intact - moving foot and toes well on exam.   Assessment/Plan: 1 Day Post-Op Procedure(s) (LRB): LEFT TOTAL KNEE ARTHROPLASTY (Left)  Advance diet Up with therapy Past Medical History  Diagnosis Date  . Shingles   . Hypertension   . High cholesterol   . History of shingles     effecrt nerve sees neurologist in [facility]  . Diarrhea     "sometimes ., due to Gallbladder removed"  . Arthritis     DVT Prophylaxis - ASA 325mg  BID Weight-Bearing as tolerated to Left leg Keep foley until later today or tomorrow No vaccines. May need IVF bolus later- hypotensive this AM but most likely due to narcotic use. Expect BP to improve as she decreases narcotics but if not, plan bolus if needed Will back  off on narcotics- decrease dilaudid to 2mg  dose for refractory severe pain, oxy also ordered Start toradol 7.5mg  IV q6h to decrease inflammation Continue ice and elevation Increased zofran from 4 to 8mg  for nausea, reglan also ordered Will discuss with Dr. Tonita Cong Plan home with HHPT when ready- likely Sunday   Liu, Terri M. 12/18/2015, 8:06 AM

## 2015-12-19 LAB — CBC
HEMATOCRIT: 33.9 % — AB (ref 36.0–46.0)
Hemoglobin: 11.6 g/dL — ABNORMAL LOW (ref 12.0–15.0)
MCH: 32.9 pg (ref 26.0–34.0)
MCHC: 34.2 g/dL (ref 30.0–36.0)
MCV: 96 fL (ref 78.0–100.0)
Platelets: 191 10*3/uL (ref 150–400)
RBC: 3.53 MIL/uL — AB (ref 3.87–5.11)
RDW: 12.9 % (ref 11.5–15.5)
WBC: 7.2 10*3/uL (ref 4.0–10.5)

## 2015-12-19 MED ORDER — PREDNISOLONE ACETATE 1 % OP SUSP
1.0000 [drp] | Freq: Four times a day (QID) | OPHTHALMIC | Status: DC
  Administered 2015-12-19: 1 [drp] via OPHTHALMIC
  Filled 2015-12-19: qty 1

## 2015-12-19 MED ORDER — ASPIRIN EC 325 MG PO TBEC: 325.0000 mg | DELAYED_RELEASE_TABLET | Freq: Two times a day (BID) | ORAL | Status: DC

## 2015-12-19 NOTE — Progress Notes (Signed)
   Subjective: 2 Days Post-Op Procedure(s) (LRB): LEFT TOTAL KNEE ARTHROPLASTY (Left) Patient reports pain as mild.   Patient seen in rounds with Dr. Gladstone Lighter. Patient is well, and has had no acute complaints or problems Plan is to go Home after hospital stay.  Objective: Vital signs in last 24 hours: Temp:  [98.2 F (36.8 C)-99.1 F (37.3 C)] 99.1 F (37.3 C) (04/08 0630) Pulse Rate:  [83-97] 83 (04/08 0630) Resp:  [15-20] 20 (04/08 0630) BP: (116-131)/(46-67) 131/46 mmHg (04/08 0630) SpO2:  [91 %-96 %] 91 % (04/08 0630)  Intake/Output from previous day:  Intake/Output Summary (Last 24 hours) at 12/19/15 0741 Last data filed at 12/19/15 [id number]  Gross per 24 hour  Intake 1781.5 ml  Output    976 ml  Net  805.5 ml     Labs:  Recent Labs  12/18/15 0405 12/19/15 0442  HGB 12.9 11.6*    Recent Labs  12/18/15 0405 12/19/15 0442  WBC 10.2 7.2  RBC 3.94 3.53*  HCT 37.5 33.9*  PLT 213 191    Recent Labs  12/18/15 0405  NA 136  K 3.4*  CL 98*  CO2 31  BUN 6  CREATININE 0.53  GLUCOSE 131*  CALCIUM 8.5*   No results for input(s): LABPT, INR in the last 72 hours.  EXAM General - Patient is Alert, Appropriate and Oriented Extremity - Neurovascular intact Intact pulses distally Dressing - clean, dry Motor Function - intact, moving foot and toes well on exam.   Past Medical History  Diagnosis Date  . Shingles   . Hypertension   . High cholesterol   . History of shingles     effecrt nerve sees neurologist in [facility]  . Diarrhea     "sometimes ., due to Gallbladder removed"  . Arthritis     Assessment/Plan: 2 Days Post-Op Procedure(s) (LRB): LEFT TOTAL KNEE ARTHROPLASTY (Left) Principal Problem:   Primary osteoarthritis of left knee Active Problems:   Left knee DJD  Estimated body mass index is 28.48 kg/(m^2) as calculated from the following:   Height as of this encounter: 5\' 4"  (1.626 m).   Weight as of this encounter: 75.297 kg (166  lb). Up with therapy Plan for discharge tomorrow  DVT Prophylaxis - Aspirin Weight-Bearing as tolerated to left leg  Arlee Muslim, PA-C Orthopaedic Surgery 12/19/2015, 7:41 AM

## 2015-12-19 NOTE — Progress Notes (Signed)
Physical Therapy Treatment Patient Details Name: Terri Liu MRN: [id number] DOB: Mar 04, 1934 Today's Date: 12/19/2015    History of Present Illness s/p L TKA, h/o R TKA    PT Comments    Marked improvement in activity tolerance and performance of mobility tasks.  Pt hopeful for dc tomorrow.  Follow Up Recommendations  Home health PT     Equipment Recommendations  None recommended by PT    Recommendations for Other Services OT consult     Precautions / Restrictions Precautions Precautions: Knee;Fall Required Braces or Orthoses: Knee Immobilizer - Left Knee Immobilizer - Left: Discontinue once straight leg raise with < 10 degree lag Restrictions Weight Bearing Restrictions: No Other Position/Activity Restrictions: WBAT    Mobility  Bed Mobility               General bed mobility comments: NT - OOB with OT  Transfers Overall transfer level: Needs assistance Equipment used: Rolling walker (2 wheeled) Transfers: Sit to/from Stand Sit to Stand: Min guard;From elevated surface         General transfer comment: cues for UE and LE placement  Ambulation/Gait Ambulation/Gait assistance: Min assist;Min guard Ambulation Distance (Feet): 78 Feet (and 15' to bathroom) Assistive device: Rolling walker (2 wheeled) Gait Pattern/deviations: Step-to pattern;Decreased step length - right;Decreased step length - left;Shuffle;Trunk flexed Gait velocity: decr   General Gait Details: cues for sequence, posture, stride length and position from [employer]            Wheelchair Mobility    Modified Rankin (Stroke Patients Only)       Balance                                    Cognition Arousal/Alertness: Awake/alert Behavior During Therapy: WFL for tasks assessed/performed Overall Cognitive Status: Within Functional Limits for tasks assessed                      Exercises Total Joint Exercises Ankle Circles/Pumps:  AROM;Both;Supine;10 reps Quad Sets: AROM;Both;15 reps;Supine Heel Slides: AAROM;Right;15 reps;Supine Straight Leg Raises: AAROM;AROM;Right;15 reps;Supine Goniometric ROM: AAROM at R knee -10 - 45    General Comments        Pertinent Vitals/Pain Pain Assessment: 0-10 Pain Score: 4  Pain Location: L knee Pain Descriptors / Indicators: Aching;Sore Pain Intervention(s): Limited activity within patient's tolerance;Monitored during session;Premedicated before session;Ice applied    Home Living                      Prior Function            PT Goals (current goals can now be found in the care plan section) Acute Rehab PT Goals Patient Stated Goal: Walk with less pain PT Goal Formulation: With patient Time For Goal Achievement: 12/25/15 Potential to Achieve Goals: Good Progress towards PT goals: Progressing toward goals    Frequency  7X/week    PT Plan Current plan remains appropriate    Co-evaluation             End of Session Equipment Utilized During Treatment: Gait belt Activity Tolerance: Patient tolerated treatment well Patient left: in chair;with call bell/phone within reach;with chair alarm set     Time: 1130-1150 PT Time Calculation (min) (ACUTE ONLY): 20 min  Charges:  $Gait Training: 8-22 mins $Therapeutic Exercise: 8-22 mins  G Codes:      Marcial Pless 23-Dec-2015, 12:46 PM

## 2015-12-19 NOTE — Progress Notes (Signed)
Physical Therapy Treatment Patient Details Name: Terri Liu MRN: [id number] DOB: 1934/04/26 Today's Date: 2016/01/11    History of Present Illness s/p L TKA, h/o R TKA    PT Comments    Initiated therex program this am.    Follow Up Recommendations  Home health PT     Equipment Recommendations  None recommended by PT    Recommendations for Other Services OT consult     Precautions / Restrictions Precautions Precautions: Knee;Fall Required Braces or Orthoses: Knee Immobilizer - Left Knee Immobilizer - Left: Discontinue once straight leg raise with < 10 degree lag (Pt performed IND SLR this am) Restrictions Weight Bearing Restrictions: No Other Position/Activity Restrictions: WBAT    Mobility  Bed Mobility               General bed mobility comments: NT - OOB with OT  Transfers                    Ambulation/Gait                 Stairs            Wheelchair Mobility    Modified Rankin (Stroke Patients Only)       Balance                                    Cognition Arousal/Alertness: Awake/alert Behavior During Therapy: WFL for tasks assessed/performed Overall Cognitive Status: Within Functional Limits for tasks assessed                      Exercises Total Joint Exercises Ankle Circles/Pumps: AROM;Both;Supine;10 reps Quad Sets: AROM;Both;15 reps;Supine Heel Slides: AAROM;Right;15 reps;Supine Straight Leg Raises: AAROM;AROM;Right;15 reps;Supine Goniometric ROM: AAROM at R knee -10 - 45    General Comments        Pertinent Vitals/Pain Pain Assessment: 0-10 Pain Score: 4  Pain Location: L knee  Pain Descriptors / Indicators: Aching;Sore Pain Intervention(s): Limited activity within patient's tolerance;Monitored during session;Premedicated before session;Ice applied    Home Living                      Prior Function            PT Goals (current goals can now be found in the  care plan section) Acute Rehab PT Goals Patient Stated Goal: Walk with less pain PT Goal Formulation: With patient Time For Goal Achievement: 12/25/15 Potential to Achieve Goals: Good Progress towards PT goals: Progressing toward goals    Frequency  7X/week    PT Plan Current plan remains appropriate    Co-evaluation             End of Session   Activity Tolerance: Patient tolerated treatment well;Patient limited by pain Patient left: in chair;with call bell/phone within reach;with chair alarm set     Time: [id number] PT Time Calculation (min) (ACUTE ONLY): 14 min  Charges:  $Therapeutic Exercise: 8-22 mins                    G Codes:      Hakim Minniefield Jan 11, 2016, 12:42 PM

## 2015-12-19 NOTE — Progress Notes (Signed)
Physical Therapy Treatment Patient Details Name: Terri Liu MRN: [id number] DOB: 1934/07/18 Today's Date: 12/19/2015    History of Present Illness s/p L TKA, h/o R TKA    PT Comments    Pt progressing with mobility but limited this pm by increased pain with ambulation despite premed.  Follow Up Recommendations  Home health PT     Equipment Recommendations  None recommended by PT    Recommendations for Other Services OT consult     Precautions / Restrictions Precautions Precautions: Knee;Fall Required Braces or Orthoses: Knee Immobilizer - Left Knee Immobilizer - Left: Discontinue once straight leg raise with < 10 degree lag Restrictions Weight Bearing Restrictions: No Other Position/Activity Restrictions: WBAT    Mobility  Bed Mobility Overal bed mobility: Needs Assistance Bed Mobility: Sit to Supine       Sit to supine: Min assist   General bed mobility comments: cues for sequence and use of L LE to self assist  Transfers Overall transfer level: Needs assistance Equipment used: Rolling walker (2 wheeled) Transfers: Sit to/from Stand Sit to Stand: Min guard;From elevated surface         General transfer comment: cues for UE and LE placement  Ambulation/Gait Ambulation/Gait assistance: Min assist;Min guard Ambulation Distance (Feet): 100 Feet Assistive device: Rolling walker (2 wheeled) Gait Pattern/deviations: Step-to pattern;Decreased step length - right;Decreased step length - left;Shuffle;Trunk flexed Gait velocity: decr   General Gait Details: min cues for sequence, posture, stride length and position from [employer]            Wheelchair Mobility    Modified Rankin (Stroke Patients Only)       Balance                                    Cognition Arousal/Alertness: Awake/alert Behavior During Therapy: WFL for tasks assessed/performed Overall Cognitive Status: Within Functional Limits for tasks assessed                       Exercises      General Comments        Pertinent Vitals/Pain Pain Assessment: 0-10 Pain Score: 6  Pain Location: L knee Pain Descriptors / Indicators: Aching;Sore Pain Intervention(s): Limited activity within patient's tolerance;Monitored during session;Premedicated before session;Ice applied;Patient requesting pain meds-RN notified    Home Living                      Prior Function            PT Goals (current goals can now be found in the care plan section) Acute Rehab PT Goals Patient Stated Goal: Walk with less pain PT Goal Formulation: With patient Time For Goal Achievement: 12/25/15 Potential to Achieve Goals: Good Progress towards PT goals: Progressing toward goals    Frequency  7X/week    PT Plan Current plan remains appropriate    Co-evaluation             End of Session Equipment Utilized During Treatment: Gait belt Activity Tolerance: Patient tolerated treatment well Patient left: in bed;with call bell/phone within reach;with bed alarm set     Time: [id number] PT Time Calculation (min) (ACUTE ONLY): 16 min  Charges:  $Gait Training: 8-22 mins                    G Codes:  [name] 12/19/2015, 2:24 PM

## 2015-12-19 NOTE — Discharge Instructions (Signed)
Elevate leg above heart 6x a day for 32minutes each Use knee immobilizer while walking until can SLR x 10 Use knee immobilizer in bed to keep knee in extension Aquacel dressing may remain in place until follow up. May shower with aquacel dressing in place. If the dressing becomes saturated or peels off, you may remove aquacel dressing. Do not remove steri-strips if they are present. Place new dressing with gauze and tape or ACE bandage which should be kept clean and dry and changed daily. Take 325mg  Aspirin twice daily to prevent blood clots  INSTRUCTIONS AFTER JOINT REPLACEMENT   o Remove items at home which could result in a fall. This includes throw rugs or furniture in walking pathways o ICE to the affected joint every three hours while awake for 30 minutes at a time, for at least the first 3-5 days, and then as needed for pain and swelling.  Continue to use ice for pain and swelling. You may notice swelling that will progress down to the foot and ankle.  This is normal after surgery.  Elevate your leg when you are not up walking on it.   o Continue to use the breathing machine you got in the hospital (incentive spirometer) which will help keep your temperature down.  It is common for your temperature to cycle up and down following surgery, especially at night when you are not up moving around and exerting yourself.  The breathing machine keeps your lungs expanded and your temperature down.   DIET:  As you were doing prior to hospitalization, we recommend a well-balanced diet.  DRESSING / WOUND CARE / SHOWERING    ACTIVITY  o Increase activity slowly as tolerated, but follow the weight bearing instructions below.   o No driving for 6 weeks or until further direction given by your physician.  You cannot drive while taking narcotics.  o No lifting or carrying greater than 10 lbs. until further directed by your surgeon. o Avoid periods of inactivity such as sitting longer than an hour when  not asleep. This helps prevent blood clots.  o You may return to work once you are authorized by your doctor.     WEIGHT BEARING   Other:  Weight bear as tolerated   EXERCISES  Results after joint replacement surgery are often greatly improved when you follow the exercise, range of motion and muscle strengthening exercises prescribed by your doctor. Safety measures are also important to protect the joint from further injury. Any time any of these exercises cause you to have increased pain or swelling, decrease what you are doing until you are comfortable again and then slowly increase them. If you have problems or questions, call your caregiver or physical therapist for advice.   Rehabilitation is important following a joint replacement. After just a few days of immobilization, the muscles of the leg can become weakened and shrink (atrophy).  These exercises are designed to build up the tone and strength of the thigh and leg muscles and to improve motion. Often times heat used for twenty to thirty minutes before working out will loosen up your tissues and help with improving the range of motion but do not use heat for the first two weeks following surgery (sometimes heat can increase post-operative swelling).   These exercises can be done on a training (exercise) mat, on the floor, on a table or on a bed. Use whatever works the best and is most comfortable for you.    Use music  or television while you are exercising so that the exercises are a pleasant break in your day. This will make your life better with the exercises acting as a break in your routine that you can look forward to.   Perform all exercises about fifteen times, three times per day or as directed.  You should exercise both the operative leg and the other leg as well.  Exercises include:    Quad Sets - Tighten up the muscle on the front of the thigh (Quad) and hold for 5-10 seconds.    Straight Leg Raises - With your knee  straight (if you were given a brace, keep it on), lift the leg to 60 degrees, hold for 3 seconds, and slowly lower the leg.  Perform this exercise against resistance later as your leg gets stronger.   Leg Slides: Lying on your back, slowly slide your foot toward your buttocks, bending your knee up off the floor (only go as far as is comfortable). Then slowly slide your foot back down until your leg is flat on the floor again.   Angel Wings: Lying on your back spread your legs to the side as far apart as you can without causing discomfort.   Hamstring Strength:  Lying on your back, push your heel against the floor with your leg straight by tightening up the muscles of your buttocks.  Repeat, but this time bend your knee to a comfortable angle, and push your heel against the floor.  You may put a pillow under the heel to make it more comfortable if necessary.   A rehabilitation program following joint replacement surgery can speed recovery and prevent re-injury in the future due to weakened muscles. Contact your doctor or a physical therapist for more information on knee rehabilitation.    CONSTIPATION  Constipation is defined medically as fewer than three stools per week and severe constipation as less than one stool per week.  Even if you have a regular bowel pattern at home, your normal regimen is likely to be disrupted due to multiple reasons following surgery.  Combination of anesthesia, postoperative narcotics, change in appetite and fluid intake all can affect your bowels.   YOU MUST use at least one of the following options; they are listed in order of increasing strength to get the job done.  They are all available over the counter, and you may need to use some, POSSIBLY even all of these options:    Drink plenty of fluids (prune juice may be helpful) and high fiber foods Colace 100 mg by mouth twice a day  Senokot for constipation as directed and as needed Dulcolax (bisacodyl), take with  full glass of water  Miralax (polyethylene glycol) once or twice a day as needed.  If you have tried all these things and are unable to have a bowel movement in the first 3-4 days after surgery call either your surgeon or your primary doctor.    If you experience loose stools or diarrhea, hold the medications until you stool forms back up.  If your symptoms do not get better within 1 week or if they get worse, check with your doctor.  If you experience "the worst abdominal pain ever" or develop nausea or vomiting, please contact the office immediately for further recommendations for treatment.   ITCHING:  If you experience itching with your medications, try taking only a single pain pill, or even half a pain pill at a time.  You can also use Benadryl  over the counter for itching or also to help with sleep.   TED HOSE STOCKINGS:  Use stockings on both legs until for at least 2 weeks or as directed by physician office. They may be removed at night for sleeping.  MEDICATIONS:  See your medication summary on the After Visit Summary that nursing will review with you.  You may have some home medications which will be placed on hold until you complete the course of blood thinner medication.  It is important for you to complete the blood thinner medication as prescribed.  PRECAUTIONS:  If you experience chest pain or shortness of breath - call 911 immediately for transfer to the hospital emergency department.   If you develop a fever greater that 101 F, purulent drainage from wound, increased redness or drainage from wound, foul odor from the wound/dressing, or calf pain - CONTACT YOUR SURGEON.                                                   FOLLOW-UP APPOINTMENTS:  If you do not already have a post-op appointment, please call the office for an appointment to be seen by your surgeon.  Guidelines for how soon to be seen are listed in your After Visit Summary, but are typically between 1-4 weeks after  surgery.  OTHER INSTRUCTIONS:   Knee Replacement:  Do not place pillow under knee, focus on keeping the knee straight while resting. CPM instructions: 0-90 degrees, 2 hours in the morning, 2 hours in the afternoon, and 2 hours in the evening. Place foam block, curve side up under heel at all times except when in CPM or when walking.  DO NOT modify, tear, cut, or change the foam block in any way.  MAKE SURE YOU:   Understand these instructions.   Get help right away if you are not doing well or get worse.    Thank you for letting us be a part of your medical care team.  It is a privilege we respect greatly.  We hope these instructions will help you stay on track for a fast and full recovery!

## 2015-12-19 NOTE — Progress Notes (Signed)
Occupational Therapy Treatment Patient Details Name: YEILY KOCUR MRN: [id number] DOB: 20-Oct-1933 Today's Date: 12/19/2015    History of present illness s/p L TKA, h/o R TKA   OT comments  Tolerated session well and verbalized understanding of all education  Follow Up Recommendations  No OT follow up;Supervision/Assistance - 24 hour    Equipment Recommendations  3 in 1 bedside comode (pt does not have this)    Recommendations for Other Services      Precautions / Restrictions Precautions Precautions: Knee;Fall Required Braces or Orthoses: Knee Immobilizer - Left Knee Immobilizer - Left: Discontinue once straight leg raise with < 10 degree lag Restrictions Weight Bearing Restrictions: No       Mobility Bed Mobility         Supine to sit: Min assist     General bed mobility comments: HOB raised; assist for LLE.  Pt plans to sleep in lift chair  Transfers   Equipment used: Rolling walker (2 wheeled) Transfers: Sit to/from Stand Sit to Stand: Min guard;From elevated surface         General transfer comment: cues for UE and LE placement    Balance                                   ADL       Grooming: Oral care;Supervision/safety;Standing                   Toilet Transfer: Min guard;Ambulation;Comfort height toilet;Grab bars   Toileting- Clothing Manipulation and Hygiene: Modified independent;Sitting/lateral lean         General ADL Comments: reviewed shower transfer and gave pt a handout. She did not feel she needed to practice. She had TKA done several years ago.  She states that husband will be able to assist with adls.  Pt feeling much better today:  requested to brush teeth while in bathroom  Reviewed precautions      Vision                     Perception     Praxis      Cognition   Behavior During Therapy: [facility] for tasks assessed/performed Overall Cognitive Status: Within Functional Limits for tasks  assessed                       Extremity/Trunk Assessment               Exercises     Shoulder Instructions       General Comments      Pertinent Vitals/ Pain       Pain Score: 4  Pain Location: L knee Pain Descriptors / Indicators: Aching Pain Intervention(s): Limited activity within patient's tolerance;Monitored during session;Premedicated before session;Repositioned (SN to bring ice)  Home Living                                          Prior Functioning/Environment              Frequency       Progress Toward Goals  OT Goals(current goals can now be found in the care plan section)  Progress towards OT goals: Progressing toward goals (no further OT needs)     Plan      Co-evaluation  End of Session     Activity Tolerance Patient tolerated treatment well   Patient Left in chair;with call bell/phone within reach;with chair alarm set;with nursing/sitter in room   Nurse Communication          Time: [id number] OT Time Calculation (min): 26 min  Charges: OT General Charges $OT Visit: 1 Procedure OT Treatments $Self Care/Home Management : 23-37 mins  Marry Kusch 12/19/2015, 8:52 AM  Lesle Chris, OTR/L [phone] 12/19/2015

## 2015-12-20 LAB — CBC
HEMATOCRIT: 34.5 % — AB (ref 36.0–46.0)
Hemoglobin: 11.8 g/dL — ABNORMAL LOW (ref 12.0–15.0)
MCH: 33 pg (ref 26.0–34.0)
MCHC: 34.2 g/dL (ref 30.0–36.0)
MCV: 96.4 fL (ref 78.0–100.0)
Platelets: 211 10*3/uL (ref 150–400)
RBC: 3.58 MIL/uL — ABNORMAL LOW (ref 3.87–5.11)
RDW: 12.9 % (ref 11.5–15.5)
WBC: 9.5 10*3/uL (ref 4.0–10.5)

## 2015-12-20 NOTE — Progress Notes (Signed)
     Subjective: 3 Days Post-Op Procedure(s) (LRB): LEFT TOTAL KNEE ARTHROPLASTY (Left)   Patient reports pain as mild, pain controlled.  No events throughout the night.  Feels that the current analgesic medication is controlling her pain well. Feels that she is ready to be discharged home.  Objective:   VITALS:   Filed Vitals:   12/19/15 2019 12/20/15 0449  BP: 132/50 115/47  Pulse: 91 88  Temp: 100.1 F (37.8 C) 99.5 F (37.5 C)  Resp: 18 18    Dorsiflexion/Plantar flexion intact Incision: dressing C/D/I No cellulitis present Compartment soft  LABS  Recent Labs  12/18/15 0405 12/19/15 0442 12/20/15 0428  HGB 12.9 11.6* 11.8*  HCT 37.5 33.9* 34.5*  WBC 10.2 7.2 9.5  PLT 213 191 211     Recent Labs  12/18/15 0405  NA 136  K 3.4*  BUN 6  CREATININE 0.53  GLUCOSE 131*     Assessment/Plan: 3 Days Post-Op Procedure(s) (LRB): LEFT TOTAL KNEE ARTHROPLASTY (Left) Up with therapy Discharge home with home health  Follow up in 2 weeks at [facility]. Follow up with Dr. Tonita Cong in 2 weeks.  Contact information:  [facility] [address], [address] [place]   PAC  12/20/2015, 8:40 AM

## 2015-12-20 NOTE — Care Management Note (Signed)
Case Management Note  Patient Details  Name: Terri Liu MRN: [id number] Date of Birth: 02-Aug-1934  Subjective/Objective:     LEFT TOTAL KNEE ARTHROPLASTY                Action/Plan: Discharge Planning: AVS reviewed:  10:15 am NCM spoke to pt and husband at bedside. Pt requesting RW and 3n1 for home. Pt [place] arranged with [facility]. Contacted [place] liaison to make aware of scheduled dc home today with HH. Contacted [facility] DME rep for equipment delivered to room prior to dc.    Expected Discharge Date:  12/20/2015            Expected Discharge Plan:  [place]  In-House Referral:  NA  Discharge planning Services  CM Consult  Post Acute Care Choice:  Home Health Choice offered to:  Patient  DME Arranged:  3-N-1, Walker rolling DME Agency:  NA, [place]:  PT [place]:  [place]  Status of Service:  Completed, signed off  Medicare Important Message Given:  Yes Date Medicare IM Given:    Medicare IM give by:    Date Additional Medicare IM Given:    Additional Medicare Important Message give by:     If discussed at [place] of Stay Meetings, dates discussed:    Additional Comments:  Erenest Rasher, RN 12/20/2015, 6:05 PM

## 2015-12-20 NOTE — Progress Notes (Signed)
Physical Therapy Treatment Patient Details Name: SAMIYA CARNS MRN: [id number] DOB: [birth year] Today's Date: 12/20/2015    History of Present Illness s/p L TKA, h/o R TKA    PT Comments    Pt progressing well and eager for return home.  Reviewed therex and stairs.  Spouse present.  Follow Up Recommendations  Home health PT     Equipment Recommendations  None recommended by PT    Recommendations for Other Services OT consult     Precautions / Restrictions Precautions Precautions: Knee;Fall Required Braces or Orthoses: Knee Immobilizer - Left Knee Immobilizer - Left: Discontinue once straight leg raise with < 10 degree lag (Pt performed IND SLR this am) Restrictions Weight Bearing Restrictions: No Other Position/Activity Restrictions: WBAT    Mobility  Bed Mobility Overal bed mobility: Needs Assistance Bed Mobility: Supine to Sit     Supine to sit: Supervision     General bed mobility comments: min cues for sequence and use of L LE to self assist  Transfers Overall transfer level: Needs assistance Equipment used: Rolling walker (2 wheeled) Transfers: Sit to/from Stand Sit to Stand: Supervision         General transfer comment: cues for UE and LE placement  Ambulation/Gait Ambulation/Gait assistance: Min guard;Supervision Ambulation Distance (Feet): 140 Feet Assistive device: Rolling walker (2 wheeled) Gait Pattern/deviations: Step-to pattern;Decreased step length - right;Decreased step length - left;Shuffle;Trunk flexed Gait velocity: decr   General Gait Details: min cues for sequence, posture, and position from RW   Stairs Stairs: Yes Stairs assistance: Min assist Stair Management: One rail Left;Step to pattern;Forwards;With cane Number of Stairs: 5 General stair comments: cues for sequence and foot/cane placement  Wheelchair Mobility    Modified Rankin (Stroke Patients Only)       Balance                                     Cognition Arousal/Alertness: Awake/alert Behavior During Therapy: WFL for tasks assessed/performed Overall Cognitive Status: Within Functional Limits for tasks assessed                      Exercises Total Joint Exercises Ankle Circles/Pumps: AROM;Both;Supine;20 reps Quad Sets: AROM;Both;Supine;20 reps Heel Slides: AAROM;Right;Supine;20 reps Straight Leg Raises: AAROM;AROM;Right;Supine;20 reps Long Arc Quad: AAROM;AROM;10 reps;Seated;Left Goniometric ROM: AAROM at R knee -8 - 45    General Comments        Pertinent Vitals/Pain Pain Assessment: 0-10 Pain Score: 4  Pain Location: L knee Pain Descriptors / Indicators: Aching;Sore Pain Intervention(s): Limited activity within patient's tolerance;Monitored during session;Premedicated before session;Ice applied    Home Living                      Prior Function            PT Goals (current goals can now be found in the care plan section) Acute Rehab PT Goals Patient Stated Goal: Walk with less pain PT Goal Formulation: With patient Time For Goal Achievement: 12/25/15 Potential to Achieve Goals: Good Progress towards PT goals: Progressing toward goals    Frequency  7X/week    PT Plan Current plan remains appropriate    Co-evaluation             End of Session Equipment Utilized During Treatment: Gait belt Activity Tolerance: Patient tolerated treatment well Patient left: in chair;with call bell/phone within reach;with chair alarm  set;with family/visitor present     Time: 0928-1010 PT Time Calculation (min) (ACUTE ONLY): 42 min  Charges:  $Gait Training: 23-37 mins $Therapeutic Exercise: 8-22 mins                    G Codes:      Jabree Pernice 2015/12/28, 11:26 AM

## 2015-12-20 NOTE — Progress Notes (Signed)
Pt discharged with prescriptions and dc paperwork; escorted to lobby safely in wheelchair accompanied by nurse tech and RN. Assisted in car. Home with spouse.

## 2015-12-20 NOTE — Care Management Important Message (Signed)
Important Message  Patient Details  Name: Terri Liu MRN: [id number] Date of Birth: Feb 17, 1934   Medicare Important Message Given:  Yes    Erenest Rasher, RN 12/20/2015, 10:17 AM

## 2015-12-21 NOTE — Discharge Summary (Signed)
Physician Discharge Summary   Patient ID: Terri Liu MRN: [id number] DOB/AGE: 1934-06-11 82 y.o.  Admit date: 12/17/2015 Discharge date: 12/20/2015  Primary Diagnosis: Left knee primary osteoarthritis  Admission Diagnoses:  Past Medical History  Diagnosis Date  . Shingles   . Hypertension   . High cholesterol   . History of shingles     effecrt nerve sees neurologist in [facility]  . Diarrhea     "sometimes ., due to Gallbladder removed"  . Arthritis    Discharge Diagnoses:   Principal Problem:   Primary osteoarthritis of left knee Active Problems:   Left knee DJD  Estimated body mass index is 28.48 kg/(m^2) as calculated from the following:   Height as of this encounter: 5\' 4"  (1.626 m).   Weight as of this encounter: 75.297 kg (166 lb).  Procedure:  Procedure(s) (LRB): LEFT TOTAL KNEE ARTHROPLASTY (Left)   Consults: None  HPI: see H&P Laboratory Data: Admission on 12/17/2015, Discharged on 12/20/2015  Component Date Value Ref Range Status  . WBC 12/18/2015 10.2  4.0 - 10.5 K/uL Final  . RBC 12/18/2015 3.94  3.87 - 5.11 MIL/uL Final  . Hemoglobin 12/18/2015 12.9  12.0 - 15.0 g/dL Final  . HCT 02/17/2016 37.5  36.0 - 46.0 % Final  . MCV 12/18/2015 95.2  78.0 - 100.0 fL Final  . MCH 12/18/2015 32.7  26.0 - 34.0 pg Final  . MCHC 12/18/2015 34.4  30.0 - 36.0 g/dL Final  . RDW 02/17/2016 12.7  11.5 - 15.5 % Final  . Platelets 12/18/2015 213  150 - 400 K/uL Final  . Sodium 12/18/2015 136  135 - 145 mmol/L Final  . Potassium 12/18/2015 3.4* 3.5 - 5.1 mmol/L Final  . Chloride 12/18/2015 98* 101 - 111 mmol/L Final  . CO2 12/18/2015 31  22 - 32 mmol/L Final  . Glucose, Bld 12/18/2015 131* 65 - 99 mg/dL Final  . BUN 02/17/2016 6  6 - 20 mg/dL Final  . Creatinine, Ser 12/18/2015 0.53  0.44 - 1.00 mg/dL Final  . Calcium 02/17/2016 8.5* 8.9 - 10.3 mg/dL Final  . GFR calc non Af Amer 12/18/2015 >60  >60 mL/min Final  . GFR calc Af Amer 12/18/2015 >60  >60 mL/min Final     Comment: (NOTE) The eGFR has been calculated using the CKD EPI equation. This calculation has not been validated in all clinical situations. eGFR's persistently <60 mL/min signify possible Chronic Kidney Disease.   . Anion gap 12/18/2015 7  5 - 15 Final  . WBC 12/19/2015 7.2  4.0 - 10.5 K/uL Final  . RBC 12/19/2015 3.53* 3.87 - 5.11 MIL/uL Final  . Hemoglobin 12/19/2015 11.6* 12.0 - 15.0 g/dL Final  . HCT 02/18/2016 33.9* 36.0 - 46.0 % Final  . MCV 12/19/2015 96.0  78.0 - 100.0 fL Final  . MCH 12/19/2015 32.9  26.0 - 34.0 pg Final  . MCHC 12/19/2015 34.2  30.0 - 36.0 g/dL Final  . RDW 02/18/2016 12.9  11.5 - 15.5 % Final  . Platelets 12/19/2015 191  150 - 400 K/uL Final  . WBC 12/20/2015 9.5  4.0 - 10.5 K/uL Final  . RBC 12/20/2015 3.58* 3.87 - 5.11 MIL/uL Final  . Hemoglobin 12/20/2015 11.8* 12.0 - 15.0 g/dL Final  . HCT 02/19/2016 34.5* 36.0 - 46.0 % Final  . MCV 12/20/2015 96.4  78.0 - 100.0 fL Final  . MCH 12/20/2015 33.0  26.0 - 34.0 pg Final  . MCHC 12/20/2015 34.2  30.0 -  36.0 g/dL Final  . RDW 12/20/2015 12.9  11.5 - 15.5 % Final  . Platelets 12/20/2015 211  150 - 400 K/uL Final  Hospital Outpatient Visit on 12/09/2015  Component Date Value Ref Range Status  . WBC 12/09/2015 6.2  4.0 - 10.5 K/uL Final  . RBC 12/09/2015 4.45  3.87 - 5.11 MIL/uL Final  . Hemoglobin 12/09/2015 14.3  12.0 - 15.0 g/dL Final  . HCT 12/09/2015 41.2  36.0 - 46.0 % Final  . MCV 12/09/2015 92.6  78.0 - 100.0 fL Final  . MCH 12/09/2015 32.1  26.0 - 34.0 pg Final  . MCHC 12/09/2015 34.7  30.0 - 36.0 g/dL Final  . RDW 12/09/2015 12.4  11.5 - 15.5 % Final  . Platelets 12/09/2015 257  150 - 400 K/uL Final  . Sodium 12/09/2015 140  135 - 145 mmol/L Final  . Potassium 12/09/2015 4.0  3.5 - 5.1 mmol/L Final  . Chloride 12/09/2015 97* 101 - 111 mmol/L Final  . CO2 12/09/2015 34* 22 - 32 mmol/L Final  . Glucose, Bld 12/09/2015 107* 65 - 99 mg/dL Final  . BUN 12/09/2015 9  6 - 20 mg/dL Final  .  Creatinine, Ser 12/09/2015 0.52  0.44 - 1.00 mg/dL Final  . Calcium 12/09/2015 9.7  8.9 - 10.3 mg/dL Final  . Total Protein 12/09/2015 6.8  6.5 - 8.1 g/dL Final  . Albumin 12/09/2015 4.2  3.5 - 5.0 g/dL Final  . AST 12/09/2015 29  15 - 41 U/L Final  . ALT 12/09/2015 21  14 - 54 U/L Final  . Alkaline Phosphatase 12/09/2015 55  38 - 126 U/L Final  . Total Bilirubin 12/09/2015 1.3* 0.3 - 1.2 mg/dL Final  . GFR calc non Af Amer 12/09/2015 >60  >60 mL/min Final  . GFR calc Af Amer 12/09/2015 >60  >60 mL/min Final   Comment: (NOTE) The eGFR has been calculated using the CKD EPI equation. This calculation has not been validated in all clinical situations. eGFR's persistently <60 mL/min signify possible Chronic Kidney Disease.   . Anion gap 12/09/2015 9  5 - 15 Final  . Prothrombin Time 12/09/2015 13.5  11.6 - 15.2 seconds Final  . INR 12/09/2015 1.01  0.00 - 1.49 Final  . ABO/RH(D) 12/09/2015 A POS   Final  . Antibody Screen 12/09/2015 NEG   Final  . Sample Expiration 12/09/2015 12/20/2015   Final  . Extend sample reason 12/09/2015 NO TRANSFUSIONS OR PREGNANCY IN THE PAST 3 MONTHS   Final  . Color, Urine 12/09/2015 YELLOW  YELLOW Final  . APPearance 12/09/2015 CLEAR  CLEAR Final  . Specific Gravity, Urine 12/09/2015 1.008  1.005 - 1.030 Final  . pH 12/09/2015 7.5  5.0 - 8.0 Final  . Glucose, UA 12/09/2015 NEGATIVE  NEGATIVE mg/dL Final  . Hgb urine dipstick 12/09/2015 NEGATIVE  NEGATIVE Final  . Bilirubin Urine 12/09/2015 NEGATIVE  NEGATIVE Final  . Ketones, ur 12/09/2015 NEGATIVE  NEGATIVE mg/dL Final  . Protein, ur 12/09/2015 NEGATIVE  NEGATIVE mg/dL Final  . Nitrite 12/09/2015 NEGATIVE  NEGATIVE Final  . Leukocytes, UA 12/09/2015 SMALL* NEGATIVE Final  . Squamous Epithelial / LPF 12/09/2015 0-5* NONE SEEN Final  . WBC, UA 12/09/2015 0-5  0 - 5 WBC/hpf Final  . RBC / HPF 12/09/2015 NONE SEEN  0 - 5 RBC/hpf Final  . Bacteria, UA 12/09/2015 RARE* NONE SEEN Final  . MRSA, PCR 12/09/2015  NEGATIVE  NEGATIVE Final  . Staphylococcus aureus 12/09/2015 NEGATIVE  NEGATIVE Final  Comment:        The Xpert SA Assay (FDA approved for NASAL specimens in patients over 62 years of age), is one component of a comprehensive surveillance program.  Test performance has been validated by [facility] for patients greater than or equal to 32 year old. It is not intended to diagnose infection nor to guide or monitor treatment.      X-Rays:Dg Knee 1-2 Views Left  12/09/2015  CLINICAL DATA:  Preoperative exam prior to left knee replacement for osteoarthritis. EXAM: LEFT KNEE - 1-2 VIEW COMPARISON:  None in PACs FINDINGS: The bones appear reasonably well mineralized. There is marked narrowing of the medial joint compartment with milder narrowing laterally. There is beaking of the tibial spines. Osteophytes arise from the articular margins of the tibial plateaus and the medial femoral condyles. There are osteophytes arising from the articular margins of the patella. There is a amorphous calcification in the popliteal fossa which may lie within a popliteal cyst. IMPRESSION: Moderate osteoarthritic change centered on the medial joint compartment with milder degenerative changes in the lateral compartment and patellofemoral compartments. There is no acute bony abnormality. Electronically Signed   By: David  Martinique M.D.   On: 12/09/2015 13:22   Dg Knee Left Port  12/17/2015  CLINICAL DATA:  Status post left total knee replacement EXAM: PORTABLE LEFT KNEE - 1-2 VIEW COMPARISON:  12/09/2015 FINDINGS: There are postoperative changes from a left total knee arthroplasty. No periprosthetic fracture or dislocation. Gas is identified within the joint space. A large loose body is identified posterior to the knee. IMPRESSION: 1. No complications after left total knee arthroplasty. 2. Large loose body is again noted posterior to the knee. Electronically Signed   By: Kerby Moors M.D.   On: 12/17/2015 13:01     EKG: Orders placed or performed during the hospital encounter of 01/27/15  . EKG test  . EKG test     Hospital Course: Terri Liu is a 82 y.o. who was admitted to [facility]. They were brought to the operating room on 12/17/2015 and underwent Procedure(s): LEFT TOTAL KNEE ARTHROPLASTY.  Patient tolerated the procedure well and was later transferred to the recovery room and then to the orthopaedic floor for postoperative care.  They were given PO and IV analgesics for pain control following their surgery.  They were given 24 hours of postoperative antibiotics of  Anti-infectives    Start     Dose/Rate Route Frequency Ordered Stop   12/17/15 1600  ceFAZolin (ANCEF) IVPB 2g/100 mL premix     2 g 200 mL/hr over 30 Minutes Intravenous Every 6 hours 12/17/15 1402 12/18/15 0408   12/17/15 1043  polymyxin B 500,000 Units, bacitracin 50,000 Units in sodium chloride irrigation 0.9 % 500 mL irrigation  Status:  Discontinued       As needed 12/17/15 1059 12/17/15 1228   12/17/15 0930  ceFAZolin (ANCEF) 2 g in dextrose 5 % 50 mL IVPB     2 g 140 mL/hr over 30 Minutes Intravenous On call to O.R. 12/17/15 [id number] 12/17/15 1023     and started on DVT prophylaxis in the form of Aspirin, TED hose and SCDs.   PT and OT were ordered for total joint protocol.  Discharge planning consulted to help with postop disposition and equipment needs.  Patient had a difficult night on the evening of surgery due to pain and nausea.  They started to get up OOB with therapy on day one. Continued to  work with therapy into day two.  By day three, the patient had progressed with therapy and meeting their goals.  Incision was healing well.  Patient was seen in rounds and was ready to go home.   Diet: Regular diet Activity:WBAT Follow-up:in 10-14 days Disposition - Home Discharged Condition: good      Medication List    STOP taking these medications        bacitracin-polymyxin b ophthalmic ointment   Commonly known as:  POLYSPORIN     brimonidine 0.2 % ophthalmic solution  Commonly known as:  ALPHAGAN     gatifloxacin 0.5 % Soln  Commonly known as:  ZYMAXID     HAIR SKIN AND NAILS FORMULA Tabs     HYDROcodone-acetaminophen 5-325 MG tablet  Commonly known as:  NORCO/VICODIN     lovastatin 40 MG tablet  Commonly known as:  MEVACOR     TART CHERRY ADVANCED PO      TAKE these medications        acetaminophen 500 MG tablet  Commonly known as:  TYLENOL  Take 500 mg by mouth every 6 (six) hours as needed (Pain).     aspirin EC 325 MG tablet  Take 1 tablet (325 mg total) by mouth 2 (two) times daily.     atropine 1 % ophthalmic solution  Place 1 drop into the right eye 2 (two) times daily.     calcium carbonate 600 MG Tabs tablet  Commonly known as:  OS-CAL  Take 600 mg by mouth daily.     docusate sodium 100 MG capsule  Commonly known as:  COLACE  Take 1 capsule (100 mg total) by mouth 2 (two) times daily as needed for mild constipation.     hydrochlorothiazide 25 MG tablet  Commonly known as:  HYDRODIURIL  Take 25 mg by mouth at bedtime.     OXcarbazepine 150 MG tablet  Commonly known as:  TRILEPTAL  Take 150 mg by mouth at bedtime.     oxyCODONE-acetaminophen 5-325 MG tablet  Commonly known as:  PERCOCET  Take 1-2 tablets by mouth every 4 (four) hours as needed for severe pain.     prednisoLONE acetate 1 % ophthalmic suspension  Commonly known as:  PRED FORTE  Place 1 drop into the right eye 4 (four) times daily.     prenatal multivitamin Tabs tablet  Take 1 tablet by mouth every morning.     valsartan 160 MG tablet  Commonly known as:  DIOVAN  Take 160 mg by mouth at bedtime.           Follow-up Information    Follow up with BEANE,JEFFREY C, MD In 2 weeks.   Specialty:  Orthopedic Surgery   Contact information:   [address] [place] [address] [phone]       Follow up with [facility].   Why:   physical therapy   Contact information:   [place] [address] [phone]       Signed: Lacie Draft, PA-C Orthopaedic Surgery 12/21/2015, 8:34 AM

## 2015-12-22 DIAGNOSIS — I10 Essential (primary) hypertension: Secondary | ICD-10-CM | POA: Diagnosis not present

## 2015-12-22 DIAGNOSIS — Z471 Aftercare following joint replacement surgery: Secondary | ICD-10-CM | POA: Diagnosis not present

## 2015-12-22 DIAGNOSIS — Z96652 Presence of left artificial knee joint: Secondary | ICD-10-CM | POA: Diagnosis not present

## 2015-12-25 DIAGNOSIS — I10 Essential (primary) hypertension: Secondary | ICD-10-CM | POA: Diagnosis not present

## 2015-12-25 DIAGNOSIS — Z96652 Presence of left artificial knee joint: Secondary | ICD-10-CM | POA: Diagnosis not present

## 2015-12-25 DIAGNOSIS — Z471 Aftercare following joint replacement surgery: Secondary | ICD-10-CM | POA: Diagnosis not present

## 2015-12-28 DIAGNOSIS — Z471 Aftercare following joint replacement surgery: Secondary | ICD-10-CM | POA: Diagnosis not present

## 2015-12-28 DIAGNOSIS — I10 Essential (primary) hypertension: Secondary | ICD-10-CM | POA: Diagnosis not present

## 2015-12-28 DIAGNOSIS — Z96652 Presence of left artificial knee joint: Secondary | ICD-10-CM | POA: Diagnosis not present

## 2015-12-29 DIAGNOSIS — Z471 Aftercare following joint replacement surgery: Secondary | ICD-10-CM | POA: Diagnosis not present

## 2015-12-29 DIAGNOSIS — S335XXD Sprain of ligaments of lumbar spine, subsequent encounter: Secondary | ICD-10-CM | POA: Diagnosis not present

## 2015-12-29 DIAGNOSIS — Z96652 Presence of left artificial knee joint: Secondary | ICD-10-CM | POA: Diagnosis not present

## 2015-12-30 DIAGNOSIS — I10 Essential (primary) hypertension: Secondary | ICD-10-CM | POA: Diagnosis not present

## 2015-12-30 DIAGNOSIS — Z471 Aftercare following joint replacement surgery: Secondary | ICD-10-CM | POA: Diagnosis not present

## 2015-12-30 DIAGNOSIS — Z96652 Presence of left artificial knee joint: Secondary | ICD-10-CM | POA: Diagnosis not present

## 2016-01-05 DIAGNOSIS — M1712 Unilateral primary osteoarthritis, left knee: Secondary | ICD-10-CM | POA: Diagnosis not present

## 2016-01-07 DIAGNOSIS — M1712 Unilateral primary osteoarthritis, left knee: Secondary | ICD-10-CM | POA: Diagnosis not present

## 2016-01-12 DIAGNOSIS — M1712 Unilateral primary osteoarthritis, left knee: Secondary | ICD-10-CM | POA: Diagnosis not present

## 2016-01-14 DIAGNOSIS — M1712 Unilateral primary osteoarthritis, left knee: Secondary | ICD-10-CM | POA: Diagnosis not present

## 2016-01-19 DIAGNOSIS — M1712 Unilateral primary osteoarthritis, left knee: Secondary | ICD-10-CM | POA: Diagnosis not present

## 2016-01-21 DIAGNOSIS — M1712 Unilateral primary osteoarthritis, left knee: Secondary | ICD-10-CM | POA: Diagnosis not present

## 2016-01-26 DIAGNOSIS — Z471 Aftercare following joint replacement surgery: Secondary | ICD-10-CM | POA: Diagnosis not present

## 2016-01-26 DIAGNOSIS — Z96652 Presence of left artificial knee joint: Secondary | ICD-10-CM | POA: Diagnosis not present

## 2016-01-26 DIAGNOSIS — M1712 Unilateral primary osteoarthritis, left knee: Secondary | ICD-10-CM | POA: Diagnosis not present

## 2016-02-12 DIAGNOSIS — R5383 Other fatigue: Secondary | ICD-10-CM | POA: Diagnosis not present

## 2016-02-12 DIAGNOSIS — D649 Anemia, unspecified: Secondary | ICD-10-CM | POA: Diagnosis not present

## 2016-02-18 DIAGNOSIS — E559 Vitamin D deficiency, unspecified: Secondary | ICD-10-CM | POA: Diagnosis not present

## 2016-02-18 DIAGNOSIS — R5383 Other fatigue: Secondary | ICD-10-CM | POA: Diagnosis not present

## 2016-02-18 DIAGNOSIS — R0789 Other chest pain: Secondary | ICD-10-CM | POA: Diagnosis not present

## 2016-02-18 DIAGNOSIS — R8290 Unspecified abnormal findings in urine: Secondary | ICD-10-CM | POA: Diagnosis not present

## 2016-03-08 DIAGNOSIS — Z961 Presence of intraocular lens: Secondary | ICD-10-CM | POA: Diagnosis not present

## 2016-03-08 DIAGNOSIS — H1789 Other corneal scars and opacities: Secondary | ICD-10-CM | POA: Diagnosis not present

## 2016-03-08 DIAGNOSIS — Z0100 Encounter for examination of eyes and vision without abnormal findings: Secondary | ICD-10-CM | POA: Diagnosis not present

## 2016-04-21 ENCOUNTER — Other Ambulatory Visit: Payer: Self-pay | Admitting: Family

## 2016-04-21 DIAGNOSIS — R42 Dizziness and giddiness: Secondary | ICD-10-CM

## 2016-04-21 DIAGNOSIS — E785 Hyperlipidemia, unspecified: Secondary | ICD-10-CM

## 2016-04-21 DIAGNOSIS — B0229 Other postherpetic nervous system involvement: Secondary | ICD-10-CM | POA: Diagnosis not present

## 2016-04-21 DIAGNOSIS — M542 Cervicalgia: Secondary | ICD-10-CM | POA: Diagnosis not present

## 2016-04-21 DIAGNOSIS — I10 Essential (primary) hypertension: Secondary | ICD-10-CM | POA: Diagnosis not present

## 2016-04-21 DIAGNOSIS — G5601 Carpal tunnel syndrome, right upper limb: Secondary | ICD-10-CM | POA: Diagnosis not present

## 2016-04-21 DIAGNOSIS — R5383 Other fatigue: Secondary | ICD-10-CM | POA: Diagnosis not present

## 2016-04-29 ENCOUNTER — Other Ambulatory Visit: Payer: Commercial Managed Care - HMO

## 2016-05-03 ENCOUNTER — Ambulatory Visit
Admission: RE | Admit: 2016-05-03 | Discharge: 2016-05-03 | Disposition: A | Discharge status: Home / Self Care | Payer: PPO | Source: Ambulatory Visit | Attending: Family | Admitting: Family

## 2016-05-03 DIAGNOSIS — E785 Hyperlipidemia, unspecified: Secondary | ICD-10-CM

## 2016-05-03 DIAGNOSIS — R42 Dizziness and giddiness: Secondary | ICD-10-CM

## 2016-05-03 DIAGNOSIS — I6523 Occlusion and stenosis of bilateral carotid arteries: Secondary | ICD-10-CM | POA: Diagnosis not present

## 2016-05-10 DIAGNOSIS — Z96652 Presence of left artificial knee joint: Secondary | ICD-10-CM | POA: Diagnosis not present

## 2016-05-10 DIAGNOSIS — M25531 Pain in right wrist: Secondary | ICD-10-CM | POA: Diagnosis not present

## 2016-05-10 DIAGNOSIS — M6530 Trigger finger, unspecified finger: Secondary | ICD-10-CM | POA: Diagnosis not present

## 2016-05-26 DIAGNOSIS — M65331 Trigger finger, right middle finger: Secondary | ICD-10-CM | POA: Diagnosis not present

## 2016-05-26 DIAGNOSIS — M67331 Transient synovitis, right wrist: Secondary | ICD-10-CM | POA: Diagnosis not present

## 2016-05-26 DIAGNOSIS — M65332 Trigger finger, left middle finger: Secondary | ICD-10-CM | POA: Diagnosis not present

## 2016-05-26 DIAGNOSIS — M65341 Trigger finger, right ring finger: Secondary | ICD-10-CM | POA: Diagnosis not present

## 2016-06-06 DIAGNOSIS — M67331 Transient synovitis, right wrist: Secondary | ICD-10-CM | POA: Diagnosis not present

## 2016-06-14 DIAGNOSIS — M67331 Transient synovitis, right wrist: Secondary | ICD-10-CM | POA: Diagnosis not present

## 2016-08-15 ENCOUNTER — Encounter: Payer: Self-pay | Admitting: Neurology

## 2016-08-15 ENCOUNTER — Ambulatory Visit (INDEPENDENT_AMBULATORY_CARE_PROVIDER_SITE_OTHER): Payer: PPO | Admitting: Neurology

## 2016-08-15 VITALS — BP 166/72 | HR 71 | Ht 64.0 in | Wt 168.5 lb

## 2016-08-15 DIAGNOSIS — E538 Deficiency of other specified B group vitamins: Secondary | ICD-10-CM

## 2016-08-15 DIAGNOSIS — B0229 Other postherpetic nervous system involvement: Secondary | ICD-10-CM

## 2016-08-15 DIAGNOSIS — G609 Hereditary and idiopathic neuropathy, unspecified: Secondary | ICD-10-CM | POA: Diagnosis not present

## 2016-08-15 DIAGNOSIS — Z5181 Encounter for therapeutic drug level monitoring: Secondary | ICD-10-CM

## 2016-08-15 HISTORY — DX: Other postherpetic nervous system involvement: B02.29

## 2016-08-15 MED ORDER — OXCARBAZEPINE 150 MG PO TABS: 150.0000 mg | ORAL_TABLET | Freq: Two times a day (BID) | ORAL | 3 refills | Status: DC

## 2016-08-15 NOTE — Progress Notes (Signed)
Reason for visit: Right face neuralgia  Referring physician: Dr. [name] is a 82 y.o. female  History of present illness:  [name] is an 82 year old right-handed white female with a history of a shingles outbreak in the right V1 distribution about 5 years ago. Since that time, she has had neuralgia pain in this distribution that has been treated with Trileptal successfully. The patient has been followed in [place], [place], but she desires to transfer care to the [place] area where she lives. The patient had done quite well on the Trileptal, she tried tapering down to 1 tablet daily but the pain returned. The patient is now back on 150 mg twice daily with good improvement. The patient has had episodes of low sodium on the Trileptal, but she is also on hydrochlorothiazide. The patient has also begun to have some numbness and tingly sensations in the feet over the last one year. She will have discomfort when she is resting or when she is trying to sleep at night, she does not notice the dysesthesias while walking. She denies any weakness of the extremities, she has had some mild gait instability and an occasional fall. She does report some low back pain that is worse first thing in the morning but better when she gets up and moves around. She has some occasional diarrhea, but no problems controlling the bladder. She has a history of right carpal tunnel syndrome, status post surgery previously. The patient denies any other types of head pain, she denies any visual field changes or difficulty with swallowing. She is sent to this office for an evaluation.  Past Medical History:  Diagnosis Date  . Arthritis   . Diarrhea    "sometimes ., due to Gallbladder removed"  . High cholesterol   . History of shingles    effecrt nerve sees neurologist in [facility]  . Hypertension   . Shingles     Past Surgical History:  Procedure Laterality Date  . ABDOMINAL HYSTERECTOMY      COMPLETE  . CARPAL TUNNEL RELEASE Bilateral   . CHOLECYSTECTOMY    . EYE SURGERY     Cornea  . GAS INSERTION Right 01/27/2015   Procedure: INSERTION OF GAS;  Surgeon: Hayden Pedro, MD;  Location: [facility];  Service: Ophthalmology;  Laterality: Right;  C3F8  . JOINT REPLACEMENT Right 4 YRS AGO   KNEE  . KNEE ARTHROSCOPY Bilateral   . MEMBRANE PEEL Right 01/27/2015   Procedure: MEMBRANE PEEL;  Surgeon: Hayden Pedro, MD;  Location: [facility];  Service: Ophthalmology;  Laterality: Right;  . PERFLUORONE INJECTION Right 01/27/2015   Procedure: PERFLUORONE INJECTION;  Surgeon: Hayden Pedro, MD;  Location: [place];  Service: Ophthalmology;  Laterality: Right;  . PHOTOCOAGULATION WITH LASER Right 01/27/2015   Procedure: PHOTOCOAGULATION WITH LASER;  Surgeon: Hayden Pedro, MD;  Location: [place];  Service: Ophthalmology;  Laterality: Right;  ENDOLASER  . 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   Procedure: REPAIR OF COMPLEX TRACTION RETINAL DETACHMENT;  Surgeon: Hayden Pedro, MD;  Location: [place];  Service: Ophthalmology;  Laterality: Right;  . TOTAL KNEE ARTHROPLASTY Left 12/17/2015   Procedure: LEFT TOTAL KNEE ARTHROPLASTY;  Surgeon: Susa Day, MD;  Location: [facility];  Service: Orthopedics;  Laterality: Left;  Marland Kitchen VITRECTOMY 25 GAUGE WITH SCLERAL BUCKLE Right 01/27/2015   Procedure: VITRECTOMY 25 GAUGE WITH SCLERAL BUCKLE;  Surgeon: Hayden Pedro, MD;  Location: [place];  Service: Ophthalmology;  Laterality: Right;    History reviewed. No pertinent family history.  Social history:  reports that she has never smoked. She has never used smokeless tobacco. She reports that she does not drink alcohol or use drugs.  Medications:  Prior to Admission medications   Medication Sig Start Date End Date Taking? Authorizing Provider  acetaminophen (TYLENOL) 500 MG tablet Take 500 mg by mouth every 6 (six) hours as needed (Pain).   Yes Historical Provider, MD  aspirin EC 81 MG  tablet Take 81 mg by mouth daily.   Yes Historical Provider, MD  calcium carbonate (OS-CAL) 600 MG TABS Take 600 mg by mouth daily.   Yes Historical Provider, MD  hydrochlorothiazide (HYDRODIURIL) 25 MG tablet Take 25 mg by mouth at bedtime.    Yes Historical Provider, MD  lovastatin (MEVACOR) 40 MG tablet take 1 tablet once a day orally 90 days 07/29/16  Yes Historical Provider, MD  meloxicam (MOBIC) 15 MG tablet Take 15 mg by mouth daily.   Yes Historical Provider, MD  metoprolol tartrate (LOPRESSOR) 25 MG tablet Take 25 mg by mouth 2 (two) times daily. 05/23/16  Yes Historical Provider, MD  Misc Natural Products (TART CHERRY ADVANCED PO) Take by mouth daily.   Yes Historical Provider, MD  OXcarbazepine (TRILEPTAL) 150 MG tablet Take 150 mg by mouth 2 (two) times daily.    Yes Historical Provider, MD  prednisoLONE acetate (PRED FORTE) 1 % ophthalmic suspension Place 1 drop into the right eye 4 (four) times daily. Patient taking differently: Place 1 drop into both eyes daily.  01/28/15  Yes Hayden Pedro, MD  Prenatal Vit-Fe Fumarate-FA (PRENATAL MULTIVITAMIN) TABS Take 1 tablet by mouth every morning.    Yes Historical Provider, MD  valsartan (DIOVAN) 160 MG tablet Take 80 mg by mouth at bedtime.    Yes Historical Provider, MD      Allergies  Allergen Reactions  . Tape     Pulled skin off. Paper tape is ok.   . Codeine Rash    ROS:  Out of a complete 14 system review of symptoms, the patient complains only of the following symptoms, and all other reviewed systems are negative.  Numbness Facial pain  Blood pressure (!) 166/72, pulse 71, height 5\' 4"  (1.626 m), weight 168 lb 8 oz (76.4 kg).  Physical Exam  General: The patient is alert and cooperative at the time of the examination.  Eyes: Pupils are equal, round, and reactive to light. Discs are flat bilaterally.  Neck: The neck is supple, no carotid bruits are noted.  Respiratory: The respiratory examination is  clear.  Cardiovascular: The cardiovascular examination reveals a regular rate and rhythm, no obvious murmurs or rubs are noted.  Skin: Extremities are without significant edema.  Neurologic Exam  Mental status: The patient is alert and oriented x 3 at the time of the examination. The patient has apparent normal recent and remote memory, with an apparently normal attention span and concentration ability.  Cranial nerves: Facial symmetry is present. There is good sensation of the face to pinprick and soft touch on the left, hypersensitivity to pinprick on the right V1 distribution, otherwise symmetric on the lower face. The strength of the facial muscles and the muscles to head turning and shoulder shrug are normal bilaterally. Speech is well enunciated, no aphasia or dysarthria is noted. Extraocular movements are full. Visual fields are full. The tongue is midline, and the patient has symmetric elevation of the soft palate.  No obvious hearing deficits are noted.  Motor: The motor testing reveals 5 over 5 strength of all 4 extremities. Good symmetric motor tone is noted throughout.  Sensory: Sensory testing is intact to pinprick, soft touch, vibration sensation, and position sense on the upper extremities. The patient does not have a clear stocking pattern pinprick sensory deficit, position sensation is mildly decreased in both feet. Vibration sensation is symmetric in the feet. No evidence of extinction is noted.  Coordination: Cerebellar testing reveals good finger-nose-finger and heel-to-shin bilaterally.  Gait and station: Gait is normal. Tandem gait is slightly unsteady. Romberg is negative. No drift is seen.  Reflexes: Deep tendon reflexes are symmetric, but is slightly depressed bilaterally. Toes are downgoing bilaterally.   Assessment/Plan:  1. Right V1 distribution postherpetic neuralgia  2. Numbness of the feet, probable peripheral neuropathy  The patient has done well on the  Trileptal, she has had some hyponatremia on this medication, but she is also on hydrochlorothiazide. The patient will be sent for blood work today for this reason and to evaluate her for a possible peripheral neuropathy. The patient will be set up for nerve conduction studies on both legs and one arm. EMG will be done on one leg. She will follow-up in 6 months, but she will also follow-up with EMG evaluation.  Jill Alexanders MD 08/15/2016 11:57 AM  [facility] [address] [place], [place] [address]  Phone [phone] Fax [phone]

## 2016-08-15 NOTE — Patient Instructions (Signed)
   We will check blood work today and get EMG and NCV study to look at the nerve function in the legs.

## 2016-08-19 LAB — CBC WITH DIFFERENTIAL/PLATELET
BASOS ABS: 0 10*3/uL (ref 0.0–0.2)
Basos: 0 %
EOS (ABSOLUTE): 0.1 10*3/uL (ref 0.0–0.4)
Eos: 1 %
HEMOGLOBIN: 13.5 g/dL (ref 11.1–15.9)
Hematocrit: 39.6 % (ref 34.0–46.6)
Immature Grans (Abs): 0 10*3/uL (ref 0.0–0.1)
Immature Granulocytes: 0 %
LYMPHS ABS: 1.4 10*3/uL (ref 0.7–3.1)
Lymphs: 20 %
MCH: 32.1 pg (ref 26.6–33.0)
MCHC: 34.1 g/dL (ref 31.5–35.7)
MCV: 94 fL (ref 79–97)
MONOCYTES: 11 %
MONOS ABS: 0.7 10*3/uL (ref 0.1–0.9)
Neutrophils Absolute: 4.9 10*3/uL (ref 1.4–7.0)
Neutrophils: 68 %
PLATELETS: 309 10*3/uL (ref 150–379)
RBC: 4.21 x10E6/uL (ref 3.77–5.28)
RDW: 13.6 % (ref 12.3–15.4)
WBC: 7.1 10*3/uL (ref 3.4–10.8)

## 2016-08-19 LAB — COMPREHENSIVE METABOLIC PANEL
ALBUMIN: 4.2 g/dL (ref 3.5–4.7)
ALK PHOS: 73 IU/L (ref 39–117)
ALT: 13 IU/L (ref 0–32)
AST: 17 IU/L (ref 0–40)
Albumin/Globulin Ratio: 1.8 (ref 1.2–2.2)
BILIRUBIN TOTAL: 0.8 mg/dL (ref 0.0–1.2)
BUN / CREAT RATIO: 14 (ref 12–28)
BUN: 7 mg/dL — AB (ref 8–27)
CHLORIDE: 86 mmol/L — AB (ref 96–106)
CO2: 30 mmol/L — ABNORMAL HIGH (ref 18–29)
Calcium: 9.8 mg/dL (ref 8.7–10.3)
Creatinine, Ser: 0.49 mg/dL — ABNORMAL LOW (ref 0.57–1.00)
GFR calc Af Amer: 106 mL/min/{1.73_m2} (ref >59)
GFR calc non Af Amer: 92 mL/min/{1.73_m2} (ref >59)
GLUCOSE: 94 mg/dL (ref 65–99)
Globulin, Total: 2.4 g/dL (ref 1.5–4.5)
Potassium: 4.9 mmol/L (ref 3.5–5.2)
Sodium: 130 mmol/L — ABNORMAL LOW (ref 134–144)
Total Protein: 6.6 g/dL (ref 6.0–8.5)

## 2016-08-19 LAB — MULTIPLE MYELOMA PANEL, SERUM
ALBUMIN SERPL ELPH-MCNC: 3.4 g/dL (ref 2.9–4.4)
ALBUMIN/GLOB SERPL: 1.1 (ref 0.7–1.7)
Alpha 1: 0.3 g/dL (ref 0.0–0.4)
Alpha2 Glob SerPl Elph-Mcnc: 1 g/dL (ref 0.4–1.0)
B-GLOBULIN SERPL ELPH-MCNC: 1.2 g/dL (ref 0.7–1.3)
GAMMA GLOB SERPL ELPH-MCNC: 0.6 g/dL (ref 0.4–1.8)
GLOBULIN, TOTAL: 3.2 g/dL (ref 2.2–3.9)
IGG (IMMUNOGLOBIN G), SERUM: 562 mg/dL — AB (ref 700–1600)
IgA/Immunoglobulin A, Serum: 276 mg/dL (ref 64–422)
IgM (Immunoglobulin M), Srm: 16 mg/dL — ABNORMAL LOW (ref 26–217)

## 2016-08-19 LAB — VITAMIN B12: VITAMIN B 12: 529 pg/mL (ref 232–1245)

## 2016-08-19 LAB — SEDIMENTATION RATE: Sed Rate: 12 mm/hr (ref 0–40)

## 2016-08-19 LAB — ANGIOTENSIN CONVERTING ENZYME: Angio Convert Enzyme: 44 U/L (ref 14–82)

## 2016-08-19 LAB — ANA W/REFLEX: Anti Nuclear Antibody(ANA): NEGATIVE

## 2016-08-19 LAB — B. BURGDORFI ANTIBODIES

## 2016-08-21 ENCOUNTER — Telehealth: Payer: Self-pay | Admitting: Neurology

## 2016-08-21 NOTE — Telephone Encounter (Signed)
I called the patient. The blood work showed a low sodium level. This may be due to the use of Trileptal and/or HCTZ. She is to reduce the fluid intake, we will follow. Hgb is 11.8, mild anemia.  EMG and NCV study is pending.

## 2016-08-23 DIAGNOSIS — M67331 Transient synovitis, right wrist: Secondary | ICD-10-CM | POA: Diagnosis not present

## 2016-10-03 ENCOUNTER — Ambulatory Visit (INDEPENDENT_AMBULATORY_CARE_PROVIDER_SITE_OTHER): Payer: Self-pay | Admitting: Neurology

## 2016-10-03 ENCOUNTER — Ambulatory Visit (INDEPENDENT_AMBULATORY_CARE_PROVIDER_SITE_OTHER): Payer: PPO | Admitting: Neurology

## 2016-10-03 ENCOUNTER — Encounter: Payer: Self-pay | Admitting: Neurology

## 2016-10-03 DIAGNOSIS — G609 Hereditary and idiopathic neuropathy, unspecified: Secondary | ICD-10-CM | POA: Diagnosis not present

## 2016-10-03 DIAGNOSIS — Z5181 Encounter for therapeutic drug level monitoring: Secondary | ICD-10-CM

## 2016-10-03 DIAGNOSIS — B0229 Other postherpetic nervous system involvement: Secondary | ICD-10-CM

## 2016-10-03 MED ORDER — DULOXETINE HCL 30 MG PO CPEP: 30.0000 mg | ORAL_CAPSULE | Freq: Every day | ORAL | 3 refills | Status: DC

## 2016-10-03 NOTE — Progress Notes (Signed)
Please refer to EMG and nerve conduction study procedure note. 

## 2016-10-03 NOTE — Procedures (Signed)
     HISTORY:  Terri Liu is an 83 year old patient with a history of seizures in the feet that is worse in the evening hours. The patient reports some numbness in the right hand. She is being evaluated for a possible peripheral neuropathy. She also reports chronic low back pain.  NERVE CONDUCTION STUDIES:  Nerve conduction studies were performed on the right upper extremity. The distal motor latency for the right median nerve was prolonged, with a normal motor amplitude. The distal motor latency and motor amplitudes for the right ulnar nerve were normal. The F wave latencies and nerve conduction velocities for these nerves were normal. The sensory latency for the right median nerve was prolonged, normal for the right ulnar nerve.  Nerve conduction studies were performed on both lower extremities. The distal motor latencies for the peroneal and posterior tibial nerves were normal bilaterally with low motor amplitudes seen for the left peroneal nerve and for the right posterior tibial nerve. The nerve conduction velocities for these nerves were normal bilaterally. The H reflex latencies were symmetric and normal and the peroneal sensory latencies were symmetric and normal.  EMG STUDIES:  EMG study was performed on the right lower extremity:  The tibialis anterior muscle reveals 2 to 4K motor units with full recruitment. No fibrillations or positive waves were seen. The peroneus tertius muscle reveals 2 to 4K motor units with full recruitment. No fibrillations or positive waves were seen. The medial gastrocnemius muscle reveals 1 to 3K motor units with full recruitment. No fibrillations or positive waves were seen. The vastus lateralis muscle reveals 2 to 4K motor units with full recruitment. No fibrillations or positive waves were seen. The iliopsoas muscle reveals 2 to 4K motor units with full recruitment. No fibrillations or positive waves were seen. The biceps femoris muscle (long head)  reveals 2 to 4K motor units with full recruitment. No fibrillations or positive waves were seen. The lumbosacral paraspinal muscles were tested at 3 levels, and revealed no abnormalities of insertional activity at all 3 levels tested. There was good relaxation.   IMPRESSION:  Nerve conduction studies done on the right upper extremity and both lower extremities does not show clear evidence of a peripheral neuropathy, but the patient could have an early peripheral neuropathy or a small fiber neuropathy. Clinical correlation is required. There is evidence of an overlying mild right carpal tunnel syndrome. EMG evaluation of the right lower extremity was unremarkable without evidence of an overlying lumbosacral radiculopathy.  Jill Alexanders MD 10/03/2016 1:44 PM  [facility] [address] [place], [place] [address]  Phone [phone] Fax [phone]

## 2016-10-03 NOTE — Progress Notes (Signed)
The patient comes in for EMG and nerve conduction study today. She continues to complain of paresthesias in the feet that is worse in the evening hours. She has numbness of the right hand.  Nerve conduction studies do not show a definite peripheral neuropathy, the patient may have an early neuropathy or a small fiber neuropathy. Does have mild right carpal tunnel syndrome.  She has been seen previously through [facility], she may go back there for an evaluation for carpal tunnel syndrome. We will try low-dose Cymbalta for the neuropathy discomfort. We will recheck blood work today looking at the sodium level on the Trileptal.

## 2016-10-04 ENCOUNTER — Telehealth: Payer: Self-pay | Admitting: Neurology

## 2016-10-04 LAB — BASIC METABOLIC PANEL WITH GFR
BUN/Creatinine Ratio: 19 (ref 12–28)
BUN: 9 mg/dL (ref 8–27)
CO2: 29 mmol/L (ref 18–29)
Calcium: 9.8 mg/dL (ref 8.7–10.3)
Chloride: 88 mmol/L — ABNORMAL LOW (ref 96–106)
Creatinine, Ser: 0.48 mg/dL — ABNORMAL LOW (ref 0.57–1.00)
GFR calc Af Amer: 107 mL/min/{1.73_m2}
GFR calc non Af Amer: 93 mL/min/{1.73_m2}
Glucose: 94 mg/dL (ref 65–99)
Potassium: 5.3 mmol/L — ABNORMAL HIGH (ref 3.5–5.2)
Sodium: 131 mmol/L — ABNORMAL LOW (ref 134–144)

## 2016-10-04 NOTE — Telephone Encounter (Signed)
I called patient. The blood work shows a slightly low and stable sodium level of 131. The potassium is slightly elevated, may be related to the shearing of red blood cells during the blood draw. I discussed this issue with the patient. No change in medication dosing.

## 2016-12-27 DIAGNOSIS — L989 Disorder of the skin and subcutaneous tissue, unspecified: Secondary | ICD-10-CM | POA: Diagnosis not present

## 2016-12-27 DIAGNOSIS — E7800 Pure hypercholesterolemia, unspecified: Secondary | ICD-10-CM | POA: Diagnosis not present

## 2016-12-27 DIAGNOSIS — I10 Essential (primary) hypertension: Secondary | ICD-10-CM | POA: Diagnosis not present

## 2017-01-11 ENCOUNTER — Encounter (HOSPITAL_COMMUNITY): Payer: Self-pay

## 2017-01-11 ENCOUNTER — Emergency Department (HOSPITAL_COMMUNITY): Payer: PPO

## 2017-01-11 ENCOUNTER — Inpatient Hospital Stay (HOSPITAL_COMMUNITY)
Admission: EM | Admit: 2017-01-11 | Discharge: 2017-01-14 | DRG: 481 | Disposition: A | Discharge status: Skilled Nursing Facility | Payer: PPO | Attending: Internal Medicine | Admitting: Internal Medicine

## 2017-01-11 DIAGNOSIS — S8002XA Contusion of left knee, initial encounter: Secondary | ICD-10-CM | POA: Diagnosis present

## 2017-01-11 DIAGNOSIS — M25512 Pain in left shoulder: Secondary | ICD-10-CM | POA: Diagnosis present

## 2017-01-11 DIAGNOSIS — W010XXA Fall on same level from slipping, tripping and stumbling without subsequent striking against object, initial encounter: Secondary | ICD-10-CM | POA: Diagnosis not present

## 2017-01-11 DIAGNOSIS — S72142A Displaced intertrochanteric fracture of left femur, initial encounter for closed fracture: Secondary | ICD-10-CM | POA: Diagnosis not present

## 2017-01-11 DIAGNOSIS — S72012S Unspecified intracapsular fracture of left femur, sequela: Secondary | ICD-10-CM | POA: Diagnosis not present

## 2017-01-11 DIAGNOSIS — W19XXXA Unspecified fall, initial encounter: Secondary | ICD-10-CM | POA: Diagnosis not present

## 2017-01-11 DIAGNOSIS — N319 Neuromuscular dysfunction of bladder, unspecified: Secondary | ICD-10-CM | POA: Diagnosis present

## 2017-01-11 DIAGNOSIS — Z79899 Other long term (current) drug therapy: Secondary | ICD-10-CM

## 2017-01-11 DIAGNOSIS — D72829 Elevated white blood cell count, unspecified: Secondary | ICD-10-CM | POA: Diagnosis not present

## 2017-01-11 DIAGNOSIS — G609 Hereditary and idiopathic neuropathy, unspecified: Secondary | ICD-10-CM | POA: Diagnosis present

## 2017-01-11 DIAGNOSIS — M1712 Unilateral primary osteoarthritis, left knee: Secondary | ICD-10-CM | POA: Diagnosis present

## 2017-01-11 DIAGNOSIS — Z90710 Acquired absence of both cervix and uterus: Secondary | ICD-10-CM | POA: Diagnosis not present

## 2017-01-11 DIAGNOSIS — Z96651 Presence of right artificial knee joint: Secondary | ICD-10-CM | POA: Diagnosis present

## 2017-01-11 DIAGNOSIS — Z8249 Family history of ischemic heart disease and other diseases of the circulatory system: Secondary | ICD-10-CM

## 2017-01-11 DIAGNOSIS — E669 Obesity, unspecified: Secondary | ICD-10-CM | POA: Diagnosis present

## 2017-01-11 DIAGNOSIS — E860 Dehydration: Secondary | ICD-10-CM | POA: Diagnosis not present

## 2017-01-11 DIAGNOSIS — Z885 Allergy status to narcotic agent status: Secondary | ICD-10-CM

## 2017-01-11 DIAGNOSIS — Z9049 Acquired absence of other specified parts of digestive tract: Secondary | ICD-10-CM

## 2017-01-11 DIAGNOSIS — I10 Essential (primary) hypertension: Secondary | ICD-10-CM

## 2017-01-11 DIAGNOSIS — F339 Major depressive disorder, recurrent, unspecified: Secondary | ICD-10-CM | POA: Diagnosis present

## 2017-01-11 DIAGNOSIS — Z01811 Encounter for preprocedural respiratory examination: Secondary | ICD-10-CM

## 2017-01-11 DIAGNOSIS — Z419 Encounter for procedure for purposes other than remedying health state, unspecified: Secondary | ICD-10-CM

## 2017-01-11 DIAGNOSIS — Z96652 Presence of left artificial knee joint: Secondary | ICD-10-CM | POA: Diagnosis present

## 2017-01-11 DIAGNOSIS — Z4789 Encounter for other orthopedic aftercare: Secondary | ICD-10-CM | POA: Diagnosis not present

## 2017-01-11 DIAGNOSIS — R2681 Unsteadiness on feet: Secondary | ICD-10-CM | POA: Diagnosis not present

## 2017-01-11 DIAGNOSIS — Z91048 Other nonmedicinal substance allergy status: Secondary | ICD-10-CM

## 2017-01-11 DIAGNOSIS — S79911A Unspecified injury of right hip, initial encounter: Secondary | ICD-10-CM | POA: Diagnosis not present

## 2017-01-11 DIAGNOSIS — E785 Hyperlipidemia, unspecified: Secondary | ICD-10-CM | POA: Diagnosis not present

## 2017-01-11 DIAGNOSIS — S8992XA Unspecified injury of left lower leg, initial encounter: Secondary | ICD-10-CM | POA: Diagnosis not present

## 2017-01-11 DIAGNOSIS — S7292XD Unspecified fracture of left femur, subsequent encounter for closed fracture with routine healing: Secondary | ICD-10-CM | POA: Diagnosis not present

## 2017-01-11 DIAGNOSIS — T148XXA Other injury of unspecified body region, initial encounter: Secondary | ICD-10-CM | POA: Diagnosis not present

## 2017-01-11 DIAGNOSIS — Z09 Encounter for follow-up examination after completed treatment for conditions other than malignant neoplasm: Secondary | ICD-10-CM

## 2017-01-11 DIAGNOSIS — E871 Hypo-osmolality and hyponatremia: Secondary | ICD-10-CM | POA: Diagnosis not present

## 2017-01-11 DIAGNOSIS — M6281 Muscle weakness (generalized): Secondary | ICD-10-CM | POA: Diagnosis not present

## 2017-01-11 DIAGNOSIS — S4992XA Unspecified injury of left shoulder and upper arm, initial encounter: Secondary | ICD-10-CM | POA: Diagnosis not present

## 2017-01-11 DIAGNOSIS — R1312 Dysphagia, oropharyngeal phase: Secondary | ICD-10-CM | POA: Diagnosis not present

## 2017-01-11 DIAGNOSIS — Z972 Presence of dental prosthetic device (complete) (partial): Secondary | ICD-10-CM | POA: Diagnosis not present

## 2017-01-11 DIAGNOSIS — Z01818 Encounter for other preprocedural examination: Secondary | ICD-10-CM

## 2017-01-11 DIAGNOSIS — M5432 Sciatica, left side: Secondary | ICD-10-CM | POA: Diagnosis present

## 2017-01-11 DIAGNOSIS — E878 Other disorders of electrolyte and fluid balance, not elsewhere classified: Secondary | ICD-10-CM | POA: Diagnosis present

## 2017-01-11 DIAGNOSIS — Z7982 Long term (current) use of aspirin: Secondary | ICD-10-CM

## 2017-01-11 DIAGNOSIS — R52 Pain, unspecified: Secondary | ICD-10-CM | POA: Diagnosis not present

## 2017-01-11 DIAGNOSIS — M79605 Pain in left leg: Secondary | ICD-10-CM | POA: Diagnosis not present

## 2017-01-11 DIAGNOSIS — F329 Major depressive disorder, single episode, unspecified: Secondary | ICD-10-CM | POA: Diagnosis not present

## 2017-01-11 DIAGNOSIS — S728X9A Other fracture of unspecified femur, initial encounter for closed fracture: Secondary | ICD-10-CM | POA: Diagnosis not present

## 2017-01-11 DIAGNOSIS — Z6831 Body mass index (BMI) 31.0-31.9, adult: Secondary | ICD-10-CM | POA: Diagnosis not present

## 2017-01-11 DIAGNOSIS — S72002A Fracture of unspecified part of neck of left femur, initial encounter for closed fracture: Secondary | ICD-10-CM | POA: Diagnosis not present

## 2017-01-11 DIAGNOSIS — S72102S Unspecified trochanteric fracture of left femur, sequela: Secondary | ICD-10-CM | POA: Diagnosis not present

## 2017-01-11 DIAGNOSIS — M25562 Pain in left knee: Secondary | ICD-10-CM | POA: Diagnosis not present

## 2017-01-11 LAB — TYPE AND SCREEN
ABO/RH(D): A POS
Antibody Screen: NEGATIVE

## 2017-01-11 LAB — CBC WITH DIFFERENTIAL/PLATELET
Basophils Absolute: 0 10*3/uL (ref 0.0–0.1)
Basophils Relative: 0 %
EOS PCT: 0 %
Eosinophils Absolute: 0 10*3/uL (ref 0.0–0.7)
HCT: 37 % (ref 36.0–46.0)
Hemoglobin: 13.7 g/dL (ref 12.0–15.0)
LYMPHS PCT: 7 %
Lymphs Abs: 1.2 10*3/uL (ref 0.7–4.0)
MCH: 33.4 pg (ref 26.0–34.0)
MCHC: 37 g/dL — AB (ref 30.0–36.0)
MCV: 90.2 fL (ref 78.0–100.0)
MONO ABS: 0.9 10*3/uL (ref 0.1–1.0)
MONOS PCT: 6 %
Neutro Abs: 13.7 10*3/uL — ABNORMAL HIGH (ref 1.7–7.7)
Neutrophils Relative %: 87 %
PLATELETS: 215 10*3/uL (ref 150–400)
RBC: 4.1 MIL/uL (ref 3.87–5.11)
RDW: 12.1 % (ref 11.5–15.5)
WBC: 15.8 10*3/uL — ABNORMAL HIGH (ref 4.0–10.5)

## 2017-01-11 LAB — BASIC METABOLIC PANEL
Anion gap: 8 (ref 5–15)
BUN: 12 mg/dL (ref 6–20)
CALCIUM: 8.9 mg/dL (ref 8.9–10.3)
CO2: 27 mmol/L (ref 22–32)
Chloride: 91 mmol/L — ABNORMAL LOW (ref 101–111)
Creatinine, Ser: 0.5 mg/dL (ref 0.44–1.00)
GFR calc Af Amer: >60 mL/min (ref >60)
GFR calc non Af Amer: >60 mL/min (ref >60)
GLUCOSE: 120 mg/dL — AB (ref 65–99)
Potassium: 3.8 mmol/L (ref 3.5–5.1)
Sodium: 126 mmol/L — ABNORMAL LOW (ref 135–145)

## 2017-01-11 LAB — PROTIME-INR
INR: 0.97
Prothrombin Time: 12.9 seconds (ref 11.4–15.2)

## 2017-01-11 MED ORDER — IRBESARTAN 150 MG PO TABS
150.0000 mg | ORAL_TABLET | Freq: Every day | ORAL | Status: DC
  Administered 2017-01-13 – 2017-01-14 (×2): 150 mg via ORAL
  Filled 2017-01-11 (×2): qty 1

## 2017-01-11 MED ORDER — CYCLOSPORINE 0.05 % OP EMUL
1.0000 [drp] | Freq: Two times a day (BID) | OPHTHALMIC | Status: DC
  Administered 2017-01-13 – 2017-01-14 (×3): 1 [drp] via OPHTHALMIC
  Filled 2017-01-11 (×6): qty 1

## 2017-01-11 MED ORDER — PRENATAL MULTIVITAMIN CH
1.0000 | ORAL_TABLET | Freq: Every morning | ORAL | Status: DC
  Administered 2017-01-13 – 2017-01-14 (×2): 1 via ORAL
  Filled 2017-01-11 (×3): qty 1

## 2017-01-11 MED ORDER — MORPHINE SULFATE (PF) 4 MG/ML IV SOLN
2.0000 mg | INTRAVENOUS | Status: DC | PRN
Start: 2017-01-11 — End: 2017-01-12
  Administered 2017-01-11 – 2017-01-12 (×12): 2 mg via INTRAVENOUS
  Filled 2017-01-11 (×12): qty 1

## 2017-01-11 MED ORDER — OXCARBAZEPINE 150 MG PO TABS
150.0000 mg | ORAL_TABLET | Freq: Two times a day (BID) | ORAL | Status: DC
  Administered 2017-01-12 – 2017-01-14 (×5): 150 mg via ORAL
  Filled 2017-01-11 (×6): qty 1

## 2017-01-11 MED ORDER — CALCIUM CARBONATE 1250 (500 CA) MG PO TABS
1250.0000 mg | ORAL_TABLET | Freq: Every day | ORAL | Status: DC
  Administered 2017-01-13 – 2017-01-14 (×2): 1250 mg via ORAL
  Filled 2017-01-11 (×2): qty 1

## 2017-01-11 MED ORDER — CEFAZOLIN SODIUM-DEXTROSE 2-4 GM/100ML-% IV SOLN
2.0000 g | INTRAVENOUS | Status: AC
  Administered 2017-01-12: 2 g via INTRAVENOUS

## 2017-01-11 MED ORDER — MORPHINE SULFATE (PF) 4 MG/ML IV SOLN
0.5000 mg | INTRAVENOUS | Status: DC | PRN
  Administered 2017-01-13 (×4): 0.52 mg via INTRAVENOUS
  Filled 2017-01-11 (×4): qty 1

## 2017-01-11 MED ORDER — METOPROLOL TARTRATE 25 MG PO TABS
25.0000 mg | ORAL_TABLET | Freq: Two times a day (BID) | ORAL | Status: DC
  Administered 2017-01-12 – 2017-01-14 (×4): 25 mg via ORAL
  Filled 2017-01-11 (×4): qty 1

## 2017-01-11 MED ORDER — MORPHINE SULFATE (PF) 4 MG/ML IV SOLN
4.0000 mg | Freq: Once | INTRAVENOUS | Status: AC
  Administered 2017-01-11: 4 mg via INTRAVENOUS
  Filled 2017-01-11: qty 1

## 2017-01-11 MED ORDER — HYDROCODONE-ACETAMINOPHEN 5-325 MG PO TABS
1.0000 | ORAL_TABLET | Freq: Four times a day (QID) | ORAL | Status: DC | PRN
  Administered 2017-01-12: 1 via ORAL
  Administered 2017-01-13 – 2017-01-14 (×4): 2 via ORAL
  Filled 2017-01-11 (×2): qty 2
  Filled 2017-01-11: qty 1
  Filled 2017-01-11 (×2): qty 2

## 2017-01-11 MED ORDER — POVIDONE-IODINE 10 % EX SWAB: 2.0000 "application " | Freq: Once | CUTANEOUS | Status: DC

## 2017-01-11 MED ORDER — ASPIRIN EC 325 MG PO TBEC: 325.0000 mg | DELAYED_RELEASE_TABLET | Freq: Every day | ORAL | Status: DC

## 2017-01-11 MED ORDER — HYDROCHLOROTHIAZIDE 25 MG PO TABS
25.0000 mg | ORAL_TABLET | Freq: Every day | ORAL | Status: DC
Start: 2017-01-11 — End: 2017-01-13
  Filled 2017-01-11: qty 1

## 2017-01-11 MED ORDER — PRAVASTATIN SODIUM 20 MG PO TABS
40.0000 mg | ORAL_TABLET | Freq: Every day | ORAL | Status: DC
  Administered 2017-01-12 – 2017-01-13 (×2): 40 mg via ORAL
  Filled 2017-01-11 (×2): qty 1
  Filled 2017-01-11 (×2): qty 2

## 2017-01-11 MED ORDER — VITAMIN B-12 1000 MCG PO TABS
500.0000 ug | ORAL_TABLET | Freq: Two times a day (BID) | ORAL | Status: DC
  Administered 2017-01-13 – 2017-01-14 (×3): 500 ug via ORAL
  Filled 2017-01-11 (×6): qty 1

## 2017-01-11 MED ORDER — CHLORHEXIDINE GLUCONATE 4 % EX LIQD
60.0000 mL | Freq: Once | CUTANEOUS | Status: DC
  Filled 2017-01-11: qty 15

## 2017-01-11 NOTE — H&P (Signed)
History and Physical    Terri Liu [id number] DOB: [birth year] DOA: 01/11/2017  PCP: Antony Blackbird, MD  Patient coming from: Home  I have personally briefly reviewed patient's old medical records in [place]  Chief Complaint: L hip pain  HPI: Terri Liu is a 83 y.o. female with medical history significant of HTN.  Patient presents to the ED following mechanical fall earlier today.  L hip pain and inability to weight bear.  Pain worse with movement, better at rest.  Pain severe.   ED Course: L hip intertroch fx.   Review of Systems: As per HPI otherwise 10 point review of systems negative.   Past Medical History:  Diagnosis Date  . Arthritis   . Diarrhea    "sometimes ., due to Gallbladder removed"  . High cholesterol   . History of shingles    effecrt nerve sees neurologist in [facility]  . Hypertension   . Post herpetic neuralgia 08/15/2016   Right V1  . Shingles     Past Surgical History:  Procedure Laterality Date  . ABDOMINAL HYSTERECTOMY     COMPLETE  . CARPAL TUNNEL RELEASE Bilateral   . CHOLECYSTECTOMY    . EYE SURGERY     Cornea  . GAS INSERTION Right 01/27/2015   Procedure: INSERTION OF GAS;  Surgeon: Hayden Pedro, MD;  Location: [facility];  Service: Ophthalmology;  Laterality: Right;  C3F8  . JOINT REPLACEMENT Right 4 YRS AGO   KNEE  . KNEE ARTHROSCOPY Bilateral   . MEMBRANE PEEL Right 01/27/2015   Procedure: MEMBRANE PEEL;  Surgeon: Hayden Pedro, MD;  Location: [facility];  Service: Ophthalmology;  Laterality: Right;  . PERFLUORONE INJECTION Right 01/27/2015   Procedure: PERFLUORONE INJECTION;  Surgeon: Hayden Pedro, MD;  Location: [facility];  Service: Ophthalmology;  Laterality: Right;  . PHOTOCOAGULATION WITH LASER Right 01/27/2015   Procedure: PHOTOCOAGULATION WITH LASER;  Surgeon: Hayden Pedro, MD;  Location: [facility];  Service: Ophthalmology;  Laterality: Right;  ENDOLASER  . 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 TOTAL KNEE ARTHROPLASTY Left 12/17/2015   Procedure: LEFT TOTAL KNEE ARTHROPLASTY;  Surgeon: Susa Day, MD;  Location: [facility];  Service: Orthopedics;  Laterality: Left;  Marland Kitchen VITRECTOMY 25 GAUGE WITH SCLERAL BUCKLE Right 01/27/2015   Procedure: VITRECTOMY 25 GAUGE WITH SCLERAL BUCKLE;  Surgeon: Hayden Pedro, MD;  Location: [facility];  Service: Ophthalmology;  Laterality: Right;     reports that she has never smoked. She has never used smokeless tobacco. She reports that she does not drink alcohol or use drugs.  Allergies  Allergen Reactions  . Tape     Pulled skin off. Paper tape is ok.   . Codeine Rash    Family History  Problem Relation Age of Onset  . Heart failure Mother   . Cancer - Prostate Father   . Heart disease Brother   . Cancer Sister   . Cancer Brother      Prior to Admission medications   Medication Sig Start Date End Date Taking? Authorizing Provider  aspirin EC 81 MG tablet Take 81 mg by mouth daily.   Yes Historical Provider, MD  calcium carbonate (OS-CAL) 600 MG TABS Take 600 mg by mouth daily.   Yes Historical Provider, MD  cyanocobalamin 500 MCG  tablet Take 500 mcg by mouth 2 (two) times daily.   Yes Historical Provider, MD  cycloSPORINE (RESTASIS) 0.05 % ophthalmic emulsion Place 1 drop into both eyes 2 (two) times daily.   Yes Historical Provider, MD  hydrochlorothiazide (HYDRODIURIL) 25 MG tablet Take 25 mg by mouth at bedtime.    Yes Historical Provider, MD  lovastatin (MEVACOR) 40 MG tablet take 1 tablet once a day orally 90 days 07/29/16  Yes Historical Provider, MD  metoprolol tartrate (LOPRESSOR) 25 MG tablet Take 25 mg by mouth 2 (two) times daily. 05/23/16  Yes Historical Provider, MD  Misc Natural Products (TART CHERRY ADVANCED PO) Take by mouth daily.   Yes Historical Provider, MD  OXcarbazepine  (TRILEPTAL) 150 MG tablet Take 1 tablet (150 mg total) by mouth 2 (two) times daily. 08/15/16  Yes Kathrynn Ducking, MD  Prenatal Vit-Fe Fumarate-FA (PRENATAL MULTIVITAMIN) TABS Take 1 tablet by mouth every morning.    Yes Historical Provider, MD  valsartan (DIOVAN) 160 MG tablet Take 160 mg by mouth at bedtime.    Yes Historical Provider, MD    Physical Exam: Vitals:   01/11/17 1846 01/11/17 2030 01/11/17 2100 01/11/17 2200  BP: (!) 161/74 (!) 152/65 (!) 171/92 (!) 143/78  Pulse: 90 81 98 90  Resp: 18 20 18  (!) 21  Temp: 98.1 F (36.7 C)     TempSrc: Oral     SpO2: 96% 95% 93% 93%    Constitutional: NAD, calm, comfortable Eyes: PERRL, lids and conjunctivae normal ENMT: Mucous membranes are moist. Posterior pharynx clear of any exudate or lesions.Normal dentition.  Neck: normal, supple, no masses, no thyromegaly Respiratory: clear to auscultation bilaterally, no wheezing, no crackles. Normal respiratory effort. No accessory muscle use.  Cardiovascular: Regular rate and rhythm, no murmurs / rubs / gallops. No extremity edema. 2+ pedal pulses. No carotid bruits.  Abdomen: no tenderness, no masses palpated. No hepatosplenomegaly. Bowel sounds positive.  Musculoskeletal: no clubbing / cyanosis. No joint deformity upper and lower extremities. Good ROM, no contractures. Normal muscle tone.  Skin: no rashes, lesions, ulcers. No induration Neurologic: CN 2-12 grossly intact. Sensation intact, DTR normal. Strength 5/5 in all 4.  Psychiatric: Normal judgment and insight. Alert and oriented x 3. Normal mood.    Labs on Admission: I have personally reviewed following labs and imaging studies  CBC:  Recent Labs Lab 01/11/17 2104  WBC 15.8*  NEUTROABS 13.7*  HGB 13.7  HCT 37.0  MCV 90.2  PLT 382   Basic Metabolic Panel:  Recent Labs Lab 01/11/17 2104  NA 126*  K 3.8  CL 91*  CO2 27  GLUCOSE 120*  BUN 12  CREATININE 0.50  CALCIUM 8.9   GFR: CrCl cannot be calculated  (Unknown ideal weight.). Liver Function Tests: No results for input(s): AST, ALT, ALKPHOS, BILITOT, PROT, ALBUMIN in the last 168 hours. No results for input(s): LIPASE, AMYLASE in the last 168 hours. No results for input(s): AMMONIA in the last 168 hours. Coagulation Profile:  Recent Labs Lab 01/11/17 2104  INR 0.97   Cardiac Enzymes: No results for input(s): CKTOTAL, CKMB, CKMBINDEX, TROPONINI in the last 168 hours. BNP (last 3 results) No results for input(s): PROBNP in the last 8760 hours. HbA1C: No results for input(s): HGBA1C in the last 72 hours. CBG: No results for input(s): GLUCAP in the last 168 hours. Lipid Profile: No results for input(s): CHOL, HDL, LDLCALC, TRIG, CHOLHDL, LDLDIRECT in the last 72 hours. Thyroid Function Tests: No results for input(s):  TSH, T4TOTAL, FREET4, T3FREE, THYROIDAB in the last 72 hours. Anemia Panel: No results for input(s): VITAMINB12, FOLATE, FERRITIN, TIBC, IRON, RETICCTPCT in the last 72 hours. Urine analysis:    Component Value Date/Time   COLORURINE YELLOW 12/09/2015 1145   APPEARANCEUR CLEAR 12/09/2015 1145   LABSPEC 1.008 12/09/2015 1145   PHURINE 7.5 12/09/2015 1145   GLUCOSEU NEGATIVE 12/09/2015 1145   HGBUR NEGATIVE 12/09/2015 1145   BILIRUBINUR NEGATIVE 12/09/2015 1145   KETONESUR NEGATIVE 12/09/2015 1145   PROTEINUR NEGATIVE 12/09/2015 1145   UROBILINOGEN 0.2 05/26/2008 1130   NITRITE NEGATIVE 12/09/2015 1145   LEUKOCYTESUR SMALL (A) 12/09/2015 1145    Radiological Exams on Admission: Dg Knee 2 Views Left  Result Date: 01/11/2017 CLINICAL DATA:  Recent fall with left knee pain, initial encounter EXAM: LEFT KNEE - 1-2 VIEW COMPARISON:  12/17/2015 FINDINGS: Left knee prosthesis is noted. No acute bony abnormality is seen. Calcified loose body is noted posteriorly. IMPRESSION: No acute abnormality noted. Electronically Signed   By: Inez Catalina M.D.   On: 01/11/2017 20:14   Dg Chest Port 1 View  Result Date:  01/11/2017 CLINICAL DATA:  Preoperative evaluation for left hip fracture EXAM: PORTABLE CHEST 1 VIEW COMPARISON:  02/11/2008 FINDINGS: Cardiac shadow is within normal limits. Elevation right hemidiaphragm is again seen. The lungs are clear. No bony abnormality is noted. IMPRESSION: No active disease. Electronically Signed   By: Inez Catalina M.D.   On: 01/11/2017 21:02   Dg Hip Unilat With Pelvis 2-3 Views Left  Result Date: 01/11/2017 CLINICAL DATA:  Fall today with left hip pain, initial encounter EXAM: DG HIP (WITH OR WITHOUT PELVIS) 3V LEFT COMPARISON:  None. FINDINGS: Pelvic ring is intact. Intratrochanteric fracture with mild displacement is seen in the proximal left femur. Mild degenerative changes of the lumbar spine and hip joints are noted. IMPRESSION: Left intratrochanteric fracture. Electronically Signed   By: Inez Catalina M.D.   On: 01/11/2017 20:13    EKG: Independently reviewed.  Assessment/Plan Principal Problem:   Closed intertrochanteric fracture, left, initial encounter ([place]) Active Problems:   HYPERTENSION, BENIGN SYSTEMIC    1. L hip fx - 1. Hip fx pathway 2. See Dr. Lyla Glassing note 3. Plan for OR tomorrow 4. DVT chemo ppx delayed till after surgery (see Dr. Lyla Glassing note) 5. NPO after midnight 2. HTN - 1. Continue home BP meds  DVT prophylaxis: SCDs, holding chemo PPX per Dr. Lyla Glassing note Code Status: Full Family Communication: Family at bedside Disposition Plan: To rehab after admit most likely Consults called: Dr. Lyla Glassing note in chart Admission status: Admit to inpatient   Etta Quill DO [facility] Pager [phone]  If 7AM-7PM, please contact day team taking care of patient [email or web address] Password [email or web address]  01/11/2017, 10:09 PM

## 2017-01-11 NOTE — ED Notes (Signed)
Bed: WA20 Expected date:  Expected time:  Means of arrival:  Comments: EMS/Fall/hip pain

## 2017-01-11 NOTE — ED Triage Notes (Signed)
Pt BIB [facility] from home c/o left hip pain following a fall outside. Pt states that she tripped. Denies LOC or blood thinners. No shortening or rotation noted upon inspection. Pt given 100mg  Fentanyl en route. Last dose 1835. A&Ox4.

## 2017-01-11 NOTE — Consult Note (Signed)
ORTHOPAEDIC CONSULTATION  REQUESTING PHYSICIAN: Etta Quill, DO  PCP:  Antony Blackbird, MD  Chief Complaint: Left hip fracture  HPI: Terri Liu is a 83 y.o. female who complains of Left hip injury after a mechanical fall earlier today. She had left hip pain and inability to weight-bear, and she was brought to the emergency department at [facility] revealed a minimally displaced left intertrochanteric femur fracture. She also sustained superficial abrasion and contusion to the left knee. She had recent left total knee arthroplasty about 6 months ago by Dr. Susa Day. X-rays of her left knee were negative.Orthopedic consultation was placed for management of her left hip fracture. Hospitalist consult has been placed for admission. She denies other injuries.  Past Medical History:  Diagnosis Date  . Arthritis   . Diarrhea    "sometimes ., due to Gallbladder removed"  . High cholesterol   . History of shingles    effecrt nerve sees neurologist in [facility]  . Hypertension   . Post herpetic neuralgia 08/15/2016   Right V1  . Shingles    Past Surgical History:  Procedure Laterality Date  . ABDOMINAL HYSTERECTOMY     COMPLETE  . CARPAL TUNNEL RELEASE Bilateral   . CHOLECYSTECTOMY    . EYE SURGERY     Cornea  . GAS INSERTION Right 01/27/2015   Procedure: INSERTION OF GAS;  Surgeon: Hayden Pedro, MD;  Location: [facility];  Service: Ophthalmology;  Laterality: Right;  C3F8  . JOINT REPLACEMENT Right 4 YRS AGO   KNEE  . KNEE ARTHROSCOPY Bilateral   . MEMBRANE PEEL Right 01/27/2015   Procedure: MEMBRANE PEEL;  Surgeon: Hayden Pedro, MD;  Location: [facility];  Service: Ophthalmology;  Laterality: Right;  . PERFLUORONE INJECTION Right 01/27/2015   Procedure: PERFLUORONE INJECTION;  Surgeon: Hayden Pedro, MD;  Location: [facility];  Service: Ophthalmology;  Laterality: Right;  . PHOTOCOAGULATION WITH LASER Right 01/27/2015   Procedure: PHOTOCOAGULATION WITH LASER;   Surgeon: Hayden Pedro, MD;  Location: [facility];  Service: Ophthalmology;  Laterality: Right;  ENDOLASER  . 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 TOTAL KNEE ARTHROPLASTY Left 12/17/2015   Procedure: LEFT TOTAL KNEE ARTHROPLASTY;  Surgeon: Susa Day, MD;  [facility];  Service: Orthopedics;  Laterality: Left;  Marland Kitchen VITRECTOMY 25 GAUGE WITH SCLERAL BUCKLE Right 01/27/2015   Procedure: VITRECTOMY 25 GAUGE WITH SCLERAL BUCKLE;  Surgeon: Hayden Pedro, MD;  Location: [facility];  Service: Ophthalmology;  Laterality: Right;   Social History   Social History  . Marital status: Married    Spouse name: N/A  . Number of children: 3  . Years of education: 10   Occupational History  . N/A    Social History Main Topics  . Smoking status: Never Smoker  . Smokeless tobacco: Never Used  . Alcohol use No  . Drug use: No  . Sexual activity: Not Asked   Other Topics Concern  . None   Social History Narrative   Lives at home w/ her husband   Right-handed   Caffeine: 2 cups of half caf coffee each morning   Family History  Problem Relation Age of Onset  . Heart failure Mother   . Cancer - Prostate Father   . Heart disease Brother   . Cancer  Sister   . Cancer Brother    Allergies  Allergen Reactions  . Tape     Pulled skin off. Paper tape is ok.   . Codeine Rash   Prior to Admission medications   Medication Sig Start Date End Date Taking? Authorizing Provider  aspirin EC 81 MG tablet Take 81 mg by mouth daily.   Yes Historical Provider, MD  calcium carbonate (OS-CAL) 600 MG TABS Take 600 mg by mouth daily.   Yes Historical Provider, MD  cyanocobalamin 500 MCG tablet Take 500 mcg by mouth 2 (two) times daily.   Yes Historical Provider, MD  cycloSPORINE (RESTASIS) 0.05 % ophthalmic emulsion Place 1 drop into  both eyes 2 (two) times daily.   Yes Historical Provider, MD  hydrochlorothiazide (HYDRODIURIL) 25 MG tablet Take 25 mg by mouth at bedtime.    Yes Historical Provider, MD  lovastatin (MEVACOR) 40 MG tablet take 1 tablet once a day orally 90 days 07/29/16  Yes Historical Provider, MD  metoprolol tartrate (LOPRESSOR) 25 MG tablet Take 25 mg by mouth 2 (two) times daily. 05/23/16  Yes Historical Provider, MD  Misc Natural Products (TART CHERRY ADVANCED PO) Take by mouth daily.   Yes Historical Provider, MD  OXcarbazepine (TRILEPTAL) 150 MG tablet Take 1 tablet (150 mg total) by mouth 2 (two) times daily. 08/15/16  Yes Kathrynn Ducking, MD  Prenatal Vit-Fe Fumarate-FA (PRENATAL MULTIVITAMIN) TABS Take 1 tablet by mouth every morning.    Yes Historical Provider, MD  valsartan (DIOVAN) 160 MG tablet Take 160 mg by mouth at bedtime.    Yes Historical Provider, MD   Dg Knee 2 Views Left  Result Date: 01/11/2017 CLINICAL DATA:  Recent fall with left knee pain, initial encounter EXAM: LEFT KNEE - 1-2 VIEW COMPARISON:  12/17/2015 FINDINGS: Left knee prosthesis is noted. No acute bony abnormality is seen. Calcified loose body is noted posteriorly. IMPRESSION: No acute abnormality noted. Electronically Signed   By: Inez Catalina M.D.   On: 01/11/2017 20:14   Dg Chest Port 1 View  Result Date: 01/11/2017 CLINICAL DATA:  Preoperative evaluation for left hip fracture EXAM: PORTABLE CHEST 1 VIEW COMPARISON:  02/11/2008 FINDINGS: Cardiac shadow is within normal limits. Elevation right hemidiaphragm is again seen. The lungs are clear. No bony abnormality is noted. IMPRESSION: No active disease. Electronically Signed   By: Inez Catalina M.D.   On: 01/11/2017 21:02   Dg Hip Unilat With Pelvis 2-3 Views Left  Result Date: 01/11/2017 CLINICAL DATA:  Fall today with left hip pain, initial encounter EXAM: DG HIP (WITH OR WITHOUT PELVIS) 3V LEFT COMPARISON:  None. FINDINGS: Pelvic ring is intact. Intratrochanteric fracture with  mild displacement is seen in the proximal left femur. Mild degenerative changes of the lumbar spine and hip joints are noted. IMPRESSION: Left intratrochanteric fracture. Electronically Signed   By: Inez Catalina M.D.   On: 01/11/2017 20:13    Positive ROS: All other systems have been reviewed and were otherwise negative with the exception of those mentioned in the HPI and as above.  Physical Exam: General: Alert, no acute distress Cardiovascular: No pedal edema Respiratory: No cyanosis, no use of accessory musculature GI: No organomegaly, abdomen is soft and non-tender Skin: No lesions in the area of chief complaint Neurologic: Sensation intact distally Psychiatric: Patient is competent for consent with normal mood and affect Lymphatic: No axillary or cervical lymphadenopathy  MUSCULOSKELETAL: Examination of the left lower extremity reveals that she is shortened and rotated. She has  tenderness to palpation over the hip. She has pain with attempted logrolling of the hip. She has a healed anterior knee incision. She has a superficial abrasion and small hematoma lateral to the incision. She has palpable pedal pulses. She has intact motor function dorsiflexion, plantarflexion, and great toe extension, with strength limited by hip pain. She reports intact sensation to light touch.  Assessment: #1 left intertrochanteric femur fracture #2 left knee contusion #3 history of left total knee arthroplasty  Plan: I discussed the findings with the patient and her family. I recommended intramedullary fixation of her left intertrochanteric femur fracture. We discussed the risks, benefits, and alternatives. She will be nothing by mouth after midnight. Hold chemical DVT prophylaxis. We'll plan for surgery tomorrow afternoon/evening. All questions were solicited and answered.  The risks, benefits, and alternatives were discussed with the patient. There are risks associated with the surgery including, but not  limited to, problems with anesthesia (death), infection, differences in leg length/angulation/rotation, fracture of bones, loosening or failure of implants, malunion, nonunion, hematoma (blood accumulation) which may require surgical drainage, blood clots, pulmonary embolism, nerve injury (foot drop), and blood vessel injury. The patient understands these risks and elects to proceed.   Benzion Mesta, Horald Pollen, MD Cell [phone]    01/11/2017 10:00 PM

## 2017-01-11 NOTE — ED Provider Notes (Signed)
Emergency Department Provider Note   I have reviewed the triage vital signs and the nursing notes.   HISTORY  Chief Complaint Fall and Hip Pain   HPI Terri Liu is a 83 y.o. female with PMH of arthritis, HLD, and HTN for evaluation of severe left hip and knee pain after mechanical fall earlier today. Patient states that she was walking outside when she tripped and fell. No chest pain, difficulty breathing, loss of consciousness. No head injury. The patient takes aspirin daily but no other anticoagulation. Patient with severe left hip pain with any movement. No numbness or tingling in the leg. No back pain. Patient also with some left knee and left elbow pain. She received Fentanyl with EMS with mild relief.    Past Medical History:  Diagnosis Date  . Arthritis   . Diarrhea    "sometimes ., due to Gallbladder removed"  . High cholesterol   . History of shingles    effecrt nerve sees neurologist in [facility]  . Hypertension   . Post herpetic neuralgia 08/15/2016   Right V1  . Shingles     Patient Active Problem List   Diagnosis Date Noted  . Closed intertrochanteric fracture, left, initial encounter ([place]) 01/11/2017  . Post herpetic neuralgia 08/15/2016  . Hereditary and idiopathic peripheral neuropathy 08/15/2016  . Primary osteoarthritis of left knee 12/17/2015  . Left knee DJD 12/17/2015  . 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 Epiretinal membrane 01/27/2015  . HYPERLIPIDEMIA 11/09/2006  . OBESITY, NOS 11/09/2006  . DEPRESSION, MAJOR, RECURRENT 11/09/2006  . NEUROGENIC BLADDER 11/09/2006  . HYPERTENSION, BENIGN SYSTEMIC 11/09/2006  . ARTHRITIS 11/09/2006    Past Surgical History:  Procedure Laterality Date  . ABDOMINAL HYSTERECTOMY     COMPLETE  . CARPAL TUNNEL RELEASE Bilateral   . CHOLECYSTECTOMY    . EYE SURGERY     Cornea  . GAS INSERTION Right 01/27/2015   Procedure: INSERTION  OF GAS;  Surgeon: Hayden Pedro, MD;  Location: [place];  Service: Ophthalmology;  Laterality: Right;  C3F8  . JOINT REPLACEMENT Right 4 YRS AGO   KNEE  . KNEE ARTHROSCOPY Bilateral   . MEMBRANE PEEL Right 01/27/2015   Procedure: MEMBRANE PEEL;  Surgeon: Hayden Pedro, MD;  Location: [place];  Service: Ophthalmology;  Laterality: Right;  . PERFLUORONE INJECTION Right 01/27/2015   Procedure: PERFLUORONE INJECTION;  Surgeon: Hayden Pedro, MD;  Location: [place];  Service: Ophthalmology;  Laterality: Right;  . PHOTOCOAGULATION WITH LASER Right 01/27/2015   Procedure: PHOTOCOAGULATION WITH LASER;  Surgeon: Hayden Pedro, MD;  Location: [place];  Service: Ophthalmology;  Laterality: Right;  ENDOLASER  . 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   Procedure: REPAIR OF COMPLEX TRACTION RETINAL DETACHMENT;  Surgeon: Hayden Pedro, MD;  Location: [place];  Service: Ophthalmology;  Laterality: Right;  . TOTAL KNEE ARTHROPLASTY Left 12/17/2015   Procedure: LEFT TOTAL KNEE ARTHROPLASTY;  Surgeon: Susa Day, MD;  [facility];  Service: Orthopedics;  Laterality: Left;  Marland Kitchen VITRECTOMY 25 GAUGE WITH SCLERAL BUCKLE Right 01/27/2015   Procedure: VITRECTOMY 25 GAUGE WITH SCLERAL BUCKLE;  Surgeon: Hayden Pedro, MD;  Location: [place];  Service: Ophthalmology;  Laterality: Right;      Allergies Tape and Codeine  Family History  Problem Relation Age of Onset  . Heart failure Mother   . Cancer - Prostate Father   . Heart disease  Brother   . Cancer Sister   . Cancer Brother     Social History Social History  Substance Use Topics  . Smoking status: Never Smoker  . Smokeless tobacco: Never Used  . Alcohol use No    Review of Systems  Constitutional: No fever/chills Eyes: No visual changes. ENT: No sore throat. Cardiovascular: Denies chest pain. Respiratory: Denies shortness of breath. Gastrointestinal: No abdominal pain.  No nausea, no vomiting.  No diarrhea.  No  constipation. Genitourinary: Negative for dysuria. Musculoskeletal: Negative for back pain. Positive left knee and hip pain.  Skin: Negative for rash. Neurological: Negative for headaches, focal weakness or numbness.  10-point ROS otherwise negative.  ____________________________________________   PHYSICAL EXAM:  VITAL SIGNS: ED Triage Vitals  Enc Vitals Group     BP 01/11/17 1846 (!) 161/74     Pulse Rate 01/11/17 1846 90     Resp 01/11/17 1846 18     Temp 01/11/17 1846 98.1 F (36.7 C)     Temp Source 01/11/17 1846 Oral     SpO2 01/11/17 1846 96 %     Pain Score 01/11/17 1845 10   Constitutional: Alert and oriented. Well appearing and in no acute distress. Eyes: Conjunctivae are normal.  Head: Atraumatic. Nose: No congestion/rhinnorhea. Mouth/Throat: Mucous membranes are moist.  Oropharynx non-erythematous. Neck: No stridor. No cervical spine tenderness to palpation. Cardiovascular: Normal rate, regular rhythm. Good peripheral circulation. Grossly normal heart sounds.   Respiratory: Normal respiratory effort.  No retractions. Lungs CTAB. Gastrointestinal: Soft and nontender. No distention.  Musculoskeletal: Mild shortening of the left hip. No abnormal rotation. Exquisite pain with minimal movement of the left hip. No other gross deformities of extremities. Neurologic:  Normal speech and language. No gross focal neurologic deficits are appreciated.  Skin:  Skin is warm, dry and intact. No rash noted. Psychiatric: Mood and affect are normal. Speech and behavior are normal.  ____________________________________________   LABS (all labs ordered are listed, but only abnormal results are displayed)  Labs Reviewed  SURGICAL PCR SCREEN - Abnormal; Notable for the following:       Result Value   Staphylococcus aureus POSITIVE (*)    All other components within normal limits  BASIC METABOLIC PANEL - Abnormal; Notable for the following:    Sodium 126 (*)    Chloride 91 (*)      Glucose, Bld 120 (*)    All other components within normal limits  CBC WITH DIFFERENTIAL/PLATELET - Abnormal; Notable for the following:    WBC 15.8 (*)    MCHC 37.0 (*)    Neutro Abs 13.7 (*)    All other components within normal limits  BASIC METABOLIC PANEL - Abnormal; Notable for the following:    Sodium 128 (*)    Chloride 90 (*)    Glucose, Bld 110 (*)    All other components within normal limits  PROTIME-INR  CBC  TYPE AND SCREEN   ____________________________________________  RADIOLOGY  Dg Knee 2 Views Left  Result Date: 01/11/2017 CLINICAL DATA:  Recent fall with left knee pain, initial encounter EXAM: LEFT KNEE - 1-2 VIEW COMPARISON:  12/17/2015 FINDINGS: Left knee prosthesis is noted. No acute bony abnormality is seen. Calcified loose body is noted posteriorly. IMPRESSION: No acute abnormality noted. Electronically Signed   By: Inez Catalina M.D.   On: 01/11/2017 20:14   Dg Chest Port 1 View  Result Date: 01/11/2017 CLINICAL DATA:  Preoperative evaluation for left hip fracture EXAM: PORTABLE CHEST 1 VIEW  COMPARISON:  02/11/2008 FINDINGS: Cardiac shadow is within normal limits. Elevation right hemidiaphragm is again seen. The lungs are clear. No bony abnormality is noted. IMPRESSION: No active disease. Electronically Signed   By: Inez Catalina M.D.   On: 01/11/2017 21:02   Dg Hip Unilat With Pelvis 2-3 Views Left  Result Date: 01/11/2017 CLINICAL DATA:  Fall today with left hip pain, initial encounter EXAM: DG HIP (WITH OR WITHOUT PELVIS) 3V LEFT COMPARISON:  None. FINDINGS: Pelvic ring is intact. Intratrochanteric fracture with mild displacement is seen in the proximal left femur. Mild degenerative changes of the lumbar spine and hip joints are noted. IMPRESSION: Left intratrochanteric fracture. Electronically Signed   By: Inez Catalina M.D.   On: 01/11/2017 20:13    ____________________________________________   PROCEDURES  Procedure(s) performed:    Procedures  None ____________________________________________   INITIAL IMPRESSION / ASSESSMENT AND PLAN / ED COURSE  Pertinent labs & imaging results that were available during my care of the patient were reviewed by me and considered in my medical decision making (see chart for details).  Patient presents to the emergency department for evaluation of left hip and knee pain after mechanical fall. She is not anticoagulated. No head trauma. The left lower extremity is neurovascularly intact. She has exquisite tenderness with any range of motion of the left hip. Clinically suspicious for fracture. Will follow plain films and treat the patient NPO.   Spoke with Dr. Rex Kras with ortho who will fit patient in to OR schedule tomorrow at [facility]. No need for transfer to [facility]. NPO after midnight.   Discussed patient's case with hospitalist, Dr. Alcario Drought. Patient and family (if present) updated with plan. Care transferred to hospitalist service.  I reviewed all nursing notes, vitals, pertinent old records, EKGs, labs, imaging (as available).  ____________________________________________  FINAL CLINICAL IMPRESSION(S) / ED DIAGNOSES  Final diagnoses:  Closed fracture of left hip, initial encounter [facility])     MEDICATIONS GIVEN DURING THIS VISIT:  Medications  morphine 4 MG/ML injection 2 mg (2 mg Intravenous Given 01/12/17 1052)  HYDROcodone-acetaminophen (NORCO/VICODIN) 5-325 MG per tablet 1-2 tablet (1 tablet Oral Given 01/12/17 0018)  morphine 4 MG/ML injection 0.52 mg (not administered)  calcium carbonate (OS-CAL - dosed in mg of elemental calcium) tablet 1,250 mg (1,250 mg Oral Not Given 01/12/17 1114)  cyanocobalamin tablet 500 mcg (500 mcg Oral Not Given 01/12/17 1114)  cycloSPORINE (RESTASIS) 0.05 % ophthalmic emulsion 1 drop (1 drop Both Eyes Not Given 01/12/17 1114)  hydrochlorothiazide (HYDRODIURIL) tablet 25 mg (25 mg Oral Not Given 01/12/17 0026)  pravastatin (PRAVACHOL) tablet 40 mg (40 mg  Oral Given 01/12/17 0018)  metoprolol tartrate (LOPRESSOR) tablet 25 mg (25 mg Oral Not Given 01/12/17 1115)  irbesartan (AVAPRO) tablet 150 mg (150 mg Oral Not Given 01/12/17 1115)  OXcarbazepine (TRILEPTAL) tablet 150 mg (150 mg Oral Not Given 01/12/17 1000)  prenatal multivitamin tablet 1 tablet (1 tablet Oral Not Given 01/12/17 1000)  chlorhexidine (HIBICLENS) 4 % liquid 4 application (not administered)  povidone-iodine 10 % swab 2 application (2 application Topical Not Given 01/12/17 0555)  ceFAZolin (ANCEF) IVPB 2g/100 mL premix (not administered)  ondansetron (ZOFRAN) injection 4 mg (4 mg Intravenous Given 01/12/17 0103)  morphine 4 MG/ML injection 4 mg (4 mg Intravenous Given 01/11/17 2118)     NEW OUTPATIENT MEDICATIONS STARTED DURING THIS VISIT:  None   Note:  This document was prepared using Dragon voice recognition software and may include unintentional dictation errors.  Nanda Quinton, MD  Emergency Medicine   Margette Fast, MD 01/12/17 [phone]

## 2017-01-12 ENCOUNTER — Encounter (HOSPITAL_COMMUNITY)
Admission: EM | Disposition: A | Discharge status: Skilled Nursing Facility | Payer: Self-pay | Source: Home / Self Care | Attending: Internal Medicine

## 2017-01-12 ENCOUNTER — Inpatient Hospital Stay (HOSPITAL_COMMUNITY): Payer: PPO

## 2017-01-12 ENCOUNTER — Encounter (HOSPITAL_COMMUNITY): Payer: Self-pay | Admitting: Certified Registered"

## 2017-01-12 ENCOUNTER — Inpatient Hospital Stay (HOSPITAL_COMMUNITY): Payer: PPO | Admitting: Anesthesiology

## 2017-01-12 DIAGNOSIS — E878 Other disorders of electrolyte and fluid balance, not elsewhere classified: Secondary | ICD-10-CM

## 2017-01-12 DIAGNOSIS — S72002A Fracture of unspecified part of neck of left femur, initial encounter for closed fracture: Secondary | ICD-10-CM

## 2017-01-12 DIAGNOSIS — E871 Hypo-osmolality and hyponatremia: Secondary | ICD-10-CM

## 2017-01-12 DIAGNOSIS — D72829 Elevated white blood cell count, unspecified: Secondary | ICD-10-CM

## 2017-01-12 DIAGNOSIS — S72142A Displaced intertrochanteric fracture of left femur, initial encounter for closed fracture: Secondary | ICD-10-CM | POA: Diagnosis present

## 2017-01-12 HISTORY — PX: FEMUR IM NAIL: SHX1597

## 2017-01-12 LAB — BASIC METABOLIC PANEL
Anion gap: 7 (ref 5–15)
BUN: 9 mg/dL (ref 6–20)
CALCIUM: 8.9 mg/dL (ref 8.9–10.3)
CO2: 31 mmol/L (ref 22–32)
CREATININE: 0.51 mg/dL (ref 0.44–1.00)
Chloride: 90 mmol/L — ABNORMAL LOW (ref 101–111)
GFR calc non Af Amer: >60 mL/min (ref >60)
Glucose, Bld: 110 mg/dL — ABNORMAL HIGH (ref 65–99)
Potassium: 3.8 mmol/L (ref 3.5–5.1)
SODIUM: 128 mmol/L — AB (ref 135–145)

## 2017-01-12 LAB — CBC
HCT: 36.4 % (ref 36.0–46.0)
Hemoglobin: 12.9 g/dL (ref 12.0–15.0)
MCH: 32.3 pg (ref 26.0–34.0)
MCHC: 35.4 g/dL (ref 30.0–36.0)
MCV: 91.2 fL (ref 78.0–100.0)
PLATELETS: 192 10*3/uL (ref 150–400)
RBC: 3.99 MIL/uL (ref 3.87–5.11)
RDW: 12.2 % (ref 11.5–15.5)
WBC: 8.7 10*3/uL (ref 4.0–10.5)

## 2017-01-12 LAB — SURGICAL PCR SCREEN
MRSA, PCR: NEGATIVE
Staphylococcus aureus: POSITIVE — AB

## 2017-01-12 SURGERY — INSERTION, INTRAMEDULLARY ROD, FEMUR
Anesthesia: Spinal | Laterality: Left

## 2017-01-12 MED ORDER — PHENOL 1.4 % MT LIQD
1.0000 | OROMUCOSAL | Status: DC | PRN
  Filled 2017-01-12: qty 177

## 2017-01-12 MED ORDER — ACETAMINOPHEN 325 MG PO TABS: 650.0000 mg | ORAL_TABLET | Freq: Four times a day (QID) | ORAL | Status: DC | PRN

## 2017-01-12 MED ORDER — LACTATED RINGERS IV SOLN
INTRAVENOUS | Status: DC | PRN
  Administered 2017-01-12 (×2): via INTRAVENOUS

## 2017-01-12 MED ORDER — PHENYLEPHRINE HCL 10 MG/ML IJ SOLN
INTRAVENOUS | Status: DC | PRN
  Administered 2017-01-12: 25 ug/min via INTRAVENOUS

## 2017-01-12 MED ORDER — ACETAMINOPHEN 650 MG RE SUPP: 650.0000 mg | Freq: Four times a day (QID) | RECTAL | Status: DC | PRN

## 2017-01-12 MED ORDER — SODIUM CHLORIDE 0.9 % IR SOLN
Status: DC | PRN
  Administered 2017-01-12: 1000 mL

## 2017-01-12 MED ORDER — PROPOFOL 10 MG/ML IV BOLUS
INTRAVENOUS | Status: AC
  Filled 2017-01-12: qty 20

## 2017-01-12 MED ORDER — MENTHOL 3 MG MT LOZG: 1.0000 | LOZENGE | OROMUCOSAL | Status: DC | PRN

## 2017-01-12 MED ORDER — ONDANSETRON HCL 4 MG/2ML IJ SOLN
INTRAMUSCULAR | Status: DC | PRN
  Administered 2017-01-12: 4 mg via INTRAVENOUS

## 2017-01-12 MED ORDER — PHENYLEPHRINE 40 MCG/ML (10ML) SYRINGE FOR IV PUSH (FOR BLOOD PRESSURE SUPPORT)
PREFILLED_SYRINGE | INTRAVENOUS | Status: AC
  Filled 2017-01-12: qty 10

## 2017-01-12 MED ORDER — DEXTROSE 5 % IV SOLN
500.0000 mg | Freq: Four times a day (QID) | INTRAVENOUS | Status: DC | PRN
Start: 2017-01-12 — End: 2017-01-14
  Administered 2017-01-12: 500 mg via INTRAVENOUS
  Filled 2017-01-12: qty 550
  Filled 2017-01-12: qty 5

## 2017-01-12 MED ORDER — PHENYLEPHRINE 40 MCG/ML (10ML) SYRINGE FOR IV PUSH (FOR BLOOD PRESSURE SUPPORT)
PREFILLED_SYRINGE | INTRAVENOUS | Status: DC | PRN
  Administered 2017-01-12 (×5): 80 ug via INTRAVENOUS

## 2017-01-12 MED ORDER — ONDANSETRON HCL 4 MG/2ML IJ SOLN
INTRAMUSCULAR | Status: AC
  Filled 2017-01-12: qty 2

## 2017-01-12 MED ORDER — METOCLOPRAMIDE HCL 5 MG PO TABS: 5.0000 mg | ORAL_TABLET | Freq: Three times a day (TID) | ORAL | Status: DC | PRN

## 2017-01-12 MED ORDER — CEFAZOLIN SODIUM-DEXTROSE 2-4 GM/100ML-% IV SOLN
2.0000 g | Freq: Four times a day (QID) | INTRAVENOUS | Status: AC
  Administered 2017-01-13 (×2): 2 g via INTRAVENOUS
  Filled 2017-01-12 (×2): qty 100

## 2017-01-12 MED ORDER — MEPERIDINE HCL 50 MG/ML IJ SOLN: 6.2500 mg | INTRAMUSCULAR | Status: DC | PRN

## 2017-01-12 MED ORDER — BUPIVACAINE HCL (PF) 0.5 % IJ SOLN
INTRAMUSCULAR | Status: DC | PRN
  Administered 2017-01-12: 3 mL

## 2017-01-12 MED ORDER — FENTANYL CITRATE (PF) 100 MCG/2ML IJ SOLN
INTRAMUSCULAR | Status: DC | PRN
  Administered 2017-01-12: 50 ug via INTRAVENOUS

## 2017-01-12 MED ORDER — PHENYLEPHRINE HCL 10 MG/ML IJ SOLN
INTRAMUSCULAR | Status: AC
  Filled 2017-01-12: qty 1

## 2017-01-12 MED ORDER — ONDANSETRON HCL 4 MG/2ML IJ SOLN
4.0000 mg | Freq: Four times a day (QID) | INTRAMUSCULAR | Status: DC | PRN
  Administered 2017-01-12: 01:00:00 4 mg via INTRAVENOUS
  Filled 2017-01-12: qty 2

## 2017-01-12 MED ORDER — METHOCARBAMOL 500 MG PO TABS
500.0000 mg | ORAL_TABLET | Freq: Four times a day (QID) | ORAL | Status: DC | PRN
  Administered 2017-01-13: 500 mg via ORAL
  Filled 2017-01-12: qty 1

## 2017-01-12 MED ORDER — LIDOCAINE 2% (20 MG/ML) 5 ML SYRINGE
INTRAMUSCULAR | Status: DC | PRN
  Administered 2017-01-12: 50 mg via INTRAVENOUS

## 2017-01-12 MED ORDER — PROPOFOL 10 MG/ML IV BOLUS
INTRAVENOUS | Status: DC | PRN
  Administered 2017-01-12 (×5): 20 mg via INTRAVENOUS

## 2017-01-12 MED ORDER — FENTANYL CITRATE (PF) 100 MCG/2ML IJ SOLN
INTRAMUSCULAR | Status: AC
  Filled 2017-01-12: qty 2

## 2017-01-12 MED ORDER — LACTATED RINGERS IV SOLN: INTRAVENOUS | Status: DC

## 2017-01-12 MED ORDER — ASPIRIN EC 81 MG PO TBEC
81.0000 mg | DELAYED_RELEASE_TABLET | Freq: Two times a day (BID) | ORAL | Status: DC
  Administered 2017-01-13 – 2017-01-14 (×2): 81 mg via ORAL
  Filled 2017-01-12 (×3): qty 1

## 2017-01-12 MED ORDER — LIDOCAINE 2% (20 MG/ML) 5 ML SYRINGE
INTRAMUSCULAR | Status: AC
Start: 2017-01-12 — End: 2017-01-12
  Filled 2017-01-12: qty 5

## 2017-01-12 MED ORDER — PROMETHAZINE HCL 25 MG/ML IJ SOLN: 6.2500 mg | INTRAMUSCULAR | Status: DC | PRN

## 2017-01-12 MED ORDER — ISOPROPYL ALCOHOL 70 % SOLN
Status: DC | PRN
Start: 2017-01-12 — End: 2017-01-12
  Administered 2017-01-12: 1 via TOPICAL

## 2017-01-12 MED ORDER — CEFAZOLIN SODIUM-DEXTROSE 2-4 GM/100ML-% IV SOLN
INTRAVENOUS | Status: AC
Start: 2017-01-12 — End: 2017-01-12
  Filled 2017-01-12: qty 100

## 2017-01-12 MED ORDER — HYDROMORPHONE HCL 1 MG/ML IJ SOLN: 0.2500 mg | INTRAMUSCULAR | Status: DC | PRN

## 2017-01-12 MED ORDER — METOCLOPRAMIDE HCL 5 MG/ML IJ SOLN: 5.0000 mg | Freq: Three times a day (TID) | INTRAMUSCULAR | Status: DC | PRN

## 2017-01-12 SURGICAL SUPPLY — 42 items
ADH SKN CLS APL DERMABOND .7 (GAUZE/BANDAGES/DRESSINGS) ×2
BAG SPEC THK2 15X12 ZIP CLS (MISCELLANEOUS)
BAG ZIPLOCK 12X15 (MISCELLANEOUS) IMPLANT
BIT DRILL CANN LG 4.3MM (BIT) IMPLANT
CHLORAPREP W/TINT 26ML (MISCELLANEOUS) ×3 IMPLANT
COVER PERINEAL POST (MISCELLANEOUS) ×3 IMPLANT
COVER SURGICAL LIGHT HANDLE (MISCELLANEOUS) ×3 IMPLANT
DERMABOND ADVANCED (GAUZE/BANDAGES/DRESSINGS) ×4
DERMABOND ADVANCED .7 DNX12 (GAUZE/BANDAGES/DRESSINGS) ×2 IMPLANT
DRAPE C-ARM 42X120 X-RAY (DRAPES) ×3 IMPLANT
DRAPE C-ARMOR (DRAPES) ×3 IMPLANT
DRAPE ORTHO SPLIT 77X108 STRL (DRAPES) ×6
DRAPE STERI IOBAN 125X83 (DRAPES) ×3 IMPLANT
DRAPE SURG ORHT 6 SPLT 77X108 (DRAPES) IMPLANT
DRAPE U-SHAPE 47X51 STRL (DRAPES) ×6 IMPLANT
DRILL BIT CANN LG 4.3MM (BIT) ×6
DRSG AQUACEL AG ADV 3.5X 4 (GAUZE/BANDAGES/DRESSINGS) ×2 IMPLANT
DRSG MEPILEX BORDER 4X4 (GAUZE/BANDAGES/DRESSINGS) ×9 IMPLANT
ELECT BLADE TIP CTD 4 INCH (ELECTRODE) ×3 IMPLANT
FACESHIELD WRAPAROUND (MASK) ×3 IMPLANT
FACESHIELD WRAPAROUND OR TEAM (MASK) ×1 IMPLANT
GAUZE SPONGE 4X4 12PLY STRL (GAUZE/BANDAGES/DRESSINGS) ×3 IMPLANT
GLOVE BIO SURGEON STRL SZ8.5 (GLOVE) ×6 IMPLANT
GLOVE BIOGEL PI IND STRL 8.5 (GLOVE) ×1 IMPLANT
GLOVE BIOGEL PI INDICATOR 8.5 (GLOVE) ×2
GOWN SPEC L3 XXLG W/TWL (GOWN DISPOSABLE) ×6 IMPLANT
GUIDEPIN 3.2X17.5 THRD DISP (PIN) ×3 IMPLANT
GUIDEWIRE BALL NOSE 100CM (WIRE) ×3 IMPLANT
KIT BASIN OR (CUSTOM PROCEDURE TRAY) ×3 IMPLANT
MANIFOLD NEPTUNE II (INSTRUMENTS) ×3 IMPLANT
MARKER SKIN DUAL TIP RULER LAB (MISCELLANEOUS) ×3 IMPLANT
NAIL HIP FRACT 130D 11X180 (Screw) ×2 IMPLANT
PACK TOTAL JOINT (CUSTOM PROCEDURE TRAY) ×3 IMPLANT
SCREW BONE CORTICAL 5.0X32 (Screw) ×3 IMPLANT
SCREW LAG 10.5MMX105MM HFN (Screw) ×2 IMPLANT
SUT MNCRL AB 3-0 PS2 18 (SUTURE) ×6 IMPLANT
SUT MON AB 2-0 CT1 36 (SUTURE) ×3 IMPLANT
SUT VIC AB 1 CT1 27 (SUTURE) ×6
SUT VIC AB 1 CT1 27XBRD ANTBC (SUTURE) ×2 IMPLANT
SUT VIC AB 2-0 CT1 27 (SUTURE) ×6
SUT VIC AB 2-0 CT1 27XBRD (SUTURE) ×2 IMPLANT
YANKAUER SUCT BULB TIP NO VENT (SUCTIONS) ×3 IMPLANT

## 2017-01-12 NOTE — H&P (View-Only) (Signed)
ORTHOPAEDIC CONSULTATION  REQUESTING PHYSICIAN: Etta Quill, DO  PCP:  Antony Blackbird, MD  Chief Complaint: Left hip fracture  HPI: Terri Liu is a 83 y.o. female who complains of Left hip injury after a mechanical fall earlier today. She had left hip pain and inability to weight-bear, and she was brought to the emergency department at [place] revealed a minimally displaced left intertrochanteric femur fracture. She also sustained superficial abrasion and contusion to the left knee. She had recent left total knee arthroplasty about 6 months ago by Dr. Susa Day. X-rays of her left knee were negative.Orthopedic consultation was placed for management of her left hip fracture. Hospitalist consult has been placed for admission. She denies other injuries.  Past Medical History:  Diagnosis Date  . Arthritis   . Diarrhea    "sometimes ., due to Gallbladder removed"  . High cholesterol   . History of shingles    effecrt nerve sees neurologist in [facility]  . Hypertension   . Post herpetic neuralgia 08/15/2016   Right V1  . Shingles    Past Surgical History:  Procedure Laterality Date  . ABDOMINAL HYSTERECTOMY     COMPLETE  . CARPAL TUNNEL RELEASE Bilateral   . CHOLECYSTECTOMY    . EYE SURGERY     Cornea  . GAS INSERTION Right 01/27/2015   Procedure: INSERTION OF GAS;  Surgeon: Hayden Pedro, MD;  Location: [place];  Service: Ophthalmology;  Laterality: Right;  C3F8  . JOINT REPLACEMENT Right 4 YRS AGO   KNEE  . KNEE ARTHROSCOPY Bilateral   . MEMBRANE PEEL Right 01/27/2015   Procedure: MEMBRANE PEEL;  Surgeon: Hayden Pedro, MD;  Location: [place];  Service: Ophthalmology;  Laterality: Right;  . PERFLUORONE INJECTION Right 01/27/2015   Procedure: PERFLUORONE INJECTION;  Surgeon: Hayden Pedro, MD;  Location: [place];  Service: Ophthalmology;  Laterality: Right;  . PHOTOCOAGULATION WITH LASER Right 01/27/2015   Procedure: PHOTOCOAGULATION WITH LASER;   Surgeon: Hayden Pedro, MD;  Location: [place];  Service: Ophthalmology;  Laterality: Right;  ENDOLASER  . 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   Procedure: REPAIR OF COMPLEX TRACTION RETINAL DETACHMENT;  Surgeon: Hayden Pedro, MD;  Location: [place];  Service: Ophthalmology;  Laterality: Right;  . TOTAL KNEE ARTHROPLASTY Left 12/17/2015   Procedure: LEFT TOTAL KNEE ARTHROPLASTY;  Surgeon: Susa Day, MD;  [facility];  Service: Orthopedics;  Laterality: Left;  Marland Kitchen VITRECTOMY 25 GAUGE WITH SCLERAL BUCKLE Right 01/27/2015   Procedure: VITRECTOMY 25 GAUGE WITH SCLERAL BUCKLE;  Surgeon: Hayden Pedro, MD;  Location: [place];  Service: Ophthalmology;  Laterality: Right;   Social History   Social History  . Marital status: Married    Spouse name: N/A  . Number of children: 3  . Years of education: 10   Occupational History  . N/A    Social History Main Topics  . Smoking status: Never Smoker  . Smokeless tobacco: Never Used  . Alcohol use No  . Drug use: No  . Sexual activity: Not Asked   Other Topics Concern  . None   Social History Narrative   Lives at home w/ her husband   Right-handed   Caffeine: 2 cups of half caf coffee each morning   Family History  Problem Relation Age of Onset  . Heart failure Mother   . Cancer - Prostate Father   . Heart disease Brother   . Cancer  Sister   . Cancer Brother    Allergies  Allergen Reactions  . Tape     Pulled skin off. Paper tape is ok.   . Codeine Rash   Prior to Admission medications   Medication Sig Start Date End Date Taking? Authorizing Provider  aspirin EC 81 MG tablet Take 81 mg by mouth daily.   Yes Historical Provider, MD  calcium carbonate (OS-CAL) 600 MG TABS Take 600 mg by mouth daily.   Yes Historical Provider, MD  cyanocobalamin 500 MCG tablet Take 500 mcg by mouth 2 (two) times daily.   Yes Historical Provider, MD  cycloSPORINE (RESTASIS) 0.05 % ophthalmic emulsion Place 1 drop into  both eyes 2 (two) times daily.   Yes Historical Provider, MD  hydrochlorothiazide (HYDRODIURIL) 25 MG tablet Take 25 mg by mouth at bedtime.    Yes Historical Provider, MD  lovastatin (MEVACOR) 40 MG tablet take 1 tablet once a day orally 90 days 07/29/16  Yes Historical Provider, MD  metoprolol tartrate (LOPRESSOR) 25 MG tablet Take 25 mg by mouth 2 (two) times daily. 05/23/16  Yes Historical Provider, MD  Misc Natural Products (TART CHERRY ADVANCED PO) Take by mouth daily.   Yes Historical Provider, MD  OXcarbazepine (TRILEPTAL) 150 MG tablet Take 1 tablet (150 mg total) by mouth 2 (two) times daily. 08/15/16  Yes Kathrynn Ducking, MD  Prenatal Vit-Fe Fumarate-FA (PRENATAL MULTIVITAMIN) TABS Take 1 tablet by mouth every morning.    Yes Historical Provider, MD  valsartan (DIOVAN) 160 MG tablet Take 160 mg by mouth at bedtime.    Yes Historical Provider, MD   Dg Knee 2 Views Left  Result Date: 01/11/2017 CLINICAL DATA:  Recent fall with left knee pain, initial encounter EXAM: LEFT KNEE - 1-2 VIEW COMPARISON:  12/17/2015 FINDINGS: Left knee prosthesis is noted. No acute bony abnormality is seen. Calcified loose body is noted posteriorly. IMPRESSION: No acute abnormality noted. Electronically Signed   By: Inez Catalina M.D.   On: 01/11/2017 20:14   Dg Chest Port 1 View  Result Date: 01/11/2017 CLINICAL DATA:  Preoperative evaluation for left hip fracture EXAM: PORTABLE CHEST 1 VIEW COMPARISON:  02/11/2008 FINDINGS: Cardiac shadow is within normal limits. Elevation right hemidiaphragm is again seen. The lungs are clear. No bony abnormality is noted. IMPRESSION: No active disease. Electronically Signed   By: Inez Catalina M.D.   On: 01/11/2017 21:02   Dg Hip Unilat With Pelvis 2-3 Views Left  Result Date: 01/11/2017 CLINICAL DATA:  Fall today with left hip pain, initial encounter EXAM: DG HIP (WITH OR WITHOUT PELVIS) 3V LEFT COMPARISON:  None. FINDINGS: Pelvic ring is intact. Intratrochanteric fracture with  mild displacement is seen in the proximal left femur. Mild degenerative changes of the lumbar spine and hip joints are noted. IMPRESSION: Left intratrochanteric fracture. Electronically Signed   By: Inez Catalina M.D.   On: 01/11/2017 20:13    Positive ROS: All other systems have been reviewed and were otherwise negative with the exception of those mentioned in the HPI and as above.  Physical Exam: General: Alert, no acute distress Cardiovascular: No pedal edema Respiratory: No cyanosis, no use of accessory musculature GI: No organomegaly, abdomen is soft and non-tender Skin: No lesions in the area of chief complaint Neurologic: Sensation intact distally Psychiatric: Patient is competent for consent with normal mood and affect Lymphatic: No axillary or cervical lymphadenopathy  MUSCULOSKELETAL: Examination of the left lower extremity reveals that she is shortened and rotated. She has  tenderness to palpation over the hip. She has pain with attempted logrolling of the hip. She has a healed anterior knee incision. She has a superficial abrasion and small hematoma lateral to the incision. She has palpable pedal pulses. She has intact motor function dorsiflexion, plantarflexion, and great toe extension, with strength limited by hip pain. She reports intact sensation to light touch.  Assessment: #1 left intertrochanteric femur fracture #2 left knee contusion #3 history of left total knee arthroplasty  Plan: I discussed the findings with the patient and her family. I recommended intramedullary fixation of her left intertrochanteric femur fracture. We discussed the risks, benefits, and alternatives. She will be nothing by mouth after midnight. Hold chemical DVT prophylaxis. We'll plan for surgery tomorrow afternoon/evening. All questions were solicited and answered.  The risks, benefits, and alternatives were discussed with the patient. There are risks associated with the surgery including, but not  limited to, problems with anesthesia (death), infection, differences in leg length/angulation/rotation, fracture of bones, loosening or failure of implants, malunion, nonunion, hematoma (blood accumulation) which may require surgical drainage, blood clots, pulmonary embolism, nerve injury (foot drop), and blood vessel injury. The patient understands these risks and elects to proceed.   Giovana Faciane, Horald Pollen, MD Cell [phone]    01/11/2017 10:00 PM

## 2017-01-12 NOTE — NC FL2 (Signed)
[place] MEDICAID FL2 LEVEL OF CARE SCREENING TOOL     IDENTIFICATION  Patient Name: Terri Liu Birthdate: 22-Jan-1934 Sex: female Admission Date (Current Location): 01/11/2017  [facility] and [place] Number:  Herbalist and Address:  [facility],  [place] [address] [place]      Provider Number: [id number]  Attending Physician Name and Address:  Cristal Ford, DO  Relative Name and Phone Number:       Current Level of Care: Hospital Recommended Level of Care: [place] Prior Approval Number:    Date Approved/Denied:   PASRR Number: [id number]  Discharge Plan: SNF    Current Diagnoses: Patient Active Problem List   Diagnosis Date Noted  . Closed intertrochanteric fracture, left, initial encounter ([place]) 01/11/2017  . Post herpetic neuralgia 08/15/2016  . Hereditary and idiopathic peripheral neuropathy 08/15/2016  . Primary osteoarthritis of left knee 12/17/2015  . Left knee DJD 12/17/2015  . 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 Epiretinal membrane 01/27/2015  . HYPERLIPIDEMIA 11/09/2006  . OBESITY, NOS 11/09/2006  . DEPRESSION, MAJOR, RECURRENT 11/09/2006  . NEUROGENIC BLADDER 11/09/2006  . HYPERTENSION, BENIGN SYSTEMIC 11/09/2006  . ARTHRITIS 11/09/2006    Orientation RESPIRATION BLADDER Height & Weight     Self, Time, Situation, Place  Normal Continent Weight: 178 lb 12.7 oz (81.1 kg) (81.1 kg) Height:  5\' 3"  (160 cm) (5'3 1/2")  BEHAVIORAL SYMPTOMS/MOOD NEUROLOGICAL BOWEL NUTRITION STATUS  Other (Comment) (no behaviors)   Continent Diet  AMBULATORY STATUS COMMUNICATION OF NEEDS Skin   Extensive Assist Verbally Surgical wounds                       Personal Care Assistance Level of Assistance  Bathing, Feeding, Dressing Bathing Assistance: Limited assistance Feeding assistance: Independent Dressing Assistance: Limited assistance    Functional Limitations Info  Sight, Hearing, Speech Sight Info: Adequate Hearing Info: Adequate Speech Info: Adequate    SPECIAL CARE FACTORS FREQUENCY  PT (By licensed PT), OT (By licensed OT)     PT Frequency: 5x wk OT Frequency: 5x wk            Contractures Contractures Info: Not present    Additional Factors Info  Code Status, Allergies Code Status Info: Full Code Allergies Info: CODEINE, Tape           Current Medications (01/12/2017):  This is the current hospital active medication list Current Facility-Administered Medications  Medication Dose Route Frequency Provider Last Rate Last Dose  . calcium carbonate (OS-CAL - dosed in mg of elemental calcium) tablet 1,250 mg  1,250 mg Oral Daily Jared M Gardner, DO      . ceFAZolin (ANCEF) IVPB 2g/100 mL premix  2 g Intravenous On Call to [place], MD      . chlorhexidine (HIBICLENS) 4 % liquid 4 application  60 mL Topical Once Rod Can, MD      . cyanocobalamin tablet 500 mcg  500 mcg Oral BID Etta Quill, DO      . cycloSPORINE (RESTASIS) 0.05 % ophthalmic emulsion 1 drop  1 drop Both Eyes BID Etta Quill, DO      . hydrochlorothiazide (HYDRODIURIL) tablet 25 mg  25 mg Oral QHS Etta Quill, DO      . HYDROcodone-acetaminophen (NORCO/VICODIN) 5-325 MG per tablet 1-2 tablet  1-2 tablet Oral Q6H PRN Etta Quill, DO   1 tablet  at 01/12/17 0018  . irbesartan (AVAPRO) tablet 150 mg  150 mg Oral Daily Etta Quill, DO      . metoprolol tartrate (LOPRESSOR) tablet 25 mg  25 mg Oral BID Etta Quill, DO   25 mg at 01/12/17 0017  . morphine 4 MG/ML injection 0.52 mg  0.52 mg Intravenous Q2H PRN Etta Quill, DO      . morphine 4 MG/ML injection 2 mg  2 mg Intravenous Q1H PRN Margette Fast, MD   2 mg at 01/12/17 1052  . ondansetron (ZOFRAN) injection 4 mg  4 mg Intravenous Q6H PRN Jeryl Columbia, NP   4 mg at 01/12/17 0103  . OXcarbazepine (TRILEPTAL) tablet 150 mg  150 mg Oral BID Etta Quill, DO   150 mg at 01/12/17 0019  . povidone-iodine 10 % swab 2 application  2 application Topical Once Rod Can, MD      . pravastatin (PRAVACHOL) tablet 40 mg  40 mg Oral q1800 Etta Quill, DO   40 mg at 01/12/17 0018  . prenatal multivitamin tablet 1 tablet  1 tablet Oral q morning - 10a Etta Quill, DO         Discharge Medications: Please see discharge summary for a list of discharge medications.  Relevant Imaging Results:  Relevant Lab Results:   Additional Information SS # [id number]  Jahmarion Popoff, Randall An, LCSW

## 2017-01-12 NOTE — Transfer of Care (Signed)
Immediate Anesthesia Transfer of Care Note  Patient: Terri Liu  Procedure(s) Performed: Procedure(s): INTRAMEDULLARY (IM) NAIL FEMORAL LEFT (Left)  Patient Location: PACU  Anesthesia Type:Spinal  Level of Consciousness: awake, alert  and oriented  Airway & Oxygen Therapy: Patient Spontanous Breathing and Patient connected to face mask oxygen  Post-op Assessment: Report given to RN  Post vital signs: Reviewed and stable  Last Vitals:  Vitals:   01/12/17 0527 01/12/17 2130  BP: (!) 121/59 (!) (P) 148/71  Pulse: 67 (P) 85  Resp: 16 (P) 14  Temp: 36.9 C (P) 37.2 C    Last Pain:  Vitals:   01/12/17 1706  TempSrc:   PainSc: 3       Patients Stated Pain Goal: 2 (52/77/82 4235)  Complications: No apparent anesthesia complications

## 2017-01-12 NOTE — Interval H&P Note (Signed)
History and Physical Interval Note:  01/12/2017 7:24 PM  Terri Liu  has presented today for surgery, with the diagnosis of left intertrochanteric femur fracture  The various methods of treatment have been discussed with the patient and family. After consideration of risks, benefits and other options for treatment, the patient has consented to  Procedure(s): INTRAMEDULLARY (IM) NAIL FEMORAL LEFT (Left) as a surgical intervention .  The patient's history has been reviewed, patient examined, no change in status, stable for surgery.  I have reviewed the patient's chart and labs.  Questions were answered to the patient's satisfaction.     [name], Horald Pollen

## 2017-01-12 NOTE — Progress Notes (Signed)
Patient complain of nausea, paged [facility] hospital[facility] for a request for nausea medicine, awaiting call back or new order

## 2017-01-12 NOTE — Transfer of Care (Signed)
Immediate Anesthesia Transfer of Care Note  Patient: Terri Liu  Procedure(s) Performed: Procedure(s): INTRAMEDULLARY (IM) NAIL FEMORAL LEFT (Left)  Patient Location: PACU  Anesthesia Type:Regional and Spinal  Level of Consciousness: awake, alert  and oriented  Airway & Oxygen Therapy: Patient Spontanous Breathing and Patient connected to face mask oxygen  Post-op Assessment: Report given to RN and Post -op Vital signs reviewed and stable  Post vital signs: Reviewed and stable  Last Vitals:  Vitals:   01/11/17 2200 01/12/17 0527  BP: (!) 143/78 (!) 121/59  Pulse: 90 67  Resp: (!) 21 16  Temp:  36.9 C    Last Pain:  Vitals:   01/12/17 1706  TempSrc:   PainSc: 3       Patients Stated Pain Goal: 2 (57/97/28 2060)  Complications: No apparent anesthesia complications

## 2017-01-12 NOTE — Clinical Social Work Note (Signed)
Clinical Social Work Assessment  Patient Details  Name: Terri Liu MRN: [id number] Date of Birth: 1933-10-02  Date of referral:  01/12/17               Reason for consult:  Discharge Planning                Permission sought to share information with:  Facility Art therapist granted to share information::  Yes, Verbal Permission Granted  Name::        Agency::     Relationship::     Contact Information:     Housing/Transportation Living arrangements for the past 2 months:  [place] of Information:  Patient, Adult Children Patient Interpreter Needed:  None Criminal Activity/Legal Involvement Pertinent to Current Situation/Hospitalization:  No - Comment as needed Significant Relationships:  Spouse, Adult Children Lives with:  Spouse Do you feel safe going back to the place where you live?   (SNF needed.) Need for family participation in patient care:  Yes (Comment)  Care giving concerns: Pt's care cannot be managed at home following hospital dc.   Social Worker assessment / plan:  Pt hospitalized from home after falling and fracturing her hip. Hip surgery is scheduled for today. CSW met with pt / daughter in law Terri Liu at bed side this am to assist with d/c planning. Pt / family feel ST Rehab will be needed at d/c. SNF search has been initiated and bed offers are pending. CSW will request [facility] medicare authorization prior to dc. CSW will continue to follow to assist with d/c planning needs.  Employment status:  Retired Nurse, adult PT Recommendations:  Not assessed at this time Information / Referral to community resources:  [place]  Patient/Family's Response to care:  Pt / family requesting ST Rehab placement.  Patient/Family's Understanding of and Emotional Response to Diagnosis, Current Treatment, and Prognosis:  Pt is aware of her medical status and is looking forward to her hip  surgery being completed. She feels her spouse won't be able to care for her at home and is requesting ST Rehab. Family supports her decision. Pt / family appreciate CSW assistance with d/c planning.  Emotional Assessment Appearance:  Appears stated age Attitude/Demeanor/Rapport:  Other (cooperative) Affect (typically observed):  Calm, Accepting, Appropriate Orientation:  Oriented to Self, Oriented to Place, Oriented to  Time, Oriented to Situation Alcohol / Substance use:  Not Applicable Psych involvement (Current and /or in the community):  No (Comment)  Discharge Needs  Concerns to be addressed:  Discharge Planning Concerns Readmission within the last 30 days:  Yes Current discharge risk:  None Barriers to Discharge:  No Barriers Identified   Terri Liu, [place] 01/12/2017, 11:40 AM

## 2017-01-12 NOTE — Anesthesia Preprocedure Evaluation (Signed)
Anesthesia Evaluation  Patient identified by MRN, date of birth, ID band Patient awake    Reviewed: Allergy & Precautions, NPO status , Patient's Chart, lab work & pertinent test results  Airway Mallampati: II  TM Distance: <3 FB Neck ROM: full    Dental  (+) Edentulous Upper, Edentulous Lower, Dental Advisory Given   Pulmonary    breath sounds clear to auscultation       Cardiovascular hypertension, Pt. on medications and Pt. on home beta blockers  Rhythm:regular Rate:Normal     Neuro/Psych Depression  Neuromuscular disease    GI/Hepatic   Endo/Other  obese  Renal/GU      Musculoskeletal  (+) Arthritis ,   Abdominal   Peds  Hematology   Anesthesia Other Findings Dentures out to labelled cup to husband  Reproductive/Obstetrics                             Anesthesia Physical  Anesthesia Plan  ASA: III  Anesthesia Plan: Spinal   Post-op Pain Management:    Induction:   Airway Management Planned:   Additional Equipment:   Intra-op Plan:   Post-operative Plan:   Informed Consent: I have reviewed the patients History and Physical, chart, labs and discussed the procedure including the risks, benefits and alternatives for the proposed anesthesia with the patient or authorized representative who has indicated his/her understanding and acceptance.   Dental advisory given  Plan Discussed with: CRNA  Anesthesia Plan Comments:         Anesthesia Quick Evaluation

## 2017-01-12 NOTE — Clinical Social Work Placement (Signed)
   CLINICAL SOCIAL WORK PLACEMENT  NOTE  Date:  01/12/2017  Patient Details  Name: Terri Liu MRN: [id number] Date of Birth: [birth year]  Clinical Social Work is seeking post-discharge placement for this patient at the [place] level of care (*CSW will initial, date and re-position this form in  chart as items are completed):  Yes   Patient/family provided with [place] Work Department's list of facilities offering this level of care within the geographic area requested by the patient (or if unable, by the patient's family).  Yes   Patient/family informed of their freedom to choose among providers that offer the needed level of care, that participate in Medicare, Medicaid or managed care program needed by the patient, have an available bed and are willing to accept the patient.  Yes   Patient/family informed of [facility] [facility]'s ownership interest in [facility] and [facility], as well as of the fact that they are under no obligation to receive care at these facilities.  PASRR submitted to EDS on 01/12/17     PASRR number received on 01/12/17     Existing PASRR number confirmed on       FL2 transmitted to all facilities in geographic area requested by pt/family on 01/12/17     FL2 transmitted to all facilities within larger geographic area on       Patient informed that his/her managed care company has contracts with or will negotiate with certain facilities, including the following:            Patient/family informed of bed offers received.  Patient chooses bed at       Physician recommends and patient chooses bed at      Patient to be transferred to   on  .  Patient to be transferred to facility by       Patient family notified on   of transfer.  Name of family member notified:        PHYSICIAN       Additional Comment:    _______________________________________________ [name], [place] 01/12/2017, 12:06  PM

## 2017-01-12 NOTE — Progress Notes (Signed)
Nutrition Brief Note  Patient identified for consult per Hip Fracture protocol.   Wt Readings from Last 15 Encounters:  01/12/17 178 lb 12.7 oz (81.1 kg)  08/15/16 168 lb 8 oz (76.4 kg)  12/17/15 166 lb (75.3 kg)  12/09/15 166 lb 3.2 oz (75.4 kg)  01/27/15 164 lb 8 oz (74.6 kg)    Body mass index is 31.67 kg/m. Patient meets criteria for obesity based on current BMI. Skin WDL at this time.  Patient admitted following a fall with associated L hip fx. Plan for surgery for the same today, per H&P yesterday. She has been NPO since admission and has been having some experiences of intermittent nausea.  Labs and medications reviewed.  No nutrition interventions warranted at this time. If nutrition issues arise, please consult RD.     Jarome Matin, MS, RD, LDN, [facility] Inpatient Clinical Dietitian Pager # [phone] After hours/weekend pager # [phone]

## 2017-01-12 NOTE — Discharge Instructions (Signed)
 Dr. Delorese Sellin Adult Hip & Knee Specialist [facility] [address] [place], [place] [address] [phone]   POSTOPERATIVE DIRECTIONS    Hip Rehabilitation, Guidelines Following Surgery   WEIGHT BEARING Weight bearing as tolerated with assist device (walker, cane, etc) as directed, use it as long as suggested by your surgeon or therapist, typically at least 4-6 weeks.   HOME CARE INSTRUCTIONS  Remove items at home which could result in a fall. This includes throw rugs or furniture in walking pathways.  Continue medications as instructed at time of discharge.  You may have some home medications which will be placed on hold until you complete the course of blood thinner medication.  4 days after discharge, you may start showering. No tub baths or soaking your incisions. Do not put on socks or shoes without following the instructions of your caregivers.   Sit on chairs with arms. Use the chair arms to help push yourself up when arising.  Arrange for the use of a toilet seat elevator so you are not sitting low.   Walk with walker as instructed.  You may resume a sexual relationship in one month or when given the OK by your caregiver.  Use walker as long as suggested by your caregivers.  Avoid periods of inactivity such as sitting longer than an hour when not asleep. This helps prevent blood clots.  You may return to work once you are cleared by your surgeon.  Do not drive a car for 6 weeks or until released by your surgeon.  Do not drive while taking narcotics.  Wear elastic stockings for two weeks following surgery during the day but you may remove then at night.  Make sure you keep all of your appointments after your operation with all of your doctors and caregivers. You should call the office at the above phone number and make an appointment for approximately two weeks after the date of your surgery. Please pick up a stool softener and laxative  for home use as long as you are requiring pain medications.  ICE to the affected hip every three hours for 30 minutes at a time and then as needed for pain and swelling. Continue to use ice on the hip for pain and swelling from surgery. You may notice swelling that will progress down to the foot and ankle.  This is normal after surgery.  Elevate the leg when you are not up walking on it.   It is important for you to complete the blood thinner medication as prescribed by your doctor.  Continue to use the breathing machine which will help keep your temperature down.  It is common for your temperature to cycle up and down following surgery, especially at night when you are not up moving around and exerting yourself.  The breathing machine keeps your lungs expanded and your temperature down.  RANGE OF MOTION AND STRENGTHENING EXERCISES  These exercises are designed to help you keep full movement of your hip joint. Follow your caregiver's or physical therapist's instructions. Perform all exercises about fifteen times, three times per day or as directed. Exercise both hips, even if you have had only one joint replacement. These exercises can be done on a training (exercise) mat, on the floor, on a table or on a bed. Use whatever works the best and is most comfortable for you. Use music or television while you are exercising so that the exercises are a pleasant break in your day. This   will make your life better with the exercises acting as a break in routine you can look forward to.  Lying on your back, slowly slide your foot toward your buttocks, raising your knee up off the floor. Then slowly slide your foot back down until your leg is straight again.  Lying on your back spread your legs as far apart as you can without causing discomfort.  Lying on your side, raise your upper leg and foot straight up from the floor as far as is comfortable. Slowly lower the leg and repeat.  Lying on your back, tighten up the  muscle in the front of your thigh (quadriceps muscles). You can do this by keeping your leg straight and trying to raise your heel off the floor. This helps strengthen the largest muscle supporting your knee.  Lying on your back, tighten up the muscles of your buttocks both with the legs straight and with the knee bent at a comfortable angle while keeping your heel on the floor.   SKILLED REHAB INSTRUCTIONS: If the patient is transferred to a skilled rehab facility following release from the hospital, a list of the current medications will be sent to the facility for the patient to continue.  When discharged from the skilled rehab facility, please have the facility set up the patient's Home Health Physical Therapy prior to being released. Also, the skilled facility will be responsible for providing the patient with their medications at time of release from the facility to include their pain medication and their blood thinner medication. If the patient is still at the rehab facility at time of the two week follow up appointment, the skilled rehab facility will also need to assist the patient in arranging follow up appointment in our office and any transportation needs.  MAKE SURE YOU:  Understand these instructions.  Will watch your condition.  Will get help right away if you are not doing well or get worse.  Pick up stool softner and laxative for home use following surgery while on pain medications. Daily dry dressing changes as needed. In 4 days, you may remove your dressings and begin taking showers - no tub baths or soaking the incisions. Continue to use ice for pain and swelling after surgery. Do not use any lotions or creams on the incision until instructed by your surgeon.   

## 2017-01-12 NOTE — Progress Notes (Signed)
PROGRESS NOTE    Terri Liu  ONG:[id number] DOB: June 10, 1934 DOA: 01/11/2017 PCP: Antony Blackbird, MD   Chief Complaint  Patient presents with  . Fall  . Hip Pain    Brief Narrative:  HPI on 01/11/2017 by Dr. Linna Liu is a 83 y.o. female with medical history significant of HTN.  Patient presents to the ED following mechanical fall earlier today.  L hip pain and inability to weight bear.  Pain worse with movement, better at rest.  Pain severe. Assessment & Plan   Left intratrochanteric femur fracture -S/p fall -Orthopedics consulted and appreciated, plan for surgery today -Continue NPO status -Continue IVF and pain control -Will consult PT/OT after surgery  Essential hypertension -Continue Avapro, metoprolol, HCTZ  Mild dehydration with hyponatremia/hypochloremia -Placed on IVF -Continue to monitor BMP  Leukocytosis -Resolved, likely reactive -CXR unremarkable for infection, no urinary complaints  History of left total knee arthroplasty with contusion -Status post fall -Knee xray showed no acute abnormality -Continue pain control and Ice  Hyperlipidemia -Continue Statin  DVT Prophylaxis  SCDs  Code Status: Full  Family Communication: Daughter at bedside  Disposition Plan: Admitted. Pending surgery  Consultants Orthopedics  Procedures  None  Antibiotics   Anti-infectives    Start     Dose/Rate Route Frequency Ordered Stop   01/12/17 0600  ceFAZolin (ANCEF) IVPB 2g/100 mL premix     2 g 200 mL/hr over 30 Minutes Intravenous On call to O.R. 01/11/17 2222 01/13/17 0559      Subjective:   Terri Liu seen and examined today.  Patient complains of continued left hip and knee pain. Denies chest pain, shortness of breath, abdominal pain, N/V/D/C.  Objective:   Vitals:   01/11/17 2100 01/11/17 2200 01/12/17 0000 01/12/17 0527  BP: (!) 171/92 (!) 143/78  (!) 121/59  Pulse: 98 90  67  Resp: 18 (!) 21  16  Temp:    98.5 F (36.9 C)    TempSrc:    Oral  SpO2: 93% 93%  95%  Weight:   81.1 kg (178 lb 12.7 oz)   Height:   5\' 3"  (1.6 m)     Intake/Output Summary (Last 24 hours) at 01/12/17 1241 Last data filed at 01/12/17 0804  Gross per 24 hour  Intake                0 ml  Output              250 ml  Net             -250 ml   Filed Weights   01/12/17 0000  Weight: 81.1 kg (178 lb 12.7 oz)    Exam  General: Well developed, well nourished, NAD, appears stated age  HEENT: NCAT, PERRLA, EOMI, Anicteic Sclera, mucous membranes moist.   Cardiovascular: S1 S2 auscultated, no rubs, murmurs or gallops. Regular rate and rhythm.  Respiratory: Clear to auscultation bilaterally with equal chest rise  Abdomen: Soft, nontender, nondistended, + bowel sounds  Extremities: warm dry without cyanosis clubbing or edema. Small abrasion and hematoma on left knee. LLE shortened and rotated.   Neuro: AAOx3, nonfocal  Skin: Without rashes exudates or nodules  Psych: Normal affect and demeanor with intact judgement and insight   Data Reviewed: I have personally reviewed following labs and imaging studies  CBC:  Recent Labs Lab 01/11/17 2104 01/12/17 0758  WBC 15.8* 8.7  NEUTROABS 13.7*  --   HGB 13.7 12.9  HCT  37.0 36.4  MCV 90.2 91.2  PLT 215 426   Basic Metabolic Panel:  Recent Labs Lab 01/11/17 2104 01/12/17 0758  NA 126* 128*  K 3.8 3.8  CL 91* 90*  CO2 27 31  GLUCOSE 120* 110*  BUN 12 9  CREATININE 0.50 0.51  CALCIUM 8.9 8.9   GFR: Estimated Creatinine Clearance: 56.6 mL/min (by C-G formula based on SCr of 0.51 mg/dL). Liver Function Tests: No results for input(s): AST, ALT, ALKPHOS, BILITOT, PROT, ALBUMIN in the last 168 hours. No results for input(s): LIPASE, AMYLASE in the last 168 hours. No results for input(s): AMMONIA in the last 168 hours. Coagulation Profile:  Recent Labs Lab 01/11/17 2104  INR 0.97   Cardiac Enzymes: No results for input(s): CKTOTAL, CKMB, CKMBINDEX, TROPONINI in  the last 168 hours. BNP (last 3 results) No results for input(s): PROBNP in the last 8760 hours. HbA1C: No results for input(s): HGBA1C in the last 72 hours. CBG: No results for input(s): GLUCAP in the last 168 hours. Lipid Profile: No results for input(s): CHOL, HDL, LDLCALC, TRIG, CHOLHDL, LDLDIRECT in the last 72 hours. Thyroid Function Tests: No results for input(s): TSH, T4TOTAL, FREET4, T3FREE, THYROIDAB in the last 72 hours. Anemia Panel: No results for input(s): VITAMINB12, FOLATE, FERRITIN, TIBC, IRON, RETICCTPCT in the last 72 hours. Urine analysis:    Component Value Date/Time   COLORURINE YELLOW 12/09/2015 1145   APPEARANCEUR CLEAR 12/09/2015 1145   LABSPEC 1.008 12/09/2015 1145   PHURINE 7.5 12/09/2015 1145   GLUCOSEU NEGATIVE 12/09/2015 1145   HGBUR NEGATIVE 12/09/2015 1145   BILIRUBINUR NEGATIVE 12/09/2015 1145   KETONESUR NEGATIVE 12/09/2015 1145   PROTEINUR NEGATIVE 12/09/2015 1145   UROBILINOGEN 0.2 05/26/2008 1130   NITRITE NEGATIVE 12/09/2015 1145   LEUKOCYTESUR SMALL (A) 12/09/2015 1145   Sepsis Labs: @LABRCNTIP (procalcitonin:4,lacticidven:4)  ) Recent Results (from the past 240 hour(s))  Surgical pcr screen     Status: Abnormal   Collection Time: 01/12/17 12:46 AM  Result Value Ref Range Status   MRSA, PCR NEGATIVE NEGATIVE Final   Staphylococcus aureus POSITIVE (A) NEGATIVE Final    Comment:        The Xpert SA Assay (FDA approved for NASAL specimens in patients over 50 years of age), is one component of a comprehensive surveillance program.  Test performance has been validated by [facility] for patients greater than or equal to 22 year old. It is not intended to diagnose infection nor to guide or monitor treatment.       Radiology Studies: Dg Knee 2 Views Left  Result Date: 01/11/2017 CLINICAL DATA:  Recent fall with left knee pain, initial encounter EXAM: LEFT KNEE - 1-2 VIEW COMPARISON:  12/17/2015 FINDINGS: Left knee prosthesis is  noted. No acute bony abnormality is seen. Calcified loose body is noted posteriorly. IMPRESSION: No acute abnormality noted. Electronically Signed   By: Inez Catalina M.D.   On: 01/11/2017 20:14   Dg Chest Port 1 View  Result Date: 01/11/2017 CLINICAL DATA:  Preoperative evaluation for left hip fracture EXAM: PORTABLE CHEST 1 VIEW COMPARISON:  02/11/2008 FINDINGS: Cardiac shadow is within normal limits. Elevation right hemidiaphragm is again seen. The lungs are clear. No bony abnormality is noted. IMPRESSION: No active disease. Electronically Signed   By: Inez Catalina M.D.   On: 01/11/2017 21:02   Dg Hip Unilat With Pelvis 2-3 Views Left  Result Date: 01/11/2017 CLINICAL DATA:  Fall today with left hip pain, initial encounter EXAM: DG HIP (WITH OR  WITHOUT PELVIS) 3V LEFT COMPARISON:  None. FINDINGS: Pelvic ring is intact. Intratrochanteric fracture with mild displacement is seen in the proximal left femur. Mild degenerative changes of the lumbar spine and hip joints are noted. IMPRESSION: Left intratrochanteric fracture. Electronically Signed   By: Inez Catalina M.D.   On: 01/11/2017 20:13     Scheduled Meds: . calcium carbonate  1,250 mg Oral Daily  . chlorhexidine  60 mL Topical Once  . cyanocobalamin  500 mcg Oral BID  . cycloSPORINE  1 drop Both Eyes BID  . hydrochlorothiazide  25 mg Oral QHS  . irbesartan  150 mg Oral Daily  . metoprolol tartrate  25 mg Oral BID  . OXcarbazepine  150 mg Oral BID  . povidone-iodine  2 application Topical Once  . pravastatin  40 mg Oral q1800  . prenatal multivitamin  1 tablet Oral q morning - 10a   Continuous Infusions: .  ceFAZolin (ANCEF) IV       LOS: 1 day   Time Spent in minutes   30 minutes  Quantez Schnyder D.O. on 01/12/2017 at 12:41 PM  Between 7am to 7pm - Pager - [phone]  After 7pm go to [email or web address] - password [email or web address]  And look for the night coverage person covering for me after hours  [facility] Office   [phone]

## 2017-01-12 NOTE — Anesthesia Postprocedure Evaluation (Signed)
Anesthesia Post Note  Patient: Terri Liu  Procedure(s) Performed: Procedure(s) (LRB): INTRAMEDULLARY (IM) NAIL FEMORAL LEFT (Left)  Patient location during evaluation: PACU Anesthesia Type: Spinal Level of consciousness: sedated and patient cooperative Pain management: pain level controlled Vital Signs Assessment: post-procedure vital signs reviewed and stable Respiratory status: spontaneous breathing Cardiovascular status: stable Anesthetic complications: no       Last Vitals:  Vitals:   01/12/17 2227 01/12/17 2230  BP: 128/65 (!) 135/58  Pulse: 85 87  Resp: 14 17  Temp:  37.2 C    Last Pain:  Vitals:   01/12/17 1706  TempSrc:   PainSc: [place]

## 2017-01-12 NOTE — Op Note (Signed)
OPERATIVE REPORT  SURGEON: Rod Can, MD   ASSISTANT: staff.  PREOPERATIVE DIAGNOSIS: Left intertrochanteric femur fracture.   POSTOPERATIVE DIAGNOSIS: Left intertrochanteric femur fracture.   PROCEDURE: Intramedullary fixation, Left femur.   IMPLANTS: DePuy Affixus Hip Fracture Nail, 11 x 180 mm, 130 degrees. 10.5 x 105 mm lag screw. 5 x 32 mm distal interlocking screw 1.  ANESTHESIA:  Spinal  ESTIMATED BLOOD LOSS: 100 mL.  ANTIBIOTICS: 2 g Ancef.  DRAINS: None.  COMPLICATIONS: None.   CONDITION: PACU - hemodynamically stable.Marland Kitchen   BRIEF CLINICAL NOTE: Terri Liu is a 83 y.o. female who presented with an intertrochanteric femur fracture. The patient was admitted to the hospitalist service and underwent perioperative risk stratification and medical optimization. The risks, benefits, and alternatives to the procedure were explained, and the patient elected to proceed.  PROCEDURE IN DETAIL: Surgical site was marked by myself. The patient was taken to the operating room and spinal anesthesia was induced on the bed. The patient was then transferred to the [facility] table and the nonoperative lower extremity was scissored underneath the operative side. The fracture was reduced with traction, internal rotation, and adduction. The hip was prepped and draped in the normal sterile surgical fashion. Timeout was called verifying side and site of surgery. Preop antibiotics were given with 60 minutes of beginning the procedure.  Fluoroscopy was used to define the patient's anatomy. A 4 cm incision was made just proximal to the tip of the greater trochanter. The awl was used to obtain the standard starting point for a trochanteric entry nail under fluoroscopic control. The guidepin was placed. The entry reamer was used to open the proximal femur.  On the back table, the nail was assembled onto the jig. The nail was placed into the femur without any difficulty. Through a separate stab incision,  the cannula was placed down to the bone in preparation for the cephalomedullary device. A guidepin was placed into the femoral head using AP and lateral fluoroscopy views. The pin was measured, and then reaming was performed to the appropriate depth. The lag screw was inserted to the appropriate depth. The fracture was compressed through the jig. The setscrew was tightened and then loosened one quarter turn. A separate stab incision was created, and the distal interlocking screw was placed using standard AO technique. The jig was removed. Final AP and lateral fluoroscopy views were obtained to confirm fracture reduction and hardware placement. Tip apex distance was appropriate. There was no chondral penetration.  The wounds were copiously irrigated with saline. The wound was closed in layers with #1 Vicryl for the fascia, 2-0 Monocryl for the deep dermal layer, and 3-0 Monocryl subcuticular stitch. Glue was applied to the skin. Once the glue was fully hardened, sterile dressing was applied. The patient was then awakened from anesthesia and taken to the PACU in stable condition. Sponge needle and instrument counts were correct at the end of the case 2. There were no known complications.  We will readmit the patient to the hospitalist. Weightbearing status will be weightbearing as tolerated with a walker. We will begin aspirin for DVT prophylaxis. The patient will work with physical therapy and undergo disposition planning.

## 2017-01-12 NOTE — Anesthesia Procedure Notes (Signed)
Spinal  Patient location during procedure: OR End time: 01/12/2017 8:13 PM Staffing Resident/CRNA: Noralyn Pick D Performed: resident/CRNA  Preanesthetic Checklist Completed: patient identified, site marked, surgical consent, pre-op evaluation, timeout performed, IV checked, risks and benefits discussed and monitors and equipment checked Spinal Block Patient position: left lateral decubitus Prep: Betadine Patient monitoring: heart rate, continuous pulse ox and blood pressure Approach: midline Location: L3-4 Injection technique: single-shot Needle Needle type: Sprotte  Needle gauge: 24 G Needle length: 9 cm Assessment Sensory level: T6 Additional Notes Expiration date of kit checked and confirmed. Patient tolerated procedure well, without complications.

## 2017-01-13 ENCOUNTER — Encounter (HOSPITAL_COMMUNITY): Payer: Self-pay | Admitting: Orthopedic Surgery

## 2017-01-13 ENCOUNTER — Inpatient Hospital Stay (HOSPITAL_COMMUNITY): Payer: PPO

## 2017-01-13 DIAGNOSIS — R52 Pain, unspecified: Secondary | ICD-10-CM

## 2017-01-13 LAB — CBC
HCT: 33 % — ABNORMAL LOW (ref 36.0–46.0)
HEMOGLOBIN: 11.5 g/dL — AB (ref 12.0–15.0)
MCH: 32.2 pg (ref 26.0–34.0)
MCHC: 34.8 g/dL (ref 30.0–36.0)
MCV: 92.4 fL (ref 78.0–100.0)
PLATELETS: 145 10*3/uL — AB (ref 150–400)
RBC: 3.57 MIL/uL — AB (ref 3.87–5.11)
RDW: 12.4 % (ref 11.5–15.5)
WBC: 9.2 10*3/uL (ref 4.0–10.5)

## 2017-01-13 LAB — BASIC METABOLIC PANEL
Anion gap: 9 (ref 5–15)
BUN: 9 mg/dL (ref 6–20)
CHLORIDE: 90 mmol/L — AB (ref 101–111)
CO2: 28 mmol/L (ref 22–32)
CREATININE: 0.51 mg/dL (ref 0.44–1.00)
Calcium: 8.5 mg/dL — ABNORMAL LOW (ref 8.9–10.3)
Glucose, Bld: 138 mg/dL — ABNORMAL HIGH (ref 65–99)
POTASSIUM: 3.8 mmol/L (ref 3.5–5.1)
SODIUM: 127 mmol/L — AB (ref 135–145)

## 2017-01-13 MED ORDER — MUPIROCIN 2 % EX OINT
TOPICAL_OINTMENT | Freq: Two times a day (BID) | CUTANEOUS | Status: DC
  Administered 2017-01-13: 1 via NASAL
  Administered 2017-01-14: 10:00:00 via NASAL
  Filled 2017-01-13: qty 22

## 2017-01-13 MED ORDER — HYDROCODONE-ACETAMINOPHEN 5-325 MG PO TABS: 1.0000 | ORAL_TABLET | Freq: Four times a day (QID) | ORAL | 0 refills | Status: DC | PRN

## 2017-01-13 MED ORDER — ASPIRIN 81 MG PO TBEC: 81.0000 mg | DELAYED_RELEASE_TABLET | Freq: Two times a day (BID) | ORAL | 1 refills | Status: DC

## 2017-01-13 MED ORDER — SODIUM CHLORIDE 0.9 % IV SOLN
INTRAVENOUS | Status: DC
  Administered 2017-01-13: 75 mL/h via INTRAVENOUS

## 2017-01-13 NOTE — Progress Notes (Signed)
PROGRESS NOTE    Terri Liu  HQP:[id number] DOB: Jun 20, 1934 DOA: 01/11/2017 PCP: Antony Blackbird, MD   Chief Complaint  Patient presents with  . Fall  . Hip Pain    Brief Narrative:  HPI on 01/11/2017 by Dr. [name] is a 82 y.o. female with medical history significant of HTN.  Patient presents to the ED following mechanical fall earlier today.  L hip pain and inability to weight bear.  Pain worse with movement, better at rest.  Pain severe.  Interim history s/p Left femur intramedullary fixation Assessment & Plan   Left intratrochanteric femur fracture -S/p fall -Orthopedics consulted and appreciated, s/p Left femur intramedullary fixation -PT/OT consulted  Essential hypertension -Continue Avapro, metoprolol -held HCTZ  Mild dehydration with hyponatremia/hypochloremia -Placed on IVF -Continue to monitor BMP  Leukocytosis -Resolved, likely reactive -CXR unremarkable for infection, no urinary complaints  History of left total knee arthroplasty with contusion -Status post fall -Knee xray showed no acute abnormality -Continue pain control and Ice  Hyperlipidemia -Continue Statin  Left shoulder pain -Suspect secondary to fall -Obtained x-ray showing no acute abnormality. Osteoarthritis  DVT Prophylaxis  SCDs  Code Status: Full  Family Communication: Daughter at bedside  Disposition Plan: Admitted. Pending PT, OT. Likely discharge to SNF on 01/14/2017  Consultants Orthopedics  Procedures  Left femur intramedullary fixation  Antibiotics   Anti-infectives    Start     Dose/Rate Route Frequency Ordered Stop   01/13/17 0200  ceFAZolin (ANCEF) IVPB 2g/100 mL premix     2 g 200 mL/hr over 30 Minutes Intravenous Every 6 hours 01/12/17 2254 01/13/17 1045   01/12/17 1919  ceFAZolin (ANCEF) 2-4 GM/100ML-% IVPB    Comments:  [place], John   : cabinet override      01/12/17 1919 01/12/17 2030   01/12/17 0600  ceFAZolin (ANCEF) IVPB 2g/100 mL  premix     2 g 200 mL/hr over 30 Minutes Intravenous On call to O.R. 01/11/17 2222 01/12/17 2100      Subjective:   Terri Liu seen and examined today.  Patient complains of soreness in the left hip/leg. Denies further knee pain. Complains of left shoulder pain. Denies chest pain, shortness of breath, abdominal pain, N/V/D/C.  Objective:   Vitals:   01/13/17 0053 01/13/17 0154 01/13/17 0526 01/13/17 0931  BP: (!) 114/38 (!) 125/37 (!) 137/46 (!) 113/31  Pulse: 79 85 81 87  Resp: 16 16 16 16   Temp: 98.3 F (36.8 C) 99.2 F (37.3 C) 99.3 F (37.4 C) 98.6 F (37 C)  TempSrc: Oral Oral Oral Oral  SpO2: 95% 95% 96% 99%  Weight:      Height:        Intake/Output Summary (Last 24 hours) at 01/13/17 1305 Last data filed at 01/13/17 0932  Gross per 24 hour  Intake             2035 ml  Output             1400 ml  Net              635 ml   Filed Weights   01/12/17 0000  Weight: 81.1 kg (178 lb 12.7 oz)    Exam  General: Well developed, well nourished, NAD, appears stated age  HEENT: NCAT, mucous membranes moist.   Cardiovascular: S1 S2 auscultated, RRR, no murmurs  Respiratory: Clear to auscultation bilaterally with equal chest rise  Abdomen: Soft, nontender, nondistended, + bowel sounds  Extremities:  warm dry without cyanosis clubbing or edema. Small abrasion and hematoma on left knee. Bandage on left hip clean. No TTP of left shoulder, pain elicited with ROM  Neuro: AAOx3, nonfocal  Psych: Appropriate   Data Reviewed: I have personally reviewed following labs and imaging studies  CBC:  Recent Labs Lab 01/11/17 2104 01/12/17 0758 01/13/17 0557  WBC 15.8* 8.7 9.2  NEUTROABS 13.7*  --   --   HGB 13.7 12.9 11.5*  HCT 37.0 36.4 33.0*  MCV 90.2 91.2 92.4  PLT 215 192 920*   Basic Metabolic Panel:  Recent Labs Lab 01/11/17 2104 01/12/17 0758 01/13/17 0557  NA 126* 128* 127*  K 3.8 3.8 3.8  CL 91* 90* 90*  CO2 27 31 28   GLUCOSE 120* 110* 138*    BUN 12 9 9   CREATININE 0.50 0.51 0.51  CALCIUM 8.9 8.9 8.5*   GFR: Estimated Creatinine Clearance: 56.6 mL/min (by C-G formula based on SCr of 0.51 mg/dL). Liver Function Tests: No results for input(s): AST, ALT, ALKPHOS, BILITOT, PROT, ALBUMIN in the last 168 hours. No results for input(s): LIPASE, AMYLASE in the last 168 hours. No results for input(s): AMMONIA in the last 168 hours. Coagulation Profile:  Recent Labs Lab 01/11/17 2104  INR 0.97   Cardiac Enzymes: No results for input(s): CKTOTAL, CKMB, CKMBINDEX, TROPONINI in the last 168 hours. BNP (last 3 results) No results for input(s): PROBNP in the last 8760 hours. HbA1C: No results for input(s): HGBA1C in the last 72 hours. CBG: No results for input(s): GLUCAP in the last 168 hours. Lipid Profile: No results for input(s): CHOL, HDL, LDLCALC, TRIG, CHOLHDL, LDLDIRECT in the last 72 hours. Thyroid Function Tests: No results for input(s): TSH, T4TOTAL, FREET4, T3FREE, THYROIDAB in the last 72 hours. Anemia Panel: No results for input(s): VITAMINB12, FOLATE, FERRITIN, TIBC, IRON, RETICCTPCT in the last 72 hours. Urine analysis:    Component Value Date/Time   COLORURINE YELLOW 12/09/2015 1145   APPEARANCEUR CLEAR 12/09/2015 1145   LABSPEC 1.008 12/09/2015 1145   PHURINE 7.5 12/09/2015 1145   GLUCOSEU NEGATIVE 12/09/2015 1145   HGBUR NEGATIVE 12/09/2015 1145   BILIRUBINUR NEGATIVE 12/09/2015 1145   KETONESUR NEGATIVE 12/09/2015 1145   PROTEINUR NEGATIVE 12/09/2015 1145   UROBILINOGEN 0.2 05/26/2008 1130   NITRITE NEGATIVE 12/09/2015 1145   LEUKOCYTESUR SMALL (A) 12/09/2015 1145   Sepsis Labs: @LABRCNTIP (procalcitonin:4,lacticidven:4)  ) Recent Results (from the past 240 hour(s))  Surgical pcr screen     Status: Abnormal   Collection Time: 01/12/17 12:46 AM  Result Value Ref Range Status   MRSA, PCR NEGATIVE NEGATIVE Final   Staphylococcus aureus POSITIVE (A) NEGATIVE Final    Comment:        The Xpert SA  Assay (FDA approved for NASAL specimens in patients over 56 years of age), is one component of a comprehensive surveillance program.  Test performance has been validated by [facility] for patients greater than or equal to 47 year old. It is not intended to diagnose infection nor to guide or monitor treatment.       Radiology Studies: Dg Knee 2 Views Left  Result Date: 01/11/2017 CLINICAL DATA:  Recent fall with left knee pain, initial encounter EXAM: LEFT KNEE - 1-2 VIEW COMPARISON:  12/17/2015 FINDINGS: Left knee prosthesis is noted. No acute bony abnormality is seen. Calcified loose body is noted posteriorly. IMPRESSION: No acute abnormality noted. Electronically Signed   By: Inez Catalina M.D.   On: 01/11/2017 20:14   Pelvis  Portable  Result Date: 01/12/2017 CLINICAL DATA:  Left hip fracture EXAM: PORTABLE PELVIS 1-2 VIEWS COMPARISON:  01/11/2017 FINDINGS: There is dynamic hip screw fixation of the intertrochanteric left hip fracture. Good alignment. No immediate complication. IMPRESSION: ORIF Electronically Signed   By: Andreas Newport M.D.   On: 01/12/2017 22:13   Dg Chest Port 1 View  Result Date: 01/11/2017 CLINICAL DATA:  Preoperative evaluation for left hip fracture EXAM: PORTABLE CHEST 1 VIEW COMPARISON:  02/11/2008 FINDINGS: Cardiac shadow is within normal limits. Elevation right hemidiaphragm is again seen. The lungs are clear. No bony abnormality is noted. IMPRESSION: No active disease. Electronically Signed   By: Inez Catalina M.D.   On: 01/11/2017 21:02   Dg Shoulder Left  Result Date: 01/13/2017 CLINICAL DATA:  Left shoulder pain since a fall 2 days ago. Initial encounter. EXAM: LEFT SHOULDER - 2+ VIEW COMPARISON:  None. FINDINGS: No acute bony or joint abnormality is identified. Moderate to moderately severe acromioclavicular and glenohumeral osteoarthritis is seen. Imaged ribs and lungs appear normal. IMPRESSION: No acute abnormality. Acromioclavicular and glenohumeral  osteoarthritis. Electronically Signed   By: Inge Rise M.D.   On: 01/13/2017 11:03   Dg C-arm 1-60 Min-no Report  Result Date: 01/12/2017 Fluoroscopy was utilized by the requesting physician.  No radiographic interpretation.   Dg Hip Operative Unilat With Pelvis Left  Result Date: 01/12/2017 CLINICAL DATA:  Left hip IM nail EXAM: OPERATIVE left HIP (WITH PELVIS IF PERFORMED) 4 VIEWS TECHNIQUE: Fluoroscopic spot image(s) were submitted for interpretation post-operatively. COMPARISON:  01/11/2017 FINDINGS: Total fluoroscopy time was 74.6 seconds. Initial images demonstrate intertrochanteric fracture. Subsequent images demonstrate intramedullary rod and screw fixation of the proximal left femur across intertrochanteric fracture. IMPRESSION: Intraoperative fluoroscopy provided during intramedullary fixation of proximal femur fracture Electronically Signed   By: Donavan Foil M.D.   On: 01/12/2017 22:27   Dg Hip Unilat With Pelvis 2-3 Views Left  Result Date: 01/11/2017 CLINICAL DATA:  Fall today with left hip pain, initial encounter EXAM: DG HIP (WITH OR WITHOUT PELVIS) 3V LEFT COMPARISON:  None. FINDINGS: Pelvic ring is intact. Intratrochanteric fracture with mild displacement is seen in the proximal left femur. Mild degenerative changes of the lumbar spine and hip joints are noted. IMPRESSION: Left intratrochanteric fracture. Electronically Signed   By: Inez Catalina M.D.   On: 01/11/2017 20:13     Scheduled Meds: . aspirin EC  81 mg Oral BID WC  . calcium carbonate  1,250 mg Oral Daily  . cyanocobalamin  500 mcg Oral BID  . cycloSPORINE  1 drop Both Eyes BID  . hydrochlorothiazide  25 mg Oral QHS  . irbesartan  150 mg Oral Daily  . metoprolol tartrate  25 mg Oral BID  . OXcarbazepine  150 mg Oral BID  . pravastatin  40 mg Oral q1800  . prenatal multivitamin  1 tablet Oral q morning - 10a   Continuous Infusions: . methocarbamol (ROBAXIN)  IV Stopped (01/13/17 0005)     LOS: 2 days    Time Spent in minutes   30 minutes  Lauran Romanski D.O. on 01/13/2017 at 1:05 PM  Between 7am to 7pm - Pager - [phone]  After 7pm go to [email or web address] - password [email or web address]  And look for the night coverage person covering for me after hours  [facility] Office  [phone]

## 2017-01-13 NOTE — Progress Notes (Signed)
   Subjective:  Patient reports pain as mild to moderate.  C/o left hip pain.  Objective:   VITALS:   Vitals:   01/13/17 0053 01/13/17 0154 01/13/17 0526 01/13/17 0931  BP: (!) 114/38 (!) 125/37 (!) 137/46 (!) 113/31  Pulse: 79 85 81 87  Resp: 16 16 16 16   Temp: 98.3 F (36.8 C) 99.2 F (37.3 C) 99.3 F (37.4 C) 98.6 F (37 C)  TempSrc: Oral Oral Oral Oral  SpO2: 95% 95% 96% 99%  Weight:      Height:        NAD ABD soft Sensation intact distally Intact pulses distally Dorsiflexion/Plantar flexion intact Incision: dressing C/D/I Compartment soft   Lab Results  Component Value Date   WBC 9.2 01/13/2017   HGB 11.5 (L) 01/13/2017   HCT 33.0 (L) 01/13/2017   MCV 92.4 01/13/2017   PLT 145 (L) 01/13/2017   BMET    Component Value Date/Time   NA 127 (L) 01/13/2017 0557   NA 131 (L) 10/03/2016 1400   K 3.8 01/13/2017 0557   CL 90 (L) 01/13/2017 0557   CO2 28 01/13/2017 0557   GLUCOSE 138 (H) 01/13/2017 0557   BUN 9 01/13/2017 0557   BUN 9 10/03/2016 1400   CREATININE 0.51 01/13/2017 0557   CALCIUM 8.5 (L) 01/13/2017 0557   GFRNONAA >60 01/13/2017 0557   GFRAA >60 01/13/2017 0557     Assessment/Plan: 1 Day Post-Op   Principal Problem:   Closed intertrochanteric fracture, left, initial encounter (HCC) Active Problems:   HYPERTENSION, BENIGN SYSTEMIC   Closed comminuted intertrochanteric fracture of left femur (HCC)   WBAT LLE with walker, no active abduction for 6 weeks DVT ppx: ASA 81 mg PO BID for 6 weeks, SCDs, TEDs PT/OT PO pain control Dispo: D/C to SNF tomorrow   Terri Liu, Horald Pollen 01/13/2017, 10:04 AM   Rod Can, MD Cell [phone]

## 2017-01-13 NOTE — Progress Notes (Signed)
Physical Therapy Treatment Patient Details Name: Terri Liu MRN: [id number] DOB: Jun 14, 1934 Today's Date: 01/13/2017    History of Present Illness Pt with L intertrochanteric femur fracture after mechanical fall at home, now s/p Bil femoral intramedullary nail. PMHx includes HTN, R TKA, L TKA     PT Comments    Pt continues cooperative but limited by pain and fatigue and requiring increased time for completion of all mobility tasks.  Follow Up Recommendations  SNF     Equipment Recommendations  None recommended by PT    Recommendations for Other Services OT consult     Precautions / Restrictions Precautions Precautions: Fall Restrictions Weight Bearing Restrictions: No LLE Weight Bearing: Weight bearing as tolerated    Mobility  Bed Mobility Overal bed mobility: Needs Assistance Bed Mobility: Sit to Supine     Supine to sit: +2 for physical assistance;+2 for safety/equipment;Mod assist;Max assist Sit to supine: Max assist;+2 for physical assistance;+2 for safety/equipment   General bed mobility comments: Increased time, cues for sequence and use of R LE to self assist and use of pad to complete transition  Transfers Overall transfer level: Needs assistance Equipment used: Rolling walker (2 wheeled) Transfers: Sit to/from Stand Sit to Stand: Mod assist;+2 physical assistance;+2 safety/equipment;From elevated surface Stand pivot transfers: Mod assist;+2 physical assistance;From elevated surface       General transfer comment: Increased time with cues for LE management and use of UEs to self assist  Ambulation/Gait             General Gait Details: Stand pvt on R LE only.  Pt unable to WB sufficiently on L LE/UEs to step with R LE   Stairs            Wheelchair Mobility    Modified Rankin (Stroke Patients Only)       Balance Overall balance assessment: Needs assistance Sitting-balance support: Single extremity supported;Feet  supported Sitting balance-Leahy Scale: Fair     Standing balance support: Bilateral upper extremity supported Standing balance-Leahy Scale: Poor Standing balance comment: requires UE support                            Cognition Arousal/Alertness: Awake/alert Behavior During Therapy: WFL for tasks assessed/performed Overall Cognitive Status: Within Functional Limits for tasks assessed                                        Exercises Total Joint Exercises Ankle Circles/Pumps: AROM;Both;15 reps;Supine    General Comments        Pertinent Vitals/Pain Pain Assessment: Faces Faces Pain Scale: Hurts whole lot Pain Location: L hip  Pain Descriptors / Indicators: Grimacing;Guarding;Sore;Aching Pain Intervention(s): Limited activity within patient's tolerance;Monitored during session;Premedicated before session;Ice applied    Home Living Family/patient expects to be discharged to:: Skilled nursing facility Living Arrangements: Spouse/significant other   Type of Home: House     Home Layout: One level Home Equipment: Shower seat      Prior Function Level of Independence: Independent          PT Goals (current goals can now be found in the care plan section) Acute Rehab PT Goals Patient Stated Goal: to regain her independence  PT Goal Formulation: With patient Time For Goal Achievement: 01/17/17 Potential to Achieve Goals: Fair Progress towards PT goals: Progressing toward goals    Frequency  Min 3X/week      PT Plan Current plan remains appropriate    Co-evaluation PT/OT/SLP Co-Evaluation/Treatment: Yes Reason for Co-Treatment: For patient/therapist safety PT goals addressed during session: Mobility/safety with mobility OT goals addressed during session: ADL's and self-care      AM-PAC PT "6 Clicks" Daily Activity  Outcome Measure  Difficulty turning over in bed (including adjusting bedclothes, sheets and blankets)?: A  Lot Difficulty moving from lying on back to sitting on the side of the bed? : A Lot Difficulty sitting down on and standing up from a chair with arms (e.g., wheelchair, bedside commode, etc,.)?: A Lot Help needed moving to and from a bed to chair (including a wheelchair)?: A Lot Help needed walking in hospital room?: Total Help needed climbing 3-5 steps with a railing? : Total 6 Click Score: 10    End of Session Equipment Utilized During Treatment: Gait belt Activity Tolerance: Patient limited by pain;Patient limited by fatigue Patient left: with call bell/phone within reach;with family/visitor present;in bed Nurse Communication: Mobility status PT Visit Diagnosis: Unsteadiness on feet (R26.81);Difficulty in walking, not elsewhere classified (R26.2);History of falling (Z91.81)     Time: 6825-7493 PT Time Calculation (min) (ACUTE ONLY): 23 min  Charges:  $Therapeutic Activity: 23-37 mins                    G Codes:       Pg [phone]    Jenah Vanasten 01/13/2017, 5:26 PM

## 2017-01-13 NOTE — Evaluation (Signed)
Occupational Therapy Evaluation Patient Details Name: Terri Liu MRN: [id number] DOB: 03/13/1934 Today's Date: 01/13/2017    History of Present Illness Pt with L intertrochanteric femur fracture after mechanical fall at home, now s/p L femoral intramedullary nail. PMHx includes HTN, R TKA, L TKA    Clinical Impression   Pt is a 83 y/o F who presents with the above. Pt presents below baseline with ADLs and functional mobility with greatest deficits in LB dressing and LB bathing, limited mostly by pain this session. Pt will benefit from continued acute OT services and post acute OT services to increase safety and independence with ADLs and functional mobility. Goals are for MinA +2 for functional mobility and supervision for seated ADLs.     Follow Up Recommendations  SNF    Equipment Recommendations  None recommended by OT           Precautions / Restrictions Precautions Precautions: Fall Restrictions Weight Bearing Restrictions: No LLE Weight Bearing: Weight bearing as tolerated      Mobility Bed Mobility Overal bed mobility: Needs Assistance Bed Mobility: Supine to Sit     Supine to sit: Mod assist;+2 for physical assistance;+2 for safety/equipment        Transfers Overall transfer level: Needs assistance Equipment used: Rolling walker (2 wheeled) Transfers: Sit to/from Stand Sit to Stand: Mod assist;+2 physical assistance;+2 safety/equipment              Balance Overall balance assessment: Needs assistance Sitting-balance support: Single extremity supported;Feet supported Sitting balance-Leahy Scale: Fair     Standing balance support: Bilateral upper extremity supported Standing balance-Leahy Scale: Poor Standing balance comment: requires UE support                           ADL either performed or assessed with clinical judgement   ADL Overall ADL's : Needs assistance/impaired Eating/Feeding: Sitting;Independent   Grooming: Wash/dry  face;Supervision/safety;Sitting   Upper Body Bathing: Min guard;Sitting   Lower Body Bathing: Moderate assistance;+2 for physical assistance;+2 for safety/equipment;Sit to/from stand   Upper Body Dressing : Min guard;Sitting   Lower Body Dressing: Maximal assistance;+2 for physical assistance;+2 for safety/equipment;Sit to/from stand   Toilet Transfer: Moderate assistance;+2 for physical assistance;+2 for safety/equipment;Stand-pivot;BSC;RW   Toileting- Clothing Manipulation and Hygiene: Maximal assistance;+2 for physical assistance;+2 for safety/equipment;Sit to/from stand       Functional mobility during ADLs: Moderate assistance;+2 for physical assistance;+2 for safety/equipment;Rolling walker                           Pertinent Vitals/Pain Pain Assessment: Faces Faces Pain Scale: Hurts whole lot Pain Location: L hip  Pain Descriptors / Indicators: Grimacing;Guarding;Sore;Aching Pain Intervention(s): Limited activity within patient's tolerance;Monitored during session;Premedicated before session;Ice applied;Repositioned          Extremity/Trunk Assessment Upper Extremity Assessment Upper Extremity Assessment: Overall WFL for tasks assessed           Communication Communication Communication: No difficulties   Cognition Arousal/Alertness: Awake/alert Behavior During Therapy: WFL for tasks assessed/performed Overall Cognitive Status: Within Functional Limits for tasks assessed                                     General Comments                  Home Living Family/patient expects to  be discharged to:: Skilled nursing facility Living Arrangements: Spouse/significant other   Type of Home: House       Home Layout: One level     Bathroom Shower/Tub: Occupational psychologist: Handicapped height     Home Equipment: Shower seat          Prior Functioning/Environment Level of Independence: Independent                  OT Problem List: Decreased activity tolerance;Decreased strength;Pain      OT Treatment/Interventions: Self-care/ADL training;DME and/or AE instruction;Therapeutic exercise;Therapeutic activities;Patient/family education    OT Goals(Current goals can be found in the care plan section) Acute Rehab OT Goals Patient Stated Goal: to regain her independence  OT Goal Formulation: With patient Time For Goal Achievement: 01/20/17 Potential to Achieve Goals: Good ADL Goals Pt Will Perform Grooming: with set-up;sitting Pt Will Transfer to Toilet: with min assist;with +2 assist;stand pivot transfer;bedside commode Pt Will Perform Toileting - Clothing Manipulation and hygiene: with min assist;with 2+ total assist;sit to/from stand  OT Frequency: Min 2X/week               Co-evaluation PT/OT/SLP Co-Evaluation/Treatment: Yes Reason for Co-Treatment: For patient/therapist safety PT goals addressed during session: Mobility/safety with mobility OT goals addressed during session: ADL's and self-care      AM-PAC PT "6 Clicks" Daily Activity     Outcome Measure Help from another person eating meals?: None Help from another person taking care of personal grooming?: A Little Help from another person toileting, which includes using toliet, bedpan, or urinal?: A Lot Help from another person bathing (including washing, rinsing, drying)?: A Lot Help from another person to put on and taking off regular upper body clothing?: A Little Help from another person to put on and taking off regular lower body clothing?: A Lot 6 Click Score: 16   End of Session Equipment Utilized During Treatment: Gait belt;Rolling walker Nurse Communication: Mobility status  Activity Tolerance: Patient tolerated treatment well Patient left: in chair;with call bell/phone within reach;with family/visitor present  OT Visit Diagnosis: Pain;Unsteadiness on feet (R26.81) Pain - Right/Left: Left Pain - part of body:  Hip                Time: 1572-6203 OT Time Calculation (min): 44 min Charges:  OT Evaluation $OT Eval Moderate Complexity: 1 Procedure G-Codes:     Lou Cal, OT Pager [phone] 01/13/2017   Raymondo Band 01/13/2017, 2:54 PM

## 2017-01-13 NOTE — Evaluation (Signed)
Physical Therapy Evaluation Patient Details Name: Terri Liu MRN: [id number] DOB: [birth year] Today's Date: 01/13/2017   History of Present Illness  Pt with L intertrochanteric femur fracture after mechanical fall at home, now s/p Bil femoral intramedullary nail. PMHx includes HTN, R TKA, L TKA   Clinical Impression  Pt admitted as above and presenting with functional mobility limitations 2* decreased L LE strength/ROM and post op pain.  Pt would greatly benefit from follow up rehab at SNF level to maximize IND and safety prior to return home with ltd assist.    Follow Up Recommendations SNF    Equipment Recommendations  None recommended by PT    Recommendations for Other Services OT consult     Precautions / Restrictions Precautions Precautions: Fall Restrictions Weight Bearing Restrictions: No LLE Weight Bearing: Weight bearing as tolerated      Mobility  Bed Mobility Overal bed mobility: Needs Assistance Bed Mobility: Supine to Sit     Supine to sit: +2 for physical assistance;+2 for safety/equipment;Mod assist;Max assist     General bed mobility comments: Increased time, cues for sequence and use of R LE to self assist and use of pad to complete transition  Transfers Overall transfer level: Needs assistance Equipment used: Rolling walker (2 wheeled) Transfers: Sit to/from Stand Sit to Stand: Mod assist;+2 physical assistance;+2 safety/equipment;From elevated surface Stand pivot transfers: Mod assist;+2 physical assistance;From elevated surface       General transfer comment: Increased time with cues for LE management and use of UEs to self assist  Ambulation/Gait             General Gait Details: Stand pvt on R LE only.  Pt unable to WB sufficiently on L LE/UEs to step with R LE  Stairs            Wheelchair Mobility    Modified Rankin (Stroke Patients Only)       Balance Overall balance assessment: Needs assistance Sitting-balance  support: Single extremity supported;Feet supported Sitting balance-Leahy Scale: Fair     Standing balance support: Bilateral upper extremity supported Standing balance-Leahy Scale: Poor Standing balance comment: requires UE support                             Pertinent Vitals/Pain Pain Assessment: Faces Faces Pain Scale: Hurts whole lot Pain Location: L hip  Pain Descriptors / Indicators: Grimacing;Guarding;Sore;Aching Pain Intervention(s): Limited activity within patient's tolerance;Monitored during session;Premedicated before session;Ice applied    Home Living Family/patient expects to be discharged to:: Skilled nursing facility Living Arrangements: Spouse/significant other   Type of Home: House       Home Layout: One level Home Equipment: Shower seat      Prior Function Level of Independence: Independent               Hand Dominance   Dominant Hand: Right    Extremity/Trunk Assessment   Upper Extremity Assessment Upper Extremity Assessment: Overall WFL for tasks assessed    Lower Extremity Assessment Lower Extremity Assessment: LLE deficits/detail LLE: Unable to fully assess due to pain       Communication   Communication: No difficulties  Cognition Arousal/Alertness: Awake/alert Behavior During Therapy: WFL for tasks assessed/performed Overall Cognitive Status: Within Functional Limits for tasks assessed  General Comments      Exercises Total Joint Exercises Ankle Circles/Pumps: AROM;Both;15 reps;Supine   Assessment/Plan    PT Assessment Patient needs continued PT services  PT Problem List Decreased strength;Decreased range of motion;Decreased activity tolerance;Decreased balance;Decreased mobility;Decreased knowledge of use of DME;Pain       PT Treatment Interventions DME instruction;Gait training;Functional mobility training;Therapeutic activities;Therapeutic  exercise;Patient/family education    PT Goals (Current goals can be found in the Care Plan section)  Acute Rehab PT Goals Patient Stated Goal: to regain her independence  PT Goal Formulation: With patient Time For Goal Achievement: 01/17/17 Potential to Achieve Goals: Fair    Frequency Min 3X/week   Barriers to discharge        Co-evaluation PT/OT/SLP Co-Evaluation/Treatment: Yes Reason for Co-Treatment: For patient/therapist safety PT goals addressed during session: Mobility/safety with mobility OT goals addressed during session: ADL's and self-care       AM-PAC PT "6 Clicks" Daily Activity  Outcome Measure Difficulty turning over in bed (including adjusting bedclothes, sheets and blankets)?: A Lot Difficulty moving from lying on back to sitting on the side of the bed? : A Lot Difficulty sitting down on and standing up from a chair with arms (e.g., wheelchair, bedside commode, etc,.)?: A Lot Help needed moving to and from a bed to chair (including a wheelchair)?: A Lot Help needed walking in hospital room?: Total Help needed climbing 3-5 steps with a railing? : Total 6 Click Score: 10    End of Session Equipment Utilized During Treatment: Gait belt Activity Tolerance: Patient limited by pain;Patient limited by fatigue Patient left: in chair;with call bell/phone within reach;with family/visitor present Nurse Communication: Mobility status PT Visit Diagnosis: Unsteadiness on feet (R26.81);Difficulty in walking, not elsewhere classified (R26.2);History of falling (Z91.81)    Time: 7062-3762 PT Time Calculation (min) (ACUTE ONLY): 44 min   Charges:   PT Evaluation $PT Eval Low Complexity: 1 Procedure PT Treatments $Therapeutic Activity: 8-22 mins   PT G Codes:        Pg [phone]   Nysia Dell 01/13/2017, 5:19 PM

## 2017-01-13 NOTE — Progress Notes (Signed)
Plan for d/c to SNF, discharge planning per CSW. [phone] 

## 2017-01-14 DIAGNOSIS — S72012S Unspecified intracapsular fracture of left femur, sequela: Secondary | ICD-10-CM | POA: Diagnosis not present

## 2017-01-14 DIAGNOSIS — M6281 Muscle weakness (generalized): Secondary | ICD-10-CM | POA: Diagnosis not present

## 2017-01-14 DIAGNOSIS — E871 Hypo-osmolality and hyponatremia: Secondary | ICD-10-CM | POA: Diagnosis not present

## 2017-01-14 DIAGNOSIS — Z4789 Encounter for other orthopedic aftercare: Secondary | ICD-10-CM | POA: Diagnosis not present

## 2017-01-14 DIAGNOSIS — D72829 Elevated white blood cell count, unspecified: Secondary | ICD-10-CM | POA: Diagnosis not present

## 2017-01-14 DIAGNOSIS — S79911A Unspecified injury of right hip, initial encounter: Secondary | ICD-10-CM | POA: Diagnosis not present

## 2017-01-14 DIAGNOSIS — M25512 Pain in left shoulder: Secondary | ICD-10-CM

## 2017-01-14 DIAGNOSIS — R2681 Unsteadiness on feet: Secondary | ICD-10-CM | POA: Diagnosis not present

## 2017-01-14 DIAGNOSIS — S728X9A Other fracture of unspecified femur, initial encounter for closed fracture: Secondary | ICD-10-CM | POA: Diagnosis not present

## 2017-01-14 DIAGNOSIS — E860 Dehydration: Secondary | ICD-10-CM | POA: Diagnosis not present

## 2017-01-14 DIAGNOSIS — R1312 Dysphagia, oropharyngeal phase: Secondary | ICD-10-CM | POA: Diagnosis not present

## 2017-01-14 DIAGNOSIS — S72142A Displaced intertrochanteric fracture of left femur, initial encounter for closed fracture: Secondary | ICD-10-CM | POA: Diagnosis not present

## 2017-01-14 DIAGNOSIS — S72002A Fracture of unspecified part of neck of left femur, initial encounter for closed fracture: Secondary | ICD-10-CM | POA: Diagnosis not present

## 2017-01-14 DIAGNOSIS — E878 Other disorders of electrolyte and fluid balance, not elsewhere classified: Secondary | ICD-10-CM | POA: Diagnosis not present

## 2017-01-14 DIAGNOSIS — E785 Hyperlipidemia, unspecified: Secondary | ICD-10-CM | POA: Diagnosis not present

## 2017-01-14 DIAGNOSIS — S72102S Unspecified trochanteric fracture of left femur, sequela: Secondary | ICD-10-CM | POA: Diagnosis not present

## 2017-01-14 DIAGNOSIS — I10 Essential (primary) hypertension: Secondary | ICD-10-CM | POA: Diagnosis not present

## 2017-01-14 DIAGNOSIS — R52 Pain, unspecified: Secondary | ICD-10-CM | POA: Diagnosis not present

## 2017-01-14 LAB — CBC
HCT: 29.1 % — ABNORMAL LOW (ref 36.0–46.0)
Hemoglobin: 10.2 g/dL — ABNORMAL LOW (ref 12.0–15.0)
MCH: 32.6 pg (ref 26.0–34.0)
MCHC: 35.1 g/dL (ref 30.0–36.0)
MCV: 93 fL (ref 78.0–100.0)
PLATELETS: 125 10*3/uL — AB (ref 150–400)
RBC: 3.13 MIL/uL — AB (ref 3.87–5.11)
RDW: 12.6 % (ref 11.5–15.5)
WBC: 7.1 10*3/uL (ref 4.0–10.5)

## 2017-01-14 LAB — BASIC METABOLIC PANEL
ANION GAP: 5 (ref 5–15)
Anion gap: 3 — ABNORMAL LOW (ref 5–15)
BUN: 10 mg/dL (ref 6–20)
BUN: 11 mg/dL (ref 6–20)
CALCIUM: 8.3 mg/dL — AB (ref 8.9–10.3)
CALCIUM: 8.2 mg/dL — AB (ref 8.9–10.3)
CO2: 32 mmol/L (ref 22–32)
CO2: 32 mmol/L (ref 22–32)
CREATININE: 0.5 mg/dL (ref 0.44–1.00)
Chloride: 92 mmol/L — ABNORMAL LOW (ref 101–111)
Chloride: 95 mmol/L — ABNORMAL LOW (ref 101–111)
Creatinine, Ser: 0.52 mg/dL (ref 0.44–1.00)
GFR calc Af Amer: >60 mL/min (ref >60)
GFR calc non Af Amer: >60 mL/min (ref >60)
GFR calc non Af Amer: >60 mL/min (ref >60)
GLUCOSE: 129 mg/dL — AB (ref 65–99)
Glucose, Bld: 106 mg/dL — ABNORMAL HIGH (ref 65–99)
POTASSIUM: 4.2 mmol/L (ref 3.5–5.1)
Potassium: 4.6 mmol/L (ref 3.5–5.1)
Sodium: 129 mmol/L — ABNORMAL LOW (ref 135–145)
Sodium: 130 mmol/L — ABNORMAL LOW (ref 135–145)

## 2017-01-14 MED ORDER — KETOROLAC TROMETHAMINE 15 MG/ML IJ SOLN
7.5000 mg | Freq: Four times a day (QID) | INTRAMUSCULAR | Status: AC
  Administered 2017-01-14 (×2): 7.5 mg via INTRAVENOUS
  Filled 2017-01-14 (×2): qty 1

## 2017-01-14 MED ORDER — KETOROLAC TROMETHAMINE 15 MG/ML IJ SOLN: 7.5000 mg | Freq: Once | INTRAMUSCULAR | Status: DC

## 2017-01-14 NOTE — Progress Notes (Signed)
Physical Therapy Treatment Patient Details Name: BRIER FIREBAUGH MRN: [id number] DOB: 03-03-1934 Today's Date: 01/14/2017    History of Present Illness Pt with L intertrochanteric femur fracture after mechanical fall at home, now s/p Bil femoral intramedullary nail. PMHx includes HTN, R TKA, L TKA     PT Comments    Marked improvement in activity tolerance with noted improvement in pain control.  Follow Up Recommendations  SNF     Equipment Recommendations  None recommended by PT    Recommendations for Other Services OT consult     Precautions / Restrictions Precautions Precautions: Fall Restrictions Weight Bearing Restrictions: No LLE Weight Bearing: Weight bearing as tolerated    Mobility  Bed Mobility Overal bed mobility: Needs Assistance Bed Mobility: Supine to Sit     Supine to sit: Min assist;Mod assist;+2 for physical assistance;+2 for safety/equipment     General bed mobility comments: Increased time, cues for sequence and use of R LE to self assist   Transfers Overall transfer level: Needs assistance Equipment used: Rolling walker (2 wheeled) Transfers: Sit to/from Stand Sit to Stand: Min assist;Mod assist;+2 physical assistance;+2 safety/equipment         General transfer comment: Increased time with cues for LE management and use of UEs to self assist  Ambulation/Gait Ambulation/Gait assistance: Min assist;Mod assist;+2 safety/equipment Ambulation Distance (Feet): 13 Feet Assistive device: Rolling walker (2 wheeled) Gait Pattern/deviations: Step-to pattern;Decreased step length - right;Decreased step length - left;Shuffle;Trunk flexed Gait velocity: decr Gait velocity interpretation: Below normal speed for age/gender General Gait Details: Incresased time with cues for sequence, posture, increased UE WB and position from [employer]            Wheelchair Mobility    Modified Rankin (Stroke Patients Only)       Balance Overall balance  assessment: Needs assistance Sitting-balance support: Feet supported;No upper extremity supported Sitting balance-Leahy Scale: Fair     Standing balance support: Bilateral upper extremity supported Standing balance-Leahy Scale: Poor Standing balance comment: requires UE support                            Cognition Arousal/Alertness: Awake/alert Behavior During Therapy: WFL for tasks assessed/performed Overall Cognitive Status: Within Functional Limits for tasks assessed                                        Exercises General Exercises - Lower Extremity Ankle Circles/Pumps: AROM;Both;15 reps;Supine Quad Sets: AROM;Both;10 reps;Supine Heel Slides: AAROM;Left;20 reps;Supine Hip ABduction/ADduction: AAROM;Left;15 reps;Supine    General Comments        Pertinent Vitals/Pain Pain Assessment: 0-10 Pain Score: 5  Pain Location: L hip  Pain Descriptors / Indicators: Aching;Sore Pain Intervention(s): Limited activity within patient's tolerance;Monitored during session;Premedicated before session;Ice applied    Home Living                      Prior Function            PT Goals (current goals can now be found in the care plan section) Acute Rehab PT Goals Patient Stated Goal: to regain her independence  PT Goal Formulation: With patient Time For Goal Achievement: 01/17/17 Potential to Achieve Goals: Fair Progress towards PT goals: Progressing toward goals    Frequency    Min 3X/week      PT  Plan Current plan remains appropriate    Co-evaluation              AM-PAC PT "6 Clicks" Daily Activity  Outcome Measure  Difficulty turning over in bed (including adjusting bedclothes, sheets and blankets)?: A Lot Difficulty moving from lying on back to sitting on the side of the bed? : A Lot Difficulty sitting down on and standing up from a chair with arms (e.g., wheelchair, bedside commode, etc,.)?: A Lot Help needed moving to  and from a bed to chair (including a wheelchair)?: A Lot Help needed walking in hospital room?: A Lot Help needed climbing 3-5 steps with a railing? : A Lot 6 Click Score: 12    End of Session Equipment Utilized During Treatment: Gait belt Activity Tolerance: Patient limited by pain;Patient limited by fatigue Patient left: in chair;with call bell/phone within reach;with family/visitor present Nurse Communication: Mobility status PT Visit Diagnosis: Unsteadiness on feet (R26.81);Difficulty in walking, not elsewhere classified (R26.2);History of falling (Z91.81)     Time: 1010-1055 PT Time Calculation (min) (ACUTE ONLY): 45 min  Charges:  $Gait Training: 23-37 mins $Therapeutic Exercise: 8-22 mins                    G Codes:       Pg [phone]    Juliza Machnik 01/14/2017, 2:24 PM

## 2017-01-14 NOTE — Progress Notes (Signed)
Physical Therapy Treatment Patient Details Name: Terri Liu MRN: [id number] DOB: [birth year] Today's Date: 01/14/2017    History of Present Illness Pt with L intertrochanteric femur fracture after mechanical fall at home, now s/p Bil femoral intramedullary nail. PMHx includes HTN, R TKA, L TKA     PT Comments    Pt continues cooperative and progressing with mobility but requiring increased time for all tasks and ltd by pain/fatigue.   Follow Up Recommendations  SNF     Equipment Recommendations  None recommended by PT    Recommendations for Other Services OT consult     Precautions / Restrictions Precautions Precautions: Fall Restrictions Weight Bearing Restrictions: No LLE Weight Bearing: Weight bearing as tolerated    Mobility  Bed Mobility Overal bed mobility: Needs Assistance Bed Mobility: Sit to Supine     Supine to sit: Min assist;Mod assist;+2 for physical assistance;+2 for safety/equipment Sit to supine: Mod assist;Max assist;+2 for physical assistance;+2 for safety/equipment   General bed mobility comments: Increased time, cues for sequence and use of R LE to self assist   Transfers Overall transfer level: Needs assistance Equipment used: Rolling walker (2 wheeled) Transfers: Sit to/from Stand Sit to Stand: Min assist;Mod assist;+2 physical assistance;+2 safety/equipment Stand pivot transfers: Mod assist;+2 physical assistance;From elevated surface       General transfer comment: Increased time with cues for LE management and use of UEs to self assist  Ambulation/Gait Ambulation/Gait assistance: Min assist;Mod assist;+2 safety/equipment Ambulation Distance (Feet): 4 Feet Assistive device: Rolling walker (2 wheeled) Gait Pattern/deviations: Step-to pattern;Decreased step length - right;Decreased step length - left;Shuffle;Trunk flexed Gait velocity: decr Gait velocity interpretation: Below normal speed for age/gender General Gait Details: Incresased  time with cues for sequence, posture, increased UE WB and position from [employer]            Wheelchair Mobility    Modified Rankin (Stroke Patients Only)       Balance Overall balance assessment: Needs assistance Sitting-balance support: Feet supported;No upper extremity supported Sitting balance-Leahy Scale: Fair     Standing balance support: Bilateral upper extremity supported Standing balance-Leahy Scale: Poor Standing balance comment: requires UE support                            Cognition Arousal/Alertness: Awake/alert Behavior During Therapy: WFL for tasks assessed/performed Overall Cognitive Status: Within Functional Limits for tasks assessed                                        Exercises General Exercises - Lower Extremity Ankle Circles/Pumps: AROM;Both;15 reps;Supine Quad Sets: AROM;Both;10 reps;Supine Heel Slides: AAROM;Left;20 reps;Supine Hip ABduction/ADduction: AAROM;Left;15 reps;Supine    General Comments        Pertinent Vitals/Pain Pain Assessment: 0-10 Pain Score: 6  Pain Location: L hip  Pain Descriptors / Indicators: Aching;Sore Pain Intervention(s): Limited activity within patient's tolerance;Monitored during session;Premedicated before session;Ice applied    Home Living                      Prior Function            PT Goals (current goals can now be found in the care plan section) Acute Rehab PT Goals Patient Stated Goal: to regain her independence  PT Goal Formulation: With patient Time For Goal Achievement: 01/17/17 Potential to Achieve  Goals: Fair Progress towards PT goals: Progressing toward goals    Frequency    Min 3X/week      PT Plan Current plan remains appropriate    Co-evaluation              AM-PAC PT "6 Clicks" Daily Activity  Outcome Measure  Difficulty turning over in bed (including adjusting bedclothes, sheets and blankets)?: A Lot Difficulty moving  from lying on back to sitting on the side of the bed? : A Lot Difficulty sitting down on and standing up from a chair with arms (e.g., wheelchair, bedside commode, etc,.)?: A Lot Help needed moving to and from a bed to chair (including a wheelchair)?: A Lot Help needed walking in hospital room?: A Lot Help needed climbing 3-5 steps with a railing? : A Lot 6 Click Score: 12    End of Session Equipment Utilized During Treatment: Gait belt Activity Tolerance: Patient limited by pain;Patient limited by fatigue;Patient tolerated treatment well Patient left: in bed;with call bell/phone within reach;with family/visitor present Nurse Communication: Mobility status PT Visit Diagnosis: Unsteadiness on feet (R26.81);Difficulty in walking, not elsewhere classified (R26.2);History of falling (Z91.81)     Time: 8882-8003 PT Time Calculation (min) (ACUTE ONLY): 24 min  Charges:  $Gait Training: 8-22 mins $Therapeutic Exercise: 8-22 mins $Therapeutic Activity: 8-22 mins                    G Codes:       Pg [phone]    Terri Liu 01/14/2017, 2:28 PM

## 2017-01-14 NOTE — Progress Notes (Signed)
Terri Liu from [facility] placed returned phone call.  Nurse gave report to [facility].

## 2017-01-14 NOTE — Progress Notes (Signed)
CSW received insurance authorization for SNF. Auth #: [id number]  Patient has a bed at [facility] once stable for discharge. Please contact CSW for any discharge planning questions/ concerns.   Kingsley Spittle, [place] Clinical Social Worker [phone]

## 2017-01-14 NOTE — Progress Notes (Signed)
     Subjective: 2 Days Post-Op Procedure(s) (LRB): INTRAMEDULLARY (IM) NAIL FEMORAL LEFT (Left)   Patient reports pain as mild, with regards to the hip.  She does have spikes of intense pain which are most likely sciatic pain.  She is concerned with the sciatic pain, but I will give her some toradol to see if this will help the inflammation of the area.  Orthopaedically she is stable.  Objective:   VITALS:   Vitals:   01/13/17 2113 01/14/17 0544  BP: (!) 111/44 123/63  Pulse: 75 80  Resp: 16 16  Temp: 98.8 F (37.1 C) 98.1 F (36.7 C)    Dorsiflexion/Plantar flexion intact Incision: dressing C/D/I No cellulitis present Compartment soft  LABS  Recent Labs  01/12/17 0758 01/13/17 0557 01/14/17 0538  HGB 12.9 11.5* 10.2*  HCT 36.4 33.0* 29.1*  WBC 8.7 9.2 7.1  PLT 192 145* 125*     Recent Labs  01/12/17 0758 01/13/17 0557 01/14/17 0538  NA 128* 127* 129*  K 3.8 3.8 4.2  BUN 9 9 10   CREATININE 0.51 0.51 0.52  GLUCOSE 110* 138* 106*     Assessment/Plan: 2 Days Post-Op Procedure(s) (LRB): INTRAMEDULLARY (IM) NAIL FEMORAL LEFT (Left) Toradol added Up with therapy Discharge to SNF Follow up as scheduled at [facility]. Follow up with Dr. Lyla Glassing in as scheduled.  Contact information:  [facility] [address] [place] Khris Jansson   PAC  01/14/2017, 8:03 AM

## 2017-01-14 NOTE — Discharge Summary (Signed)
Physician Discharge Summary  Terri Liu IRS:[id number] DOB: Oct 08, 1933 DOA: 01/11/2017  PCP: Antony Blackbird, MD  Admit date: 01/11/2017 Discharge date: 01/14/2017  Time spent: 45 minutes  Recommendations for Outpatient Follow-up:  Patient will be discharged to [facility], continue physical and occupational therapy.  Patient will need to follow up with primary care provider within one week of discharge, repeat BMP and discuss blood pressure management. Follow up with orthopedic surgeon, Dr. Delfino Lovett as scheduled.  Patient should continue medications as prescribed.  Patient should follow a heart healthy diet.   Discharge Diagnoses:  Left intratrochanteric femur fracture Essential hypertension Mild dehydration with hyponatremia/hypochloremia Leukocytosis History of left total knee arthroplasty with contusion Hyperlipidemia Left shoulder pain  Discharge Condition: Stable  Diet recommendation: heart healthy  Filed Weights   01/12/17 0000  Weight: 81.1 kg (178 lb 12.7 oz)    History of present illness:  on 01/11/2017 by Dr. Prudence Davidson L Coxis a 83 y.o.femalewith medical history significant of HTN. Patient presents to the ED following mechanical fall earlier today. L hip pain and inability to weight bear. Pain worse with movement, better at rest. Pain severe.  Hospital Course:  Left intratrochanteric femur fracture -S/p fall -Orthopedics consulted and appreciated, s/p Left femur intramedullary fixation -PT/OT consulted and recommended SNF -Social work consulted -Continue pain medication PRN -ortho placed patient on Aspirin 81mg  BID  Essential hypertension -Continue Avapro, metoprolol -held HCTZ- would follow up with PCP to see if this medication is still needed   Mild dehydration with hyponatremia/hypochloremia -Placed on IVF, held HCTZ -Sodium 130 -Repeat BMP in one week -Discussed intake with patient and family at bedside. Per family, patient only drinks  1 16oz soda a day, no water intake. Patient stated she was told to drink gatorade.  Leukocytosis -Resolved, likely reactive -CXR unremarkable for infection, no urinary complaints  History of left total knee arthroplasty with contusion -Status post fall -Knee xray showed no acute abnormality -Continue pain control and Ice  Hyperlipidemia -Continue Statin  Left shoulder pain -Suspect secondary to fall -Obtained x-ray showing no acute abnormality. Osteoarthritis  Consultants Orthopedics  Procedures  Left femur intramedullary fixation  Discharge Exam: Vitals:   01/13/17 2113 01/14/17 0544  BP: (!) 111/44 123/63  Pulse: 75 80  Resp: 16 16  Temp: 98.8 F (37.1 C) 98.1 F (36.7 C)   Patient feeling better today. Still complains of pain in her left hip and shoulder. Denies chest pain, shortness of breath, abdominal pain, N/V/D/C, dizziness, or headache.   Exam  General: Well developed, well nourished, no distress  HEENT: NCAT, mucous membranes moist.   Cardiovascular: S1 S2 auscultated, RRR, no murmurs  Respiratory: Clear to auscultation bilaterally  Abdomen: Soft, nontender, nondistended, + bowel sounds  Extremities: warm dry without cyanosis clubbing or edema. Small abrasion and hematoma on left knee. Bandage on left hip clean. No TTP of left shoulder, pain elicited with ROM  Neuro: AAOx3, nonfocal  Psych: Appropriate and pleasant   Discharge Instructions Discharge Instructions    Discharge instructions    Complete by:  As directed    Patient will be discharged to [facility], continue physical and occupational therapy.  Patient will need to follow up with primary care provider within one week of discharge, repeat BMP and discuss blood pressure management. Follow up with orthopedic surgeon, Dr. Delfino Lovett as scheduled.  Patient should continue medications as prescribed.  Patient should follow a heart healthy diet.     Current Discharge Medication List  START taking these medications   Details  HYDROcodone-acetaminophen (NORCO/VICODIN) 5-325 MG tablet Take 1-2 tablets by mouth every 6 (six) hours as needed for moderate pain. Qty: 60 tablet, Refills: 0      CONTINUE these medications which have CHANGED   Details  aspirin EC 81 MG EC tablet Take 1 tablet (81 mg total) by mouth 2 (two) times daily with a meal. Qty: 60 tablet, Refills: 1      CONTINUE these medications which have NOT CHANGED   Details  calcium carbonate (OS-CAL) 600 MG TABS Take 600 mg by mouth daily.    cyanocobalamin 500 MCG tablet Take 500 mcg by mouth 2 (two) times daily.    cycloSPORINE (RESTASIS) 0.05 % ophthalmic emulsion Place 1 drop into both eyes 2 (two) times daily.    lovastatin (MEVACOR) 40 MG tablet take 1 tablet once a day orally 90 days Refills: 4    metoprolol tartrate (LOPRESSOR) 25 MG tablet Take 25 mg by mouth 2 (two) times daily. Refills: 0    Misc Natural Products (TART CHERRY ADVANCED PO) Take by mouth daily.    OXcarbazepine (TRILEPTAL) 150 MG tablet Take 1 tablet (150 mg total) by mouth 2 (two) times daily. Qty: 180 tablet, Refills: 3    Prenatal Vit-Fe Fumarate-FA (PRENATAL MULTIVITAMIN) TABS Take 1 tablet by mouth every morning.     valsartan (DIOVAN) 160 MG tablet Take 160 mg by mouth at bedtime.       STOP taking these medications     hydrochlorothiazide (HYDRODIURIL) 25 MG tablet        Allergies  Allergen Reactions  . Tape     Pulled skin off. Paper tape is ok.   . Codeine Rash   Follow-up Information    Swinteck, Aaron Edelman, MD. Schedule an appointment as soon as possible for a visit in 2 week(s).   Specialty:  Orthopedic Surgery Why:  For wound re-check Contact information: [address] [place] [address] [phone]        Antony Blackbird, MD. Schedule an appointment as soon as possible for a visit in 1 week(s).   Specialty:  Family Medicine Why:  Hospital follow up Contact information: [address] [place] [address] [phone]            The results of significant diagnostics from this hospitalization (including imaging, microbiology, ancillary and laboratory) are listed below for reference.    Significant Diagnostic Studies: Dg Knee 2 Views Left  Result Date: 01/11/2017 CLINICAL DATA:  Recent fall with left knee pain, initial encounter EXAM: LEFT KNEE - 1-2 VIEW COMPARISON:  12/17/2015 FINDINGS: Left knee prosthesis is noted. No acute bony abnormality is seen. Calcified loose body is noted posteriorly. IMPRESSION: No acute abnormality noted. Electronically Signed   By: Inez Catalina M.D.   On: 01/11/2017 20:14   Pelvis Portable  Result Date: 01/12/2017 CLINICAL DATA:  Left hip fracture EXAM: PORTABLE PELVIS 1-2 VIEWS COMPARISON:  01/11/2017 FINDINGS: There is dynamic hip screw fixation of the intertrochanteric left hip fracture. Good alignment. No immediate complication. IMPRESSION: ORIF Electronically Signed   By: Andreas Newport M.D.   On: 01/12/2017 22:13   Dg Chest Port 1 View  Result Date: 01/11/2017 CLINICAL DATA:  Preoperative evaluation for left hip fracture EXAM: PORTABLE CHEST 1 VIEW COMPARISON:  02/11/2008 FINDINGS: Cardiac shadow is within normal limits. Elevation right hemidiaphragm is again seen. The lungs are clear. No bony abnormality is noted. IMPRESSION: No active disease. Electronically Signed  By: Inez Catalina M.D.   On: 01/11/2017 21:02   Dg Shoulder Left  Result Date: 01/13/2017 CLINICAL DATA:  Left shoulder pain since a fall 2 days ago. Initial encounter. EXAM: LEFT SHOULDER - 2+ VIEW COMPARISON:  None. FINDINGS: No acute bony or joint abnormality is identified. Moderate to moderately severe acromioclavicular and glenohumeral osteoarthritis is seen. Imaged ribs and lungs appear normal. IMPRESSION: No acute abnormality. Acromioclavicular and glenohumeral osteoarthritis. Electronically Signed   By: Inge Rise M.D.   On: 01/13/2017 11:03    Dg C-arm 1-60 Min-no Report  Result Date: 01/12/2017 Fluoroscopy was utilized by the requesting physician.  No radiographic interpretation.   Dg Hip Operative Unilat With Pelvis Left  Result Date: 01/12/2017 CLINICAL DATA:  Left hip IM nail EXAM: OPERATIVE left HIP (WITH PELVIS IF PERFORMED) 4 VIEWS TECHNIQUE: Fluoroscopic spot image(s) were submitted for interpretation post-operatively. COMPARISON:  01/11/2017 FINDINGS: Total fluoroscopy time was 74.6 seconds. Initial images demonstrate intertrochanteric fracture. Subsequent images demonstrate intramedullary rod and screw fixation of the proximal left femur across intertrochanteric fracture. IMPRESSION: Intraoperative fluoroscopy provided during intramedullary fixation of proximal femur fracture Electronically Signed   By: Donavan Foil M.D.   On: 01/12/2017 22:27   Dg Hip Unilat With Pelvis 2-3 Views Left  Result Date: 01/11/2017 CLINICAL DATA:  Fall today with left hip pain, initial encounter EXAM: DG HIP (WITH OR WITHOUT PELVIS) 3V LEFT COMPARISON:  None. FINDINGS: Pelvic ring is intact. Intratrochanteric fracture with mild displacement is seen in the proximal left femur. Mild degenerative changes of the lumbar spine and hip joints are noted. IMPRESSION: Left intratrochanteric fracture. Electronically Signed   By: Inez Catalina M.D.   On: 01/11/2017 20:13    Microbiology: Recent Results (from the past 240 hour(s))  Surgical pcr screen     Status: Abnormal   Collection Time: 01/12/17 12:46 AM  Result Value Ref Range Status   MRSA, PCR NEGATIVE NEGATIVE Final   Staphylococcus aureus POSITIVE (A) NEGATIVE Final    Comment:        The Xpert SA Assay (FDA approved for NASAL specimens in patients over 49 years of age), is one component of a comprehensive surveillance program.  Test performance has been validated by [facility] for patients greater than or equal to 46 year old. It is not intended to diagnose infection nor to guide or  monitor treatment.      Labs: Basic Metabolic Panel:  Recent Labs Lab 01/11/17 2104 01/12/17 0758 01/13/17 0557 01/14/17 0538 01/14/17 1237  NA 126* 128* 127* 129* 130*  K 3.8 3.8 3.8 4.2 4.6  CL 91* 90* 90* 92* 95*  CO2 27 31 28  32 32  GLUCOSE 120* 110* 138* 106* 129*  BUN 12 9 9 10 11   CREATININE 0.50 0.51 0.51 0.52 0.50  CALCIUM 8.9 8.9 8.5* 8.3* 8.2*   Liver Function Tests: No results for input(s): AST, ALT, ALKPHOS, BILITOT, PROT, ALBUMIN in the last 168 hours. No results for input(s): LIPASE, AMYLASE in the last 168 hours. No results for input(s): AMMONIA in the last 168 hours. CBC:  Recent Labs Lab 01/11/17 2104 01/12/17 0758 01/13/17 0557 01/14/17 0538  WBC 15.8* 8.7 9.2 7.1  NEUTROABS 13.7*  --   --   --   HGB 13.7 12.9 11.5* 10.2*  HCT 37.0 36.4 33.0* 29.1*  MCV 90.2 91.2 92.4 93.0  PLT 215 192 145* 125*   Cardiac Enzymes: No results for input(s): CKTOTAL, CKMB, CKMBINDEX, TROPONINI in the last 168 hours.  BNP: BNP (last 3 results) No results for input(s): BNP in the last 8760 hours.  ProBNP (last 3 results) No results for input(s): PROBNP in the last 8760 hours.  CBG: No results for input(s): GLUCAP in the last 168 hours.     SignedCristal Ford  [facility] 01/14/2017, 1:51 PM

## 2017-01-14 NOTE — Progress Notes (Signed)
Patient is set to discharge to [facility] today. Patient & family aware. Discharge packet given to RN. [facility] called for transport.   Kingsley Spittle, [place] Clinical Social Worker [phone]

## 2017-01-14 NOTE — Progress Notes (Signed)
Nurse reviewed discharge information with pt and family. Pt's family at bedside during arrival 54. Nurse called [facility] to give report but supervisor not available to take report at this time.  Message left at [facility] for supervisor to call nurse back for report. Discharge packet given to [facility] to take to [facility] place.

## 2017-01-16 ENCOUNTER — Non-Acute Institutional Stay (SKILLED_NURSING_FACILITY): Payer: PPO | Admitting: Internal Medicine

## 2017-01-16 ENCOUNTER — Encounter: Payer: Self-pay | Admitting: Internal Medicine

## 2017-01-16 DIAGNOSIS — R2681 Unsteadiness on feet: Secondary | ICD-10-CM

## 2017-01-16 DIAGNOSIS — E871 Hypo-osmolality and hyponatremia: Secondary | ICD-10-CM

## 2017-01-16 DIAGNOSIS — M792 Neuralgia and neuritis, unspecified: Secondary | ICD-10-CM

## 2017-01-16 DIAGNOSIS — M25512 Pain in left shoulder: Secondary | ICD-10-CM

## 2017-01-16 DIAGNOSIS — R1310 Dysphagia, unspecified: Secondary | ICD-10-CM | POA: Diagnosis not present

## 2017-01-16 DIAGNOSIS — S72142S Displaced intertrochanteric fracture of left femur, sequela: Secondary | ICD-10-CM

## 2017-01-16 DIAGNOSIS — D696 Thrombocytopenia, unspecified: Secondary | ICD-10-CM | POA: Diagnosis not present

## 2017-01-16 DIAGNOSIS — I10 Essential (primary) hypertension: Secondary | ICD-10-CM

## 2017-01-16 DIAGNOSIS — D500 Iron deficiency anemia secondary to blood loss (chronic): Secondary | ICD-10-CM | POA: Diagnosis not present

## 2017-01-16 DIAGNOSIS — E785 Hyperlipidemia, unspecified: Secondary | ICD-10-CM | POA: Diagnosis not present

## 2017-01-16 DIAGNOSIS — K5901 Slow transit constipation: Secondary | ICD-10-CM

## 2017-01-16 DIAGNOSIS — J301 Allergic rhinitis due to pollen: Secondary | ICD-10-CM

## 2017-01-16 DIAGNOSIS — S8002XS Contusion of left knee, sequela: Secondary | ICD-10-CM

## 2017-01-16 NOTE — Progress Notes (Signed)
LOCATION: [facility]  PCP: Antony Blackbird, MD   Code Status: Full Code  Goals of care: Advanced Directive information Advanced Directives 01/11/2017  Does Patient Have a Medical Advance Directive? No  Would patient like information on creating a medical advance directive? No - Patient declined     Extended Emergency Contact Information Primary Emergency Contact: Welshans,James Address: [place], [place] [address] [place] Phone: [phone] Relation: Spouse   Allergies  Allergen Reactions  . Tape     Pulled skin off. Paper tape is ok.   . Codeine Rash    Chief Complaint  Patient presents with  . New Admit To SNF    New Admission Visit      HPI:  Patient is a 83 y.o. female seen today for short term rehabilitation post hospital admission from 01/11/17-01/14/17 post fall with left intratrochanteric fracture. She was seen by orthopedics and underwent left femur intramedullary fixation. She had dehydration with electrolyte imbalance and her HCTZ was held and she received iv fluids. She also sustained contusion to left knee and xray ruled out fracture. She is seen in her room today with her husband and daughter at bedside.   Review of Systems:  Constitutional: Negative for fever, chills, diaphoresis. Energy level is fair. HENT: Negative for headache,sore throat. Positive for sinus congestion, nasal discharge and occasional difficulty swallowing.   Eyes: Negative for eye pain, blurred vision, double vision and discharge.  Respiratory: Negative for cough, shortness of breath and wheezing.   Cardiovascular: Negative for chest pain, palpitations, leg swelling.  Gastrointestinal: Negative for heartburn, nausea, vomiting, abdominal pain, loss of appetite, melena. Last bowel movement was Wednesday night. Passing gas. At home had bowel movement every day.  Genitourinary: Negative for dysuria. Increased urinary frequency at  night. Musculoskeletal: Negative for back pain, fall in the facility. left leg pain under control with current pain regimen.  Skin: Negative for itching, rash.  Neurological: Negative for dizziness. Psychiatric/Behavioral: Negative for depression   Past Medical History:  Diagnosis Date  . Arthritis   . Diarrhea    "sometimes ., due to Gallbladder removed"  . High cholesterol   . History of shingles    effecrt nerve sees neurologist in [facility]  . Hypertension   . Post herpetic neuralgia 08/15/2016   Right V1  . Shingles    Past Surgical History:  Procedure Laterality Date  . ABDOMINAL HYSTERECTOMY     COMPLETE  . CARPAL TUNNEL RELEASE Bilateral   . CHOLECYSTECTOMY    . EYE SURGERY     Cornea  . FEMUR IM NAIL Left 01/12/2017   Procedure: INTRAMEDULLARY (IM) NAIL FEMORAL LEFT;  Surgeon: Rod Can, MD;  Location: [facility];  Service: Orthopedics;  Laterality: Left;  Marland Kitchen GAS INSERTION Right 01/27/2015   Procedure: INSERTION OF GAS;  Surgeon: Hayden Pedro, MD;  Location: [place];  Service: Ophthalmology;  Laterality: Right;  C3F8  . JOINT REPLACEMENT Right 4 YRS AGO   KNEE  . KNEE ARTHROSCOPY Bilateral   . MEMBRANE PEEL Right 01/27/2015   Procedure: MEMBRANE PEEL;  Surgeon: Hayden Pedro, MD;  Location: [place];  Service: Ophthalmology;  Laterality: Right;  . PERFLUORONE INJECTION Right 01/27/2015   Procedure: PERFLUORONE INJECTION;  Surgeon: Hayden Pedro, MD;  Location: [place];  Service: Ophthalmology;  Laterality: Right;  . PHOTOCOAGULATION WITH LASER Right 01/27/2015   Procedure: PHOTOCOAGULATION WITH LASER;  Surgeon:  Hayden Pedro, MD;  Location: [place];  Service: Ophthalmology;  Laterality: Right;  ENDOLASER  . 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 TOTAL KNEE ARTHROPLASTY Left 12/17/2015    Procedure: LEFT TOTAL KNEE ARTHROPLASTY;  Surgeon: Susa Day, MD;  [facility];  Service: Orthopedics;  Laterality: Left;  Marland Kitchen VITRECTOMY 25 GAUGE WITH SCLERAL BUCKLE Right 01/27/2015   Procedure: VITRECTOMY 25 GAUGE WITH SCLERAL BUCKLE;  Surgeon: Hayden Pedro, MD;  Location: [place];  Service: Ophthalmology;  Laterality: Right;   Social History:   reports that she has never smoked. She has never used smokeless tobacco. She reports that she does not drink alcohol or use drugs.  Family History  Problem Relation Age of Onset  . Heart failure Mother   . Cancer - Prostate Father   . Heart disease Brother   . Cancer Sister   . Cancer Brother     Medications: Allergies as of 01/16/2017      Reactions   Tape    Pulled skin off. Paper tape is ok.    Codeine Rash      Medication List       Accurate as of 01/16/17 12:52 PM. Always use your most recent med list.          aspirin 81 MG EC tablet Take 1 tablet (81 mg total) by mouth 2 (two) times daily with a meal.   calcium carbonate 600 MG Tabs tablet Commonly known as:  OS-CAL Take 600 mg by mouth daily.   cyanocobalamin 500 MCG tablet Take 500 mcg by mouth 2 (two) times daily.   cycloSPORINE 0.05 % ophthalmic emulsion Commonly known as:  RESTASIS Place 1 drop into both eyes 2 (two) times daily.   HYDROcodone-acetaminophen 5-325 MG tablet Commonly known as:  NORCO/VICODIN Take 1-2 tablets by mouth every 6 (six) hours as needed for moderate pain.   lovastatin 40 MG tablet Commonly known as:  MEVACOR take 1 tablet once a day orally 90 days   metoprolol tartrate 25 MG tablet Commonly known as:  LOPRESSOR Take 25 mg by mouth 2 (two) times daily.   OXcarbazepine 150 MG tablet Commonly known as:  TRILEPTAL Take 1 tablet (150 mg total) by mouth 2 (two) times daily.   prenatal multivitamin Tabs tablet Take 1 tablet by mouth every morning.   TART CHERRY ADVANCED PO Take by mouth daily.   valsartan 160 MG  tablet Commonly known as:  DIOVAN Take 160 mg by mouth at bedtime.       Immunizations: Immunization History  Administered Date(s) Administered  . PPD Test 01/14/2017     Physical Exam: Vitals:   01/16/17 1241  BP: 118/88  Pulse: 82  Resp: 18  Temp: 98.7 F (37.1 C)  TempSrc: Oral  SpO2: 97%  Weight: 178 lb 11.2 oz (81.1 kg)  Height: 5\' 3"  (1.6 m)   Body mass index is 31.66 kg/m.  General- elderly female, obese, in no acute distress Head- normocephalic, atraumatic Nose- + clear nasal discharge Throat- moist mucus membrane, normal oropharynx, has dentures Eyes- PERRLA, EOMI, no pallor, no icterus, no discharge, normal conjunctiva, normal sclera Neck- no cervical lymphadenopathy Cardiovascular- normal R6,E4,  + systolic murmur Respiratory- bilateral clear to auscultation, no wheeze, no rhonchi, no crackles, no use of accessory muscles, on 2 liters oxygen by nasal  canula Abdomen- bowel sounds present, soft, non tender, no guarding or rigidity Musculoskeletal- able to move all 4 extremities, limited left leg range of motion, left hip 3 surgical incision with glue and dressing over it, trace leg edema Neurological- alert and oriented to person, place and time Skin- warm and dry, bruise to left arm, shoulder and left knee Psychiatry- normal mood and affect    Labs reviewed: Basic Metabolic Panel:  Recent Labs  01/13/17 0557 01/14/17 0538 01/14/17 1237  NA 127* 129* 130*  K 3.8 4.2 4.6  CL 90* 92* 95*  CO2 28 32 32  GLUCOSE 138* 106* 129*  BUN 9 10 11   CREATININE 0.51 0.52 0.50  CALCIUM 8.5* 8.3* 8.2*   Liver Function Tests:  Recent Labs  08/15/16 1253  AST 17  ALT 13  ALKPHOS 73  BILITOT 0.8  PROT 6.6  ALBUMIN 4.2   No results for input(s): LIPASE, AMYLASE in the last 8760 hours. No results for input(s): AMMONIA in the last 8760 hours. CBC:  Recent Labs  08/15/16 1253  01/11/17 2104 01/12/17 0758 01/13/17 0557 01/14/17 0538  WBC 7.1  < >  15.8* 8.7 9.2 7.1  NEUTROABS 4.9  --  13.7*  --   --   --   HGB  --   < > 13.7 12.9 11.5* 10.2*  HCT 39.6  < > 37.0 36.4 33.0* 29.1*  MCV 94  < > 90.2 91.2 92.4 93.0  PLT 309  < > 215 192 145* 125*  < > = values in this interval not displayed. Cardiac Enzymes: No results for input(s): CKTOTAL, CKMB, CKMBINDEX, TROPONINI in the last 8760 hours. BNP: Invalid input(s): POCBNP CBG: No results for input(s): GLUCAP in the last 8760 hours.  Radiological Exams: Dg Knee 2 Views Left  Result Date: 01/11/2017 CLINICAL DATA:  Recent fall with left knee pain, initial encounter EXAM: LEFT KNEE - 1-2 VIEW COMPARISON:  12/17/2015 FINDINGS: Left knee prosthesis is noted. No acute bony abnormality is seen. Calcified loose body is noted posteriorly. IMPRESSION: No acute abnormality noted. Electronically Signed   By: Inez Catalina M.D.   On: 01/11/2017 20:14   Pelvis Portable  Result Date: 01/12/2017 CLINICAL DATA:  Left hip fracture EXAM: PORTABLE PELVIS 1-2 VIEWS COMPARISON:  01/11/2017 FINDINGS: There is dynamic hip screw fixation of the intertrochanteric left hip fracture. Good alignment. No immediate complication. IMPRESSION: ORIF Electronically Signed   By: Andreas Newport M.D.   On: 01/12/2017 22:13   Dg Chest Port 1 View  Result Date: 01/11/2017 CLINICAL DATA:  Preoperative evaluation for left hip fracture EXAM: PORTABLE CHEST 1 VIEW COMPARISON:  02/11/2008 FINDINGS: Cardiac shadow is within normal limits. Elevation right hemidiaphragm is again seen. The lungs are clear. No bony abnormality is noted. IMPRESSION: No active disease. Electronically Signed   By: Inez Catalina M.D.   On: 01/11/2017 21:02   Dg Shoulder Left  Result Date: 01/13/2017 CLINICAL DATA:  Left shoulder pain since a fall 2 days ago. Initial encounter. EXAM: LEFT SHOULDER - 2+ VIEW COMPARISON:  None. FINDINGS: No acute bony or joint abnormality is identified. Moderate to moderately severe acromioclavicular and glenohumeral  osteoarthritis is seen. Imaged ribs and lungs appear normal. IMPRESSION: No acute abnormality. Acromioclavicular and glenohumeral osteoarthritis. Electronically Signed   By: Inge Rise M.D.   On: 01/13/2017 11:03   Dg C-arm 1-60 Min-no Report  Result Date: 01/12/2017 Fluoroscopy was utilized by the requesting physician.  No radiographic interpretation.   Dg Hip Operative  Unilat With Pelvis Left  Result Date: 01/12/2017 CLINICAL DATA:  Left hip IM nail EXAM: OPERATIVE left HIP (WITH PELVIS IF PERFORMED) 4 VIEWS TECHNIQUE: Fluoroscopic spot image(s) were submitted for interpretation post-operatively. COMPARISON:  01/11/2017 FINDINGS: Total fluoroscopy time was 74.6 seconds. Initial images demonstrate intertrochanteric fracture. Subsequent images demonstrate intramedullary rod and screw fixation of the proximal left femur across intertrochanteric fracture. IMPRESSION: Intraoperative fluoroscopy provided during intramedullary fixation of proximal femur fracture Electronically Signed   By: Donavan Foil M.D.   On: 01/12/2017 22:27   Dg Hip Unilat With Pelvis 2-3 Views Left  Result Date: 01/11/2017 CLINICAL DATA:  Fall today with left hip pain, initial encounter EXAM: DG HIP (WITH OR WITHOUT PELVIS) 3V LEFT COMPARISON:  None. FINDINGS: Pelvic ring is intact. Intratrochanteric fracture with mild displacement is seen in the proximal left femur. Mild degenerative changes of the lumbar spine and hip joints are noted. IMPRESSION: Left intratrochanteric fracture. Electronically Signed   By: Inez Catalina M.D.   On: 01/11/2017 20:13    Assessment/Plan  Unsteady gait Post fall with left femur fracture. Will have patient work with PT/OT as tolerated to regain strength and restore function.  Fall precautions are in place.  Left femur fracture Closed, s/p left IM fixation. Will need orthopedic follow up. Continue aspirin ec 81 mg bid for dvt prophylaxis. Will have her work with physical therapy and  occupational therapy team to help with gait training and muscle strengthening exercises.fall precautions. Skin care. Encourage to be out of bed. Continue calcium supplement. Continue hydrocodone-acetaminophen 5-325 mg 1-2 tablet q6h prn pain.  Hyponatremia Check bmp  Blood loss anemia Post op, monitor cbc  Thrombocytopenia No bleed reported. Monitor platelet count.  Constipation Add senna s 2 tab qhs and monitor, encouraged hydration  Dysphagia Occasional. Patient mentions having trouble swallowing. Also provides history of hypothyroidism. Check tsh. SLP to evaluate  Left knee contusion Hx of left TKA. fractue has been ruled out by imaging. Ice pack prn and pain med prn for now.   Left shoulder pain Post fall, negative for acute abnormalities on xray. ROM exercise and pain meds for now.  Allergic rhinitis Start claritin 10 mg daily and monitor x 2 weeks  HTN HCTZ discontinued in hospital with dehydration. Monitor BP reading. continue metoprolol tartrate 25 mg bid and valsartan 160 mg daily. Check BMP  Hyperlipidemia Continue lovastatin  neuralgia Continue oxcarbazepine and monitor   Goals of care: short term rehabilitation   Labs/tests ordered: cbc, bmp 01/23/17  Family/ staff Communication: reviewed care plan with patient and nursing supervisor  I have spent greater than 50 minutes for this encounter which includes reviewing hospital records, addressing above mentioned concerns, reviewing care plan with patient, her daughtert, answering patient's concerns and counseling her.     Blanchie Serve, MD Internal Medicine [facility] [address] [place], [place] [address] Cell Phone (Monday-Friday 8 am - 5 pm): [phone] On Call: [phone] and follow prompts after 5 pm and on weekends Office Phone: [phone] Office Fax: [phone]

## 2017-01-23 DIAGNOSIS — M25552 Pain in left hip: Secondary | ICD-10-CM | POA: Diagnosis not present

## 2017-01-23 LAB — CBC AND DIFFERENTIAL
HCT: 32 % — AB (ref 36–46)
Hemoglobin: 10.4 g/dL — AB (ref 12.0–16.0)
PLATELETS: 418 10*3/uL — AB (ref 150–399)

## 2017-01-23 LAB — TSH: TSH: 1.55 u[IU]/mL (ref 0.41–5.90)

## 2017-01-23 LAB — BASIC METABOLIC PANEL
BUN: 7 mg/dL (ref 4–21)
Creatinine: 0.4 mg/dL — AB (ref 0.5–1.1)
GLUCOSE: 90 mg/dL
POTASSIUM: 4.1 mmol/L (ref 3.4–5.3)
Sodium: 137 mmol/L (ref 137–147)

## 2017-01-25 ENCOUNTER — Encounter: Payer: Self-pay | Admitting: Family

## 2017-01-25 ENCOUNTER — Non-Acute Institutional Stay (SKILLED_NURSING_FACILITY): Payer: PPO | Admitting: Family

## 2017-01-25 DIAGNOSIS — K219 Gastro-esophageal reflux disease without esophagitis: Secondary | ICD-10-CM

## 2017-01-25 DIAGNOSIS — R600 Localized edema: Secondary | ICD-10-CM

## 2017-01-25 NOTE — Progress Notes (Signed)
Location:  [place] Room Number: 250N Place of Service:  SNF (31) Provider: Hallelujah Wysong FNP-C  Antony Blackbird, MD  Patient Care Team: Antony Blackbird, MD as PCP - General (Family Medicine)  Extended Emergency Contact Information Primary Emergency Contact: Mazor,James Address: [place], [place] [address] [place] Phone: [phone] Relation: Spouse Secondary Emergency Contact: Hillhouse,Janice Address: [place], [place] [address] [place] of [place] Phone: [phone] Mobile Phone: [phone] Relation: Other  Code Status: Full Code  Goals of care: Advanced Directive information Advanced Directives 01/11/2017  Does Patient Have a Medical Advance Directive? No  Would patient like information on creating a medical advance directive? No - Patient declined     Chief Complaint  Patient presents with  . Acute Visit    BLE swelling and edema; ace wrap not working    HPI:  Pt is a 83 y.o. female seen today at [facility] for an acute visit for evaluation of lower extremities edema.she is status post left femur intramedullary fixation. She is seen in her room today with family at bedside. She complains of lower extremities edema. She states since started on ACE wrap the edema has improved but not resolved. She was seen by PMR specialist ACE wrap ordered. She also complains of acid reflux requesting antacid. Patient's daughter worried about sodium level states was low at the hospital.Also states TSH level not checked in several years.Recent lab results discussed with patient and daughter: Na 137, TSH level 1.552, Hgb 10.4 ( 01/23/2017).      Past Medical History:  Diagnosis Date  . Arthritis   . Diarrhea    "sometimes ., due to Gallbladder removed"  . High cholesterol   . History of shingles    effecrt nerve sees neurologist in [facility]  .  Hypertension   . Post herpetic neuralgia 08/15/2016   Right V1  . Shingles    Past Surgical History:  Procedure Laterality Date  . ABDOMINAL HYSTERECTOMY     COMPLETE  . CARPAL TUNNEL RELEASE Bilateral   . CHOLECYSTECTOMY    . EYE SURGERY     Cornea  . FEMUR IM NAIL Left 01/12/2017   Procedure: INTRAMEDULLARY (IM) NAIL FEMORAL LEFT;  Surgeon: Rod Can, MD;  [facility];  Service: Orthopedics;  Laterality: Left;  Marland Kitchen GAS INSERTION Right 01/27/2015   Procedure: INSERTION OF GAS;  Surgeon: Hayden Pedro, MD;  Location: [facility];  Service: Ophthalmology;  Laterality: Right;  C3F8  . JOINT REPLACEMENT Right 4 YRS AGO   KNEE  . KNEE ARTHROSCOPY Bilateral   . MEMBRANE PEEL Right 01/27/2015   Procedure: MEMBRANE PEEL;  Surgeon: Hayden Pedro, MD;  Location: [place];  Service: Ophthalmology;  Laterality: Right;  . PERFLUORONE INJECTION Right 01/27/2015   Procedure: PERFLUORONE INJECTION;  Surgeon: Hayden Pedro, MD;  Location: [facility];  Service: Ophthalmology;  Laterality: Right;  . PHOTOCOAGULATION WITH LASER Right 01/27/2015   Procedure: PHOTOCOAGULATION WITH LASER;  Surgeon: Hayden Pedro, MD;  Location: [facility];  Service: Ophthalmology;  Laterality: Right;  ENDOLASER  . 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 TOTAL KNEE ARTHROPLASTY Left 12/17/2015  Procedure: LEFT TOTAL KNEE ARTHROPLASTY;  Surgeon: Susa Day, MD;  [facility];  Service: Orthopedics;  Laterality: Left;  Marland Kitchen VITRECTOMY 25 GAUGE WITH SCLERAL BUCKLE Right 01/27/2015   Procedure: VITRECTOMY 25 GAUGE WITH SCLERAL BUCKLE;  Surgeon: Hayden Pedro, MD;  Location: [facility];  Service: Ophthalmology;  Laterality: Right;    Allergies  Allergen Reactions  . Tape     Pulled skin off. Paper tape is ok.   . Codeine Rash    Allergies as of 01/25/2017       Reactions   Tape    Pulled skin off. Paper tape is ok.    Codeine Rash      Medication List       Accurate as of 01/25/17  4:51 PM. Always use your most recent med list.          aspirin 81 MG EC tablet Take 1 tablet (81 mg total) by mouth 2 (two) times daily with a meal.   calcium carbonate 600 MG Tabs tablet Commonly known as:  OS-CAL Take 600 mg by mouth daily.   cyanocobalamin 500 MCG tablet Take 500 mcg by mouth 2 (two) times daily.   cycloSPORINE 0.05 % ophthalmic emulsion Commonly known as:  RESTASIS Place 1 drop into both eyes 2 (two) times daily.   docusate sodium 100 MG capsule Commonly known as:  COLACE Take 100 mg by mouth 2 (two) times daily.   HYDROcodone-acetaminophen 5-325 MG tablet Commonly known as:  NORCO/VICODIN Take 1-2 tablets by mouth every 6 (six) hours as needed for moderate pain.   loratadine 10 MG tablet Commonly known as:  CLARITIN Take 10 mg by mouth daily.   lovastatin 40 MG tablet Commonly known as:  MEVACOR take 1 tablet once a day orally 90 days   metoprolol tartrate 25 MG tablet Commonly known as:  LOPRESSOR Take 25 mg by mouth 2 (two) times daily.   OXcarbazepine 150 MG tablet Commonly known as:  TRILEPTAL Take 1 tablet (150 mg total) by mouth 2 (two) times daily.   prenatal multivitamin Tabs tablet Take 1 tablet by mouth every morning.   sennosides-docusate sodium 8.6-50 MG tablet Commonly known as:  SENOKOT-S Take 2 tablets by mouth at bedtime.   TART CHERRY ADVANCED PO Take by mouth daily.   valsartan 160 MG tablet Commonly known as:  DIOVAN Take 160 mg by mouth at bedtime.       Review of Systems  Constitutional: Negative for activity change, appetite change, chills, fatigue and fever.  HENT: Negative for congestion, rhinorrhea, sinus pain, sinus pressure, sneezing and sore throat.   Eyes: Negative.   Respiratory: Negative for chest tightness, shortness of breath and wheezing.   Cardiovascular:  Positive for leg swelling. Negative for chest pain and palpitations.  Gastrointestinal: Negative for abdominal distention, abdominal pain, constipation, diarrhea, nausea and vomiting.       Acid reflux   Genitourinary: Negative for dysuria, flank pain, frequency and urgency.  Musculoskeletal: Positive for gait problem.       Left hip pain under control  Skin: Negative for color change, pallor and rash.  Neurological: Negative for dizziness, seizures, syncope and headaches.  Hematological: Does not bruise/bleed easily.  Psychiatric/Behavioral: Negative for agitation, confusion, hallucinations and sleep disturbance. The patient is not nervous/anxious.     Immunization History  Administered Date(s) Administered  . PPD Test 01/14/2017   Pertinent  Health Maintenance Due  Topic Date Due  . PNA vac Low Risk Adult (1 of  2 - PCV13) 08/01/2001  . INFLUENZA VACCINE  04/12/2017  . DEXA SCAN  Completed    Vitals:   01/25/17 0957  BP: 129/60  Pulse: 80  Resp: 16  Temp: (!) 96 F (35.6 C)  SpO2: 91%  Weight: 178 lb (80.7 kg)  Height: 5\' 3"  (1.6 m)   Body mass index is 31.53 kg/m. Physical Exam  Constitutional: She is oriented to person, place, and time. She appears well-developed and well-nourished. No distress.  HENT:  Head: Normocephalic.  Mouth/Throat: Oropharynx is clear and moist. No oropharyngeal exudate.  Eyes: Conjunctivae and EOM are normal. Pupils are equal, round, and reactive to light. Right eye exhibits no discharge. Left eye exhibits no discharge. No scleral icterus.  Neck: Normal range of motion. No JVD present. No thyromegaly present.  Cardiovascular: Normal rate, regular rhythm, normal heart sounds and intact distal pulses.  Exam reveals no gallop and no friction rub.   No murmur heard. Pulmonary/Chest: Effort normal and breath sounds normal. No respiratory distress. She has no wheezes. She has no rales.  Abdominal: Soft. Bowel sounds are normal. She exhibits no  distension. There is no tenderness. There is no rebound and no guarding.  Musculoskeletal:  Unsteady gait. Moves x 4 extremities except left hip limited due to pain. Bilateral lower extremities 2+ edema. ACE wrap in place.   Lymphadenopathy:    She has no cervical adenopathy.  Neurological: She is oriented to person, place, and time.  Skin: Skin is warm and dry. No rash noted. No erythema. No pallor.  Psychiatric: She has a normal mood and affect.    Labs reviewed:  Recent Labs  01/13/17 0557 01/14/17 0538 01/14/17 1237 01/23/17  NA 127* 129* 130* 137  K 3.8 4.2 4.6 4.1  CL 90* 92* 95*  --   CO2 28 32 32  --   GLUCOSE 138* 106* 129*  --   BUN 9 10 11 7   CREATININE 0.51 0.52 0.50 0.4*  CALCIUM 8.5* 8.3* 8.2*  --     Recent Labs  08/15/16 1253  AST 17  ALT 13  ALKPHOS 73  BILITOT 0.8  PROT 6.6  ALBUMIN 4.2    Recent Labs  08/15/16 1253  01/11/17 2104 01/12/17 0758 01/13/17 0557 01/14/17 0538 01/23/17  WBC 7.1  < > 15.8* 8.7 9.2 7.1  --   NEUTROABS 4.9  --  13.7*  --   --   --   --   HGB  --   < > 13.7 12.9 11.5* 10.2* 10.4*  HCT 39.6  < > 37.0 36.4 33.0* 29.1* 32*  MCV 94  < > 90.2 91.2 92.4 93.0  --   PLT 309  < > 215 192 145* 125* 418*  < > = values in this interval not displayed.   Lab Results  Component Value Date   TSH 1.55 01/23/2017     Assessment/Plan 1. Localized edema Bilateral 2+ to lower extremities. Has improved with ACE wrap but not resolved. HZCT discontinued during hospital admission due to dehydration and hyponatremia. Facility wound care Nurse to apply bilateral lower extremities kerlix and ACE wrap from toes to knee high. Recheck BMP 02/01/2017. Continue to encourage to elevate legs.   2. Gastroesophageal reflux disease without esophagitis Start on omeprazole 20 mg Capsule by mouth daily.    Family/ staff Communication: Reviewed plan of care with patient and facility Nurse supervisor  Labs/tests ordered: CBC, BMP 02/01/2017  Sandrea Hughs, NP

## 2017-01-30 DIAGNOSIS — S72145D Nondisplaced intertrochanteric fracture of left femur, subsequent encounter for closed fracture with routine healing: Secondary | ICD-10-CM | POA: Diagnosis not present

## 2017-01-31 ENCOUNTER — Non-Acute Institutional Stay (SKILLED_NURSING_FACILITY): Payer: PPO | Admitting: Family

## 2017-01-31 DIAGNOSIS — Z8781 Personal history of (healed) traumatic fracture: Secondary | ICD-10-CM

## 2017-01-31 DIAGNOSIS — I10 Essential (primary) hypertension: Secondary | ICD-10-CM | POA: Diagnosis not present

## 2017-01-31 DIAGNOSIS — E782 Mixed hyperlipidemia: Secondary | ICD-10-CM | POA: Diagnosis not present

## 2017-01-31 DIAGNOSIS — K219 Gastro-esophageal reflux disease without esophagitis: Secondary | ICD-10-CM | POA: Diagnosis not present

## 2017-01-31 DIAGNOSIS — K5901 Slow transit constipation: Secondary | ICD-10-CM | POA: Diagnosis not present

## 2017-01-31 DIAGNOSIS — R2681 Unsteadiness on feet: Secondary | ICD-10-CM | POA: Diagnosis not present

## 2017-01-31 NOTE — Progress Notes (Signed)
Location:  [facility] Room Number: 907  Place of Service:  SNF [phone])  Provider: Marlowe Sax FNP-C   PCP: Antony Blackbird, MD Patient Care Team: Antony Blackbird, MD as PCP - General (Family Medicine)  Extended Emergency Contact Information Primary Emergency Contact: Senske,James Address: [place], [place] [address] Johnnette Litter of [place] Phone: [phone] Relation: Spouse Secondary Emergency Contact: Giacobbe,Janice Address: [address]          [place], [place] [address] Johnnette Litter of [place] Phone: [phone] Mobile Phone: [phone] Relation: Other  Code Status: Full code  Goals of care:  Advanced Directive information Advanced Directives 01/11/2017  Does Patient Have a Medical Advance Directive? No  Would patient like information on creating a medical advance directive? No - Patient declined     Allergies  Allergen Reactions  . Tape     Pulled skin off. Paper tape is ok.   . Codeine Rash    Chief Complaint  Patient presents with  . Discharge Note    Discharge home from SNF    HPI:  83 y.o. female seen today at [facility] for discharge home.She was here for short term rehabilitation for post hospital admission from 01/11/2017-01/14/2017 post fall with left intratrochanteric fracture. She was seen by orthopedics and underwent left femur intramedullary fixation.She had dehydration with electrolyte imbalance and her HCTZ was held and she received I.V fluids.She also sustained contusion to left knee and x-ray ruled out fracture.She has a medical history of HTN,Hyperlipidemia, GERD, Neuralgia, Neurogenic bladder, depression, Obesity among other conditions.she is seen in her room with daughter at bedside.She denies any acute issues this visit.She was seen by PMR specialist during rehab stay for pain management.Bilateral lower extremities knee ted hose was ordered for edema.she      has worked well with PT/OT now stable for discharge home.She will be discharged home with outpatient therapy at the Ortho doctor's office  to continue with ROM, Exercise, Gait stability and muscle strengthening. No DME required has own walker/cane at home. Home health services will be arranged by facility social worker prior to discharge. Prescription medication will be written x 1 month then patient to follow up with PCP in 1-2 weeks. Facility staff report no new concerns.   Past Medical History:  Diagnosis Date  . Arthritis   . Diarrhea    "sometimes ., due to Gallbladder removed"  . High cholesterol   . History of shingles    effecrt nerve sees neurologist in [facility]  . Hypertension   . Post herpetic neuralgia 08/15/2016   Right V1  . Shingles     Past Surgical History:  Procedure Laterality Date  . ABDOMINAL HYSTERECTOMY     COMPLETE  . CARPAL TUNNEL RELEASE Bilateral   . CHOLECYSTECTOMY    . EYE SURGERY     Cornea  . FEMUR IM NAIL Left 01/12/2017   Procedure: INTRAMEDULLARY (IM) NAIL FEMORAL LEFT;  Surgeon: Rod Can, MD;  Location: [facility];  Service: Orthopedics;  Laterality: Left;  Marland Kitchen GAS INSERTION Right 01/27/2015   Procedure: INSERTION OF GAS;  Surgeon: Hayden Pedro, MD;  Location: [facility];  Service: Ophthalmology;  Laterality: Right;  C3F8  . JOINT REPLACEMENT Right 4 YRS AGO   KNEE  . KNEE ARTHROSCOPY Bilateral   . MEMBRANE PEEL Right 01/27/2015   Procedure: MEMBRANE PEEL;  Surgeon: Hayden Pedro, MD;  Location: [facility];  Service: Ophthalmology;  Laterality: Right;  . PERFLUORONE INJECTION Right 01/27/2015   Procedure: PERFLUORONE INJECTION;  Surgeon: Hayden Pedro, MD;  Location: [facility];  Service: Ophthalmology;  Laterality: Right;  . PHOTOCOAGULATION WITH LASER Right 01/27/2015   Procedure: PHOTOCOAGULATION WITH LASER;  Surgeon: Hayden Pedro, MD;  Location: [facility];  Service: Ophthalmology;  Laterality: Right;  ENDOLASER  . 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 TOTAL KNEE ARTHROPLASTY Left 12/17/2015   Procedure: LEFT TOTAL KNEE ARTHROPLASTY;  Surgeon: Susa Day, MD;  [facility];  Service: Orthopedics;  Laterality: Left;  Marland Kitchen VITRECTOMY 25 GAUGE WITH SCLERAL BUCKLE Right 01/27/2015   Procedure: VITRECTOMY 25 GAUGE WITH SCLERAL BUCKLE;  Surgeon: Hayden Pedro, MD;  Location: [facility];  Service: Ophthalmology;  Laterality: Right;      reports that she has never smoked. She has never used smokeless tobacco. She reports that she does not drink alcohol or use drugs. Social History   Social History  . Marital status: Married    Spouse name: N/A  . Number of children: 3  . Years of education: 10   Occupational History  . N/A    Social History Main Topics  . Smoking status: Never Smoker  . Smokeless tobacco: Never Used  . Alcohol use No  . Drug use: No  . Sexual activity: Not on file   Other Topics Concern  . Not on file   Social History Narrative   Lives at home w/ her husband   Right-handed   Caffeine: 2 cups of half caf coffee each morning    Allergies  Allergen Reactions  . Tape     Pulled skin off. Paper tape is ok.   . Codeine Rash    Pertinent  Health Maintenance Due  Topic Date Due  . PNA vac Low Risk Adult (1 of 2 - PCV13) 08/01/2001  . INFLUENZA VACCINE  04/12/2017  . DEXA SCAN  Completed    Medications: Allergies as of 01/31/2017      Reactions   Tape    Pulled skin off. Paper tape is ok.    Codeine Rash      Medication List       Accurate as of 01/31/17  1:52 PM. Always use your most recent med list.          aspirin 81 MG EC tablet Take 1 tablet (81 mg total) by mouth 2 (two) times daily with a meal.   calcium carbonate 600 MG Tabs tablet Commonly known as:  OS-CAL Take 600 mg by mouth daily.   cyanocobalamin 500  MCG tablet Take 500 mcg by mouth 2 (two) times daily.   cycloSPORINE 0.05 % ophthalmic emulsion Commonly known as:  RESTASIS Place 1 drop into both eyes 2 (two) times daily.   docusate sodium 100 MG capsule Commonly known as:  COLACE Take 100 mg by mouth 2 (two) times daily.   HYDROcodone-acetaminophen 5-325 MG tablet Commonly known as:  NORCO/VICODIN Take 1-2 tablets by mouth every 6 (six) hours as needed for moderate pain.   loratadine 10 MG tablet Commonly known as:  CLARITIN Take 10 mg by mouth daily.   lovastatin 40 MG tablet Commonly known as:  MEVACOR take 1 tablet once a day orally 90 days   methocarbamol 500 MG  tablet Commonly known as:  ROBAXIN Take 500 mg by mouth every 6 (six) hours as needed for muscle spasms.   metoprolol tartrate 25 MG tablet Commonly known as:  LOPRESSOR Take 25 mg by mouth 2 (two) times daily.   omeprazole 20 MG capsule Commonly known as:  PRILOSEC Take 20 mg by mouth daily.   OXcarbazepine 150 MG tablet Commonly known as:  TRILEPTAL Take 1 tablet (150 mg total) by mouth 2 (two) times daily.   prenatal multivitamin Tabs tablet Take 1 tablet by mouth every morning.   sennosides-docusate sodium 8.6-50 MG tablet Commonly known as:  SENOKOT-S Take 2 tablets by mouth at bedtime.   TART CHERRY ADVANCED PO Take by mouth daily.   valsartan 160 MG tablet Commonly known as:  DIOVAN Take 160 mg by mouth at bedtime.       Review of Systems  Constitutional: Negative for activity change, appetite change, chills, fatigue and fever.  HENT: Negative for congestion, rhinorrhea, sinus pain, sinus pressure, sneezing and sore throat.   Eyes: Negative.   Respiratory: Negative for chest tightness, shortness of breath and wheezing.   Cardiovascular: Positive for leg swelling. Negative for chest pain and palpitations.  Gastrointestinal: Negative for abdominal distention, abdominal pain, constipation, diarrhea, nausea and vomiting.       Acid  reflux   Endocrine: Negative.   Genitourinary: Negative for dysuria, flank pain, frequency and urgency.  Musculoskeletal: Positive for gait problem.       Left hip pain under control with current regimen   Skin: Negative for color change, pallor and rash.  Neurological: Negative for dizziness, seizures, syncope and headaches.  Hematological: Does not bruise/bleed easily.  Psychiatric/Behavioral: Negative for agitation, confusion, hallucinations and sleep disturbance. The patient is not nervous/anxious.     Vitals:   01/31/17 1130  BP: (!) 141/85  Pulse: 79  Resp: 18  Temp: 98.4 F (36.9 C)  SpO2: 96%  Weight: 178 lb (80.7 kg)  Height: 5\' 3"  (1.6 m)   Body mass index is 31.53 kg/m. Physical Exam  Constitutional: She is oriented to person, place, and time. She appears well-developed and well-nourished. No distress.  HENT:  Head: Normocephalic.  Mouth/Throat: Oropharynx is clear and moist. No oropharyngeal exudate.  Eyes: Conjunctivae and EOM are normal. Pupils are equal, round, and reactive to light. Right eye exhibits no discharge. Left eye exhibits no discharge. No scleral icterus.  Neck: Normal range of motion. No JVD present. No thyromegaly present.  Cardiovascular: Normal rate, regular rhythm, normal heart sounds and intact distal pulses.  Exam reveals no gallop and no friction rub.   No murmur heard. Pulmonary/Chest: Effort normal and breath sounds normal. No respiratory distress. She has no wheezes. She has no rales.  Abdominal: Soft. Bowel sounds are normal. She exhibits no distension. There is no tenderness. There is no rebound and no guarding.  Musculoskeletal: She exhibits no tenderness.  Unsteady gait.Bilateral lower extremities 1+ edema ted hose not in place during visit   Lymphadenopathy:    She has no cervical adenopathy.  Neurological: She is oriented to person, place, and time.  Skin: Skin is warm and dry. No rash noted. No erythema. No pallor.  Left knee  purple bruise non-tender to touch. Left hip surgical incision without any signs of infections.   Psychiatric: She has a normal mood and affect.    Labs reviewed: Basic Metabolic Panel:  Recent Labs  01/13/17 0557 01/14/17 0538 01/14/17 1237 01/23/17  NA 127* 129* 130* 137  K 3.8 4.2 4.6 4.1  CL 90* 92* 95*  --   CO2 28 32 32  --   GLUCOSE 138* 106* 129*  --   BUN 9 10 11 7   CREATININE 0.51 0.52 0.50 0.4*  CALCIUM 8.5* 8.3* 8.2*  --    Liver Function Tests:  Recent Labs  08/15/16 1253  AST 17  ALT 13  ALKPHOS 73  BILITOT 0.8  PROT 6.6  ALBUMIN 4.2   CBC:  Recent Labs  08/15/16 1253  01/11/17 2104 01/12/17 0758 01/13/17 0557 01/14/17 0538 01/23/17  WBC 7.1  < > 15.8* 8.7 9.2 7.1  --   NEUTROABS 4.9  --  13.7*  --   --   --   --   HGB  --   < > 13.7 12.9 11.5* 10.2* 10.4*  HCT 39.6  < > 37.0 36.4 33.0* 29.1* 32*  MCV 94  < > 90.2 91.2 92.4 93.0  --   PLT 309  < > 215 192 145* 125* 418*  < > = values in this interval not displayed.  Assessment/Plan:   1. Unsteady gait Has worked well with PT/ OT.Will discharge home with outpatient therapy at the Ortho doctor's office  to continue with ROM, Exercise, Gait stability and muscle strengthening. No DME required has own walker/cane at home. Fall and safety precautions.   2. Essential hypertension HCTZ discontinued in hospital with dehydration.B/p stable.continue on metoprolol 25 mg Tablet twice daily and valsartan 160 mg tablet daily.BMP in 1-2 weeks with PCP.    3. Mixed hyperlipidemia Continue lovastatin. Monitor lipid panel periodically.  4. Gastroesophageal reflux disease without esophagitis Stable.Continue on omeprazole.   5. Slow transit constipation Current regimen effective.   6. Status post closed fracture of left femur Continue aspirin ec 81 mg bid for DVT prophylaxis.Continue hydrocodone-acetaminophen 5-325 mg 1-2 tablet q6h prn pain.continue to follow up with Ortho. Continue outpatient therapy. Wean  off pain medication as tolerated. Fall and safety precautions.CBC in 1-2 weeks with PCP.   Patient is being discharged with the following home health services:    -  Will discharge home with Outpatient therapy at the Ortho doctor's office   Patient is being discharged with the following durable medical equipment:   - None required has own walker/cane.   Patient has been advised to f/u with their PCP in 1-2 weeks to for a transitions of care visit.Social services at their facility was responsible for arranging this appointment.  Pt was provided with adequate prescriptions of noncontrolled medications to reach the scheduled appointment.For controlled substances, a limited supply was provided as appropriate for the individual patient. If the pt normally receives these medications from a pain clinic or has a contract with another physician, these medications should be received from that clinic or physician only).    Future labs/tests needed:  CBC, BMP in 1-2 weeks PCP

## 2017-02-02 ENCOUNTER — Other Ambulatory Visit (HOSPITAL_COMMUNITY): Payer: Self-pay | Admitting: Orthopedic Surgery

## 2017-02-02 ENCOUNTER — Ambulatory Visit (HOSPITAL_COMMUNITY)
Admission: RE | Admit: 2017-02-02 | Discharge: 2017-02-02 | Disposition: A | Discharge status: Home / Self Care | Payer: PPO | Source: Ambulatory Visit | Attending: Orthopedic Surgery | Admitting: Orthopedic Surgery

## 2017-02-02 DIAGNOSIS — M79605 Pain in left leg: Secondary | ICD-10-CM

## 2017-02-02 DIAGNOSIS — M7989 Other specified soft tissue disorders: Secondary | ICD-10-CM | POA: Diagnosis present

## 2017-02-02 NOTE — Progress Notes (Signed)
VASCULAR LAB PRELIMINARY  PRELIMINARY  PRELIMINARY  PRELIMINARY    Left lower extremity venous duplex completed.    Preliminary report: Left:  No evidence of DVT, superficial thrombosis, or Baker's cyst.  Malissia Rabbani, RVS 02/02/2017, 3:04 PM             .

## 2017-02-07 DIAGNOSIS — Z7409 Other reduced mobility: Secondary | ICD-10-CM | POA: Diagnosis not present

## 2017-02-10 DIAGNOSIS — Z7409 Other reduced mobility: Secondary | ICD-10-CM | POA: Diagnosis not present

## 2017-02-13 ENCOUNTER — Ambulatory Visit (INDEPENDENT_AMBULATORY_CARE_PROVIDER_SITE_OTHER): Payer: PPO | Admitting: Neurology

## 2017-02-13 ENCOUNTER — Encounter: Payer: Self-pay | Admitting: Neurology

## 2017-02-13 VITALS — BP 157/83 | HR 100 | Ht 63.0 in | Wt 171.0 lb

## 2017-02-13 DIAGNOSIS — B0229 Other postherpetic nervous system involvement: Secondary | ICD-10-CM

## 2017-02-13 NOTE — Progress Notes (Signed)
Reason for visit: Postherpetic neuralgia  Eulla Kochanowski Nolting is an 83 y.o. female  History of present illness:  Ms. Mervin is an 83 year old right-handed white female with a history of right V1 distribution post herpetic neuralgia. The patient is well controlled on Trileptal taking 150 mg twice daily. The patient unfortunately fell and broke her hip on the left on 01/11/2017. This required surgery, the patient is still getting physical therapy. The patient is using a walker for ambulation. She continues to complain of numbness of both feet going up the legs to the knees, she feels increased discomfort in the feet when she takes her shoes off. She has undergone nerve conduction studies that did not confirm a peripheral neuropathy, it is possible she may have a small fiber neuropathy. The patient does report some low back pain. The patient does have gait instability. She has had some swelling in the legs, venous Doppler studies have been done recently, and she claims they were unremarkable.  Past Medical History:  Diagnosis Date  . Arthritis   . Diarrhea    "sometimes ., due to Gallbladder removed"  . High cholesterol   . History of shingles    effecrt nerve sees neurologist in [facility]  . Hypertension   . Post herpetic neuralgia 08/15/2016   Right V1  . Shingles     Past Surgical History:  Procedure Laterality Date  . ABDOMINAL HYSTERECTOMY     COMPLETE  . CARPAL TUNNEL RELEASE Bilateral   . CHOLECYSTECTOMY    . EYE SURGERY     Cornea  . FEMUR IM NAIL Left 01/12/2017   Procedure: INTRAMEDULLARY (IM) NAIL FEMORAL LEFT;  Surgeon: Rod Can, MD;  [facility];  Service: Orthopedics;  Laterality: Left;  Marland Kitchen GAS INSERTION Right 01/27/2015   Procedure: INSERTION OF GAS;  Surgeon: Hayden Pedro, MD;  Location: [facility];  Service: Ophthalmology;  Laterality: Right;  C3F8  . JOINT REPLACEMENT Right 4 YRS AGO   KNEE  . KNEE ARTHROSCOPY Bilateral   . MEMBRANE PEEL Right 01/27/2015   Procedure: MEMBRANE PEEL;  Surgeon: Hayden Pedro, MD;  Location: [facility];  Service: Ophthalmology;  Laterality: Right;  . PERFLUORONE INJECTION Right 01/27/2015   Procedure: PERFLUORONE INJECTION;  Surgeon: Hayden Pedro, MD;  Location: [facility];  Service: Ophthalmology;  Laterality: Right;  . PHOTOCOAGULATION WITH LASER Right 01/27/2015   Procedure: PHOTOCOAGULATION WITH LASER;  Surgeon: Hayden Pedro, MD;  Location: [facility];  Service: Ophthalmology;  Laterality: Right;  ENDOLASER  . 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 TOTAL KNEE ARTHROPLASTY Left 12/17/2015   Procedure: LEFT TOTAL KNEE ARTHROPLASTY;  Surgeon: Susa Day, MD;  [facility];  Service: Orthopedics;  Laterality: Left;  Marland Kitchen VITRECTOMY 25 GAUGE WITH SCLERAL BUCKLE Right 01/27/2015   Procedure: VITRECTOMY 25 GAUGE WITH SCLERAL BUCKLE;  Surgeon: Hayden Pedro, MD;  Location: [facility];  Service: Ophthalmology;  Laterality: Right;    Family History  Problem Relation Age of Onset  . Heart failure Mother   . Cancer - Prostate Father   . Heart disease Brother   . Cancer Sister   . Cancer Brother     Social history:  reports that she has never smoked. She has never used smokeless tobacco. She reports that she does not drink alcohol or use  drugs.    Allergies  Allergen Reactions  . Cymbalta [Duloxetine Hcl]     Made her very sick (flu-like symptoms)  . Tape     Pulled skin off. Paper tape is ok.   . Codeine Rash    Medications:  Prior to Admission medications   Medication Sig Start Date End Date Taking? Authorizing Provider  aspirin EC 81 MG EC tablet Take 1 tablet (81 mg total) by mouth 2 (two) times daily with a meal. 01/13/17  Yes Swinteck, Aaron Edelman, MD  calcium carbonate (OS-CAL) 600 MG TABS Take 600 mg by mouth daily.   Yes [provider]  cyanocobalamin 500 MCG tablet Take 500 mcg by mouth 2 (two) times daily.   Yes [provider]  cycloSPORINE (RESTASIS) 0.05 % ophthalmic emulsion Place 1 drop into both eyes 2 (two) times daily.   Yes [provider]  docusate sodium (COLACE) 100 MG capsule Take 100 mg by mouth 2 (two) times daily as needed.    Yes [provider]  HYDROcodone-acetaminophen (NORCO/VICODIN) 5-325 MG tablet Take 1-2 tablets by mouth every 6 (six) hours as needed for moderate pain. 01/13/17  Yes Swinteck, Aaron Edelman, MD  lovastatin (MEVACOR) 40 MG tablet take 1 tablet once a day orally 90 days 07/29/16  Yes [provider]  methocarbamol (ROBAXIN) 500 MG tablet Take 500 mg by mouth every 6 (six) hours as needed for muscle spasms.   Yes [provider]  metoprolol tartrate (LOPRESSOR) 25 MG tablet Take 25 mg by mouth 2 (two) times daily. 05/23/16  Yes [provider]  Misc Natural Products (TART CHERRY ADVANCED PO) Take by mouth daily.   Yes [provider]  omeprazole (PRILOSEC) 20 MG capsule Take 20 mg by mouth daily.   Yes [provider]  OXcarbazepine (TRILEPTAL) 150 MG tablet Take 1 tablet (150 mg total) by mouth 2 (two) times daily. 08/15/16  Yes Kathrynn Ducking, MD  Prenatal Vit-Fe Fumarate-FA (PRENATAL MULTIVITAMIN) TABS Take 1 tablet by mouth every morning.    Yes [provider]  sennosides-docusate sodium (SENOKOT-S) 8.6-50 MG tablet Take 2 tablets by mouth as needed.    Yes [provider]  valsartan (DIOVAN) 160 MG tablet Take 160 mg by mouth at bedtime.    Yes [provider]    ROS:  Out of a complete 14 system review of symptoms, the patient complains only of the following symptoms, and all other reviewed systems are negative.  Foot numbness Gait disturbance  Blood pressure (!) 157/83, pulse 100, height 5\' 3"  (1.6 m), weight 171 lb (77.6 kg).  Physical Exam  General: The patient is alert and  cooperative at the time of the examination.  Skin: 1-2+ edema below the knees is seen bilaterally.   Neurologic Exam  Mental status: The patient is alert and oriented x 3 at the time of the examination. The patient has apparent normal recent and remote memory, with an apparently normal attention span and concentration ability.   Cranial nerves: Facial symmetry is present. Speech is normal, no aphasia or dysarthria is noted. Extraocular movements are full. Visual fields are full.  Motor: The patient has good strength in all 4 extremities.  Sensory examination: Soft touch sensation is symmetric on the face, arms, and legs.  Coordination: The patient has good finger-nose-finger and heel-to-shin bilaterally.  Gait and station: The patient has a slightly wide-based, unsteady gait. The patient uses a walker for ambulation. Tandem gait was not attempted. Romberg is  negative. No drift is seen.  Reflexes: Deep tendon reflexes are symmetric.   Assessment/Plan:  1. Foot numbness, possible peripheral neuropathy  2. Right V1 distribution postherpetic neuralgia  The patient may have small fiber neuropathy, but lumbosacral spinal stenosis cannot be excluded. I discussed the possibility of getting MRI evaluation of the lumbar spine, she does not wish to pursue this testing procedure at this time. We discussed going on another medication such as low-dose gabapentin or Lyrica for the leg discomfort, she does not wish to consider addition of a medication now. She will contact our office if she changes her mind. Will follow-up in 6 months otherwise.  Jill Alexanders MD 02/13/2017 3:11 PM  [facility] [address] [place], [place] [address]  Phone [phone] Fax [phone]

## 2017-02-13 NOTE — Patient Instructions (Signed)
   We will consider treatment of the foot discomfort with gabapentin or Lyrica. Call if you wish to schedule MRI evaluation of the low back.

## 2017-02-14 DIAGNOSIS — S72145D Nondisplaced intertrochanteric fracture of left femur, subsequent encounter for closed fracture with routine healing: Secondary | ICD-10-CM | POA: Diagnosis not present

## 2017-02-14 DIAGNOSIS — Z7409 Other reduced mobility: Secondary | ICD-10-CM | POA: Diagnosis not present

## 2017-02-17 DIAGNOSIS — Z7409 Other reduced mobility: Secondary | ICD-10-CM | POA: Diagnosis not present

## 2017-02-28 DIAGNOSIS — Z96652 Presence of left artificial knee joint: Secondary | ICD-10-CM | POA: Diagnosis not present

## 2017-02-28 DIAGNOSIS — R269 Unspecified abnormalities of gait and mobility: Secondary | ICD-10-CM | POA: Diagnosis not present

## 2017-02-28 DIAGNOSIS — Z471 Aftercare following joint replacement surgery: Secondary | ICD-10-CM | POA: Diagnosis not present

## 2017-03-07 DIAGNOSIS — I10 Essential (primary) hypertension: Secondary | ICD-10-CM | POA: Diagnosis not present

## 2017-03-07 DIAGNOSIS — E871 Hypo-osmolality and hyponatremia: Secondary | ICD-10-CM | POA: Diagnosis not present

## 2017-03-07 DIAGNOSIS — S72002D Fracture of unspecified part of neck of left femur, subsequent encounter for closed fracture with routine healing: Secondary | ICD-10-CM | POA: Diagnosis not present

## 2017-03-07 DIAGNOSIS — E7800 Pure hypercholesterolemia, unspecified: Secondary | ICD-10-CM | POA: Diagnosis not present

## 2017-03-07 DIAGNOSIS — S72002A Fracture of unspecified part of neck of left femur, initial encounter for closed fracture: Secondary | ICD-10-CM | POA: Diagnosis not present

## 2017-03-08 ENCOUNTER — Other Ambulatory Visit: Payer: Self-pay | Admitting: Dermatology

## 2017-03-08 DIAGNOSIS — L570 Actinic keratosis: Secondary | ICD-10-CM | POA: Diagnosis not present

## 2017-03-08 DIAGNOSIS — L309 Dermatitis, unspecified: Secondary | ICD-10-CM | POA: Diagnosis not present

## 2017-03-08 DIAGNOSIS — D492 Neoplasm of unspecified behavior of bone, soft tissue, and skin: Secondary | ICD-10-CM | POA: Diagnosis not present

## 2017-03-30 DIAGNOSIS — I6789 Other cerebrovascular disease: Secondary | ICD-10-CM | POA: Diagnosis not present

## 2017-03-30 DIAGNOSIS — R197 Diarrhea, unspecified: Secondary | ICD-10-CM | POA: Diagnosis not present

## 2017-03-30 DIAGNOSIS — H532 Diplopia: Secondary | ICD-10-CM | POA: Diagnosis not present

## 2017-03-30 DIAGNOSIS — I10 Essential (primary) hypertension: Secondary | ICD-10-CM | POA: Diagnosis not present

## 2017-03-30 DIAGNOSIS — H538 Other visual disturbances: Secondary | ICD-10-CM | POA: Diagnosis not present

## 2017-03-30 DIAGNOSIS — I447 Left bundle-branch block, unspecified: Secondary | ICD-10-CM | POA: Diagnosis not present

## 2017-03-30 DIAGNOSIS — R110 Nausea: Secondary | ICD-10-CM | POA: Diagnosis not present

## 2017-04-03 ENCOUNTER — Telehealth: Payer: Self-pay | Admitting: Neurology

## 2017-04-03 NOTE — Telephone Encounter (Signed)
Patient's daughter calling. Patient was seen tn the ED in [place] with double vision which lasted about 3-5 minutes on 03-30-17.Marland Kitchen She was diagnosed with an unknown migraine and the hospital says patient should follow up with her neurologist. Patient's daughter says she will follow up with patient's eye doctor first and will call back if needed for appointment with Dr. Jannifer Franklin.

## 2017-04-03 NOTE — Telephone Encounter (Signed)
Noted. Unlikely that ophthalmologic evaluation will delineate cause of double vision. We can work the patient and if they desire.

## 2017-04-07 DIAGNOSIS — H5310 Unspecified subjective visual disturbances: Secondary | ICD-10-CM | POA: Diagnosis not present

## 2017-05-05 DIAGNOSIS — H1789 Other corneal scars and opacities: Secondary | ICD-10-CM | POA: Diagnosis not present

## 2017-05-05 DIAGNOSIS — Z961 Presence of intraocular lens: Secondary | ICD-10-CM | POA: Diagnosis not present

## 2017-05-05 DIAGNOSIS — H524 Presbyopia: Secondary | ICD-10-CM | POA: Diagnosis not present

## 2017-06-06 DIAGNOSIS — H532 Diplopia: Secondary | ICD-10-CM | POA: Diagnosis not present

## 2017-06-06 DIAGNOSIS — H6983 Other specified disorders of Eustachian tube, bilateral: Secondary | ICD-10-CM | POA: Diagnosis not present

## 2017-06-06 DIAGNOSIS — J300 Vasomotor rhinitis: Secondary | ICD-10-CM | POA: Diagnosis not present

## 2017-06-08 ENCOUNTER — Encounter: Payer: Self-pay | Admitting: Neurology

## 2017-06-08 ENCOUNTER — Ambulatory Visit (INDEPENDENT_AMBULATORY_CARE_PROVIDER_SITE_OTHER): Payer: PPO | Admitting: Neurology

## 2017-06-08 VITALS — BP 153/66 | HR 68 | Ht 63.0 in | Wt 169.5 lb

## 2017-06-08 DIAGNOSIS — H532 Diplopia: Secondary | ICD-10-CM | POA: Diagnosis not present

## 2017-06-08 DIAGNOSIS — B0229 Other postherpetic nervous system involvement: Secondary | ICD-10-CM | POA: Diagnosis not present

## 2017-06-08 DIAGNOSIS — E538 Deficiency of other specified B group vitamins: Secondary | ICD-10-CM | POA: Diagnosis not present

## 2017-06-08 DIAGNOSIS — Z5181 Encounter for therapeutic drug level monitoring: Secondary | ICD-10-CM

## 2017-06-08 DIAGNOSIS — G609 Hereditary and idiopathic neuropathy, unspecified: Secondary | ICD-10-CM | POA: Diagnosis not present

## 2017-06-08 NOTE — Patient Instructions (Signed)
   We will check MRI of the brain and get blood work today. 

## 2017-06-08 NOTE — Progress Notes (Signed)
Reason for visit: Postherpetic neuralgia  Terri Liu is an 83 y.o. female  History of present illness:  Terri Liu is an 83 year old right-handed white female with a history of postherpetic neuralgia in the right V1 distribution. The patient also has neuropathic type pain in the feet, possibly a small fiber neuropathy. The patient comes to the office today with a new problem. In July 2018 she was in [place], she began having episodes of double vision. The patient has had episodes of double vision since that time that are vertical in nature and are quite brief. The patient may note some blurring of vision and then overt double vision that is vertical in nature, if she blinks her eyes, the double vision goes away. She denies any droopiness of the eyelids, she denies any problems with weakness of the extremities, or difficulty with swallowing or change in speech. The patient does not have episodes daily. She has some chronic gait instability, she has not had any recent falls, she did fall in May 2018 and broke her left hip. The patient does have some problem increased fatigue lately. She comes to the office today for an evaluation of the double vision.  Past Medical History:  Diagnosis Date  . Arthritis   . Diarrhea    "sometimes ., due to Gallbladder removed"  . High cholesterol   . History of shingles    effecrt nerve sees neurologist in [facility]  . Hypertension   . Post herpetic neuralgia 08/15/2016   Right V1  . Shingles     Past Surgical History:  Procedure Laterality Date  . ABDOMINAL HYSTERECTOMY     COMPLETE  . CARPAL TUNNEL RELEASE Bilateral   . CHOLECYSTECTOMY    . EYE SURGERY     Cornea  . FEMUR IM NAIL Left 01/12/2017   Procedure: INTRAMEDULLARY (IM) NAIL FEMORAL LEFT;  Surgeon: Rod Can, MD;  Location: [facility];  Service: Orthopedics;  Laterality: Left;  Marland Kitchen GAS INSERTION Right 01/27/2015   Procedure: INSERTION OF GAS;  Surgeon: Hayden Pedro, MD;   Location: [place];  Service: Ophthalmology;  Laterality: Right;  C3F8  . JOINT REPLACEMENT Right 4 YRS AGO   KNEE  . KNEE ARTHROSCOPY Bilateral   . MEMBRANE PEEL Right 01/27/2015   Procedure: MEMBRANE PEEL;  Surgeon: Hayden Pedro, MD;  Location: [place];  Service: Ophthalmology;  Laterality: Right;  . PERFLUORONE INJECTION Right 01/27/2015   Procedure: PERFLUORONE INJECTION;  Surgeon: Hayden Pedro, MD;  Location: [place];  Service: Ophthalmology;  Laterality: Right;  . PHOTOCOAGULATION WITH LASER Right 01/27/2015   Procedure: PHOTOCOAGULATION WITH LASER;  Surgeon: Hayden Pedro, MD;  Location: [place];  Service: Ophthalmology;  Laterality: Right;  ENDOLASER  . 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   Procedure: REPAIR OF COMPLEX TRACTION RETINAL DETACHMENT;  Surgeon: Hayden Pedro, MD;  Location: [place];  Service: Ophthalmology;  Laterality: Right;  . TOTAL KNEE ARTHROPLASTY Left 12/17/2015   Procedure: LEFT TOTAL KNEE ARTHROPLASTY;  Surgeon: Susa Day, MD;  [facility];  Service: Orthopedics;  Laterality: Left;  Marland Kitchen VITRECTOMY 25 GAUGE WITH SCLERAL BUCKLE Right 01/27/2015   Procedure: VITRECTOMY 25 GAUGE WITH SCLERAL BUCKLE;  Surgeon: Hayden Pedro, MD;  Location: [place];  Service: Ophthalmology;  Laterality: Right;    Family History  Problem Relation Age of Onset  . Heart failure Mother   . Cancer - Prostate Father   . Heart disease Brother   .  Cancer Sister   . Cancer Brother     Social history:  reports that she has never smoked. She has never used smokeless tobacco. She reports that she does not drink alcohol or use drugs.    Allergies  Allergen Reactions  . Cymbalta [Duloxetine Hcl]     Made her very sick (flu-like symptoms)  . Tape     Pulled skin off. Paper tape is ok.   . Codeine Rash    Medications:  Prior to Admission medications   Medication Sig Start Date End Date Taking? Authorizing Provider  aspirin EC 81 MG EC tablet Take 1 tablet  (81 mg total) by mouth 2 (two) times daily with a meal. 01/13/17   Swinteck, Aaron Edelman, MD  calcium carbonate (OS-CAL) 600 MG TABS Take 600 mg by mouth daily.    [provider]  cyanocobalamin 500 MCG tablet Take 500 mcg by mouth 2 (two) times daily.    [provider]  cycloSPORINE (RESTASIS) 0.05 % ophthalmic emulsion Place 1 drop into both eyes 2 (two) times daily.    [provider]  docusate sodium (COLACE) 100 MG capsule Take 100 mg by mouth 2 (two) times daily as needed.     [provider]  HYDROcodone-acetaminophen (NORCO/VICODIN) 5-325 MG tablet Take 1-2 tablets by mouth every 6 (six) hours as needed for moderate pain. 01/13/17   Swinteck, Aaron Edelman, MD  lovastatin (MEVACOR) 40 MG tablet take 1 tablet once a day orally 90 days 07/29/16   [provider]  methocarbamol (ROBAXIN) 500 MG tablet Take 500 mg by mouth every 6 (six) hours as needed for muscle spasms.    [provider]  metoprolol tartrate (LOPRESSOR) 25 MG tablet Take 25 mg by mouth 2 (two) times daily. 05/23/16   [provider]  Misc Natural Products (TART CHERRY ADVANCED PO) Take by mouth daily.    [provider]  omeprazole (PRILOSEC) 20 MG capsule Take 20 mg by mouth daily.    [provider]  OXcarbazepine (TRILEPTAL) 150 MG tablet Take 1 tablet (150 mg total) by mouth 2 (two) times daily. 08/15/16   Kathrynn Ducking, MD  Prenatal Vit-Fe Fumarate-FA (PRENATAL MULTIVITAMIN) TABS Take 1 tablet by mouth every morning.     [provider]  sennosides-docusate sodium (SENOKOT-S) 8.6-50 MG tablet Take 2 tablets by mouth as needed.     [provider]  valsartan (DIOVAN) 160 MG tablet Take 160 mg by mouth at bedtime.     [provider]    ROS:  Out of a complete 14 system review of symptoms, the patient complains only of the following symptoms, and all other reviewed systems are negative.  Fatigue Light sensitivity Back pain,  muscle cramps, walking difficulty Snoring Memory loss, dizziness  Blood pressure (!) 153/66, pulse 68, height 5\' 3"  (1.6 m), weight 169 lb 8 oz (76.9 kg).  Physical Exam  General: The patient is alert and cooperative at the time of the examination. The patient is moderately obese.  Skin: No significant peripheral edema is noted.   Neurologic Exam  Mental status: The patient is alert and oriented x 3 at the time of the examination. The patient has apparent normal recent and remote memory, with an apparently normal attention span and concentration ability.   Cranial nerves: Facial symmetry is present. Speech is normal, no aphasia or dysarthria is noted. Extraocular movements are full. Visual fields are full. Cover test does show some minimal bilateral movement of the eyes,  slightly horizontal in nature.  Motor: The patient has good strength in all 4 extremities.  Sensory examination: Soft touch sensation is symmetric on the face, arms, and legs.  Coordination: The patient has good finger-nose-finger and heel-to-shin bilaterally.  Gait and station: The patient has a slightly wide-based, unsteady gait. Tandem gait was not attempted. Romberg is negative. No drift is seen.  Reflexes: Deep tendon reflexes are symmetric.   Assessment/Plan:  1. Right V1 distribution postherpetic neuralgia  2. Peripheral neuropathy, gait disorder  3. Vertical double vision  The patient is having new symptoms of double vision that are quite brief and intermittent. The patient will be set up for MRI of the brain, she will have blood work done today. She will follow-up in 4 or 5 months. I will discuss results of the workup with the patient once they are available. The patient is on Trileptal, but she indicates that she has had recurrence of pain off the medication. She is on a low dose of this medication.  Jill Alexanders MD 06/08/2017 3:52 PM  [facility] [address] [place], [place] [address]  Phone [phone] Fax [phone]

## 2017-06-09 LAB — B. BURGDORFI ANTIBODIES

## 2017-06-09 LAB — COMPREHENSIVE METABOLIC PANEL
A/G RATIO: 2.2 (ref 1.2–2.2)
ALK PHOS: 81 IU/L (ref 39–117)
ALT: 16 IU/L (ref 0–32)
AST: 26 IU/L (ref 0–40)
Albumin: 4.6 g/dL (ref 3.5–4.7)
BILIRUBIN TOTAL: 0.8 mg/dL (ref 0.0–1.2)
BUN / CREAT RATIO: 16 (ref 12–28)
BUN: 9 mg/dL (ref 8–27)
CALCIUM: 9.5 mg/dL (ref 8.7–10.3)
CHLORIDE: 88 mmol/L — AB (ref 96–106)
CO2: 25 mmol/L (ref 20–29)
Creatinine, Ser: 0.55 mg/dL — ABNORMAL LOW (ref 0.57–1.00)
GFR, EST AFRICAN AMERICAN: 102 mL/min/{1.73_m2} (ref >59)
GFR, EST NON AFRICAN AMERICAN: 89 mL/min/{1.73_m2} (ref >59)
Globulin, Total: 2.1 g/dL (ref 1.5–4.5)
Glucose: 97 mg/dL (ref 65–99)
Potassium: 4.2 mmol/L (ref 3.5–5.2)
Sodium: 129 mmol/L — ABNORMAL LOW (ref 134–144)
Total Protein: 6.7 g/dL (ref 6.0–8.5)

## 2017-06-09 LAB — ANA W/REFLEX: ANA: NEGATIVE

## 2017-06-09 LAB — SEDIMENTATION RATE: Sed Rate: 11 mm/hr (ref 0–40)

## 2017-06-09 LAB — ANGIOTENSIN CONVERTING ENZYME: ANGIO CONVERT ENZYME: 50 U/L (ref 14–82)

## 2017-06-09 LAB — ACETYLCHOLINE RECEPTOR, BINDING

## 2017-06-09 LAB — VITAMIN B12: Vitamin B-12: 1888 pg/mL — ABNORMAL HIGH (ref 232–1245)

## 2017-06-09 LAB — RPR: RPR Ser Ql: NONREACTIVE

## 2017-06-14 ENCOUNTER — Telehealth: Payer: Self-pay

## 2017-06-14 NOTE — Telephone Encounter (Signed)
-----   Message from Marval Regal, RN sent at 06/12/2017  7:05 AM EDT -----   ----- Message ----- From: Kathrynn Ducking, MD Sent: 06/10/2017   6:48 PM To: Hope Pigeon, RN   The blood work results are unremarkable, with the exception that the sodium and chloride levels remain low on Trileptal.  Please call the paitient ----- Message ----- From: Interface, [facility] Lab Results In Sent: 06/09/2017   7:40 AM To: Kathrynn Ducking, MD

## 2017-06-14 NOTE — Telephone Encounter (Signed)
Left vm for patient to call back about lab work.

## 2017-06-19 NOTE — Telephone Encounter (Signed)
RN call patient about her lab work. Rn stated it was unremarkable. The sodium and chloride levels remain low on trileptal. Pt aware her sodium, and chloride were low. Pt verbalized understanding. ------

## 2017-06-22 ENCOUNTER — Inpatient Hospital Stay: Admission: RE | Admit: 2017-06-22 | Payer: PPO | Source: Ambulatory Visit

## 2017-06-23 ENCOUNTER — Ambulatory Visit
Admission: RE | Admit: 2017-06-23 | Discharge: 2017-06-23 | Disposition: A | Discharge status: Home / Self Care | Payer: PPO | Source: Ambulatory Visit | Attending: Neurology | Admitting: Neurology

## 2017-06-23 DIAGNOSIS — H532 Diplopia: Secondary | ICD-10-CM

## 2017-06-25 ENCOUNTER — Telehealth: Payer: Self-pay | Admitting: Neurology

## 2017-06-25 ENCOUNTER — Encounter: Payer: Self-pay | Admitting: Neurology

## 2017-06-25 NOTE — Telephone Encounter (Signed)
I tried to call the patient. The telephone number listed is disconnected. I will send her a letter. The MRI of the brain shows minimal white matter disease, no etiology of the double vision noted.  MRI brain 06/23/17:  IMPRESSION:  This MRI of the brain without contrast shows the following: 1.   T2/FLAIR hyperintense foci in the hemispheres consistent with mild chronic microvascular ischemic change. None of the foci appears to be acute. 2.    Brain volume is normal for age. 3.    No acute findings.

## 2017-06-27 NOTE — Telephone Encounter (Signed)
Placed letter in mail for pt. 

## 2017-07-05 DIAGNOSIS — Z13820 Encounter for screening for osteoporosis: Secondary | ICD-10-CM | POA: Diagnosis not present

## 2017-07-06 ENCOUNTER — Other Ambulatory Visit: Payer: Self-pay | Admitting: Nurse Practitioner

## 2017-07-06 DIAGNOSIS — Z23 Encounter for immunization: Secondary | ICD-10-CM | POA: Diagnosis not present

## 2017-07-06 DIAGNOSIS — I10 Essential (primary) hypertension: Secondary | ICD-10-CM | POA: Diagnosis not present

## 2017-07-06 DIAGNOSIS — H532 Diplopia: Secondary | ICD-10-CM | POA: Diagnosis not present

## 2017-07-06 DIAGNOSIS — E871 Hypo-osmolality and hyponatremia: Secondary | ICD-10-CM | POA: Diagnosis not present

## 2017-07-10 ENCOUNTER — Other Ambulatory Visit: Payer: Self-pay | Admitting: Neurology

## 2017-07-13 ENCOUNTER — Other Ambulatory Visit: Payer: PPO

## 2017-07-24 ENCOUNTER — Ambulatory Visit
Admission: RE | Admit: 2017-07-24 | Discharge: 2017-07-24 | Disposition: A | Discharge status: Home / Self Care | Payer: PPO | Source: Ambulatory Visit | Attending: Nurse Practitioner | Admitting: Nurse Practitioner

## 2017-07-24 ENCOUNTER — Ambulatory Visit (HOSPITAL_COMMUNITY): Payer: PPO

## 2017-07-24 DIAGNOSIS — I6523 Occlusion and stenosis of bilateral carotid arteries: Secondary | ICD-10-CM | POA: Diagnosis not present

## 2017-07-24 DIAGNOSIS — H532 Diplopia: Secondary | ICD-10-CM

## 2017-08-08 DIAGNOSIS — E7800 Pure hypercholesterolemia, unspecified: Secondary | ICD-10-CM | POA: Diagnosis not present

## 2017-08-08 DIAGNOSIS — R319 Hematuria, unspecified: Secondary | ICD-10-CM | POA: Diagnosis not present

## 2017-08-08 DIAGNOSIS — I10 Essential (primary) hypertension: Secondary | ICD-10-CM | POA: Diagnosis not present

## 2017-09-07 ENCOUNTER — Ambulatory Visit (HOSPITAL_COMMUNITY): Payer: PPO | Attending: Cardiovascular Disease

## 2017-09-07 ENCOUNTER — Other Ambulatory Visit: Payer: Self-pay

## 2017-09-07 DIAGNOSIS — E785 Hyperlipidemia, unspecified: Secondary | ICD-10-CM | POA: Diagnosis not present

## 2017-09-07 DIAGNOSIS — R42 Dizziness and giddiness: Secondary | ICD-10-CM | POA: Insufficient documentation

## 2017-09-07 DIAGNOSIS — H532 Diplopia: Secondary | ICD-10-CM | POA: Diagnosis not present

## 2017-09-07 LAB — ECHOCARDIOGRAM COMPLETE
AO mean calculated velocity dopler: 135 cm/s
AOPV: 0.49 m/s
AOVTI: 47.4 cm
AV Area VTI index: 0.87 cm2/m2
AV Area mean vel: 1.39 cm2
AV Peak grad: 16 mmHg
AV VEL mean LVOT/AV: 0.44
AV pk vel: 200 cm/s
AVAREAMEANVIN: 0.77 cm2/m2
AVAREAVTI: 1.55 cm2
AVG: 8 mmHg
AVPHT: 525 ms
CHL CUP AV PEAK INDEX: 0.86
CHL CUP AV VALUE AREA INDEX: 0.87
CHL CUP AV VEL: 1.56
CHL CUP DOP CALC LVOT VTI: 23.5 cm
CHL CUP MV DEC (S): 225
E/e' ratio: 4.35
EWDT: 225 ms
FS: 22 % — AB (ref 28–44)
IVS/LV PW RATIO, ED: 1.15
LA ID, A-P, ES: 30 mm
LADIAMINDEX: 1.67 cm/m2
LAVOL: 80 mL
LAVOLA4C: 60 mL
LAVOLIN: 44.4 mL/m2
LEFT ATRIUM END SYS DIAM: 30 mm
LVEEAVG: 4.35
LVEEMED: 4.35
LVELAT: 14.4 cm/s
LVOT area: 3.14 cm2
LVOT diameter: 20 mm
LVOT peak grad rest: 4 mmHg
LVOTPV: 98.7 cm/s
LVOTSV: 74 mL
LVOTVTI: 0.5 cm
MV pk A vel: 88.8 m/s
MV pk E vel: 62.7 m/s
PW: 12.5 mm — AB (ref 0.6–1.1)
RV LATERAL S' VELOCITY: 11.1 cm/s
TDI e' lateral: 14.4
TDI e' medial: 6.25
Valve area: 1.56 cm2

## 2017-09-19 ENCOUNTER — Encounter (HOSPITAL_COMMUNITY): Payer: Self-pay

## 2017-09-19 ENCOUNTER — Other Ambulatory Visit: Payer: Self-pay

## 2017-09-19 ENCOUNTER — Emergency Department (HOSPITAL_COMMUNITY): Payer: PPO

## 2017-09-19 ENCOUNTER — Emergency Department (HOSPITAL_COMMUNITY)
Admission: EM | Admit: 2017-09-19 | Discharge: 2017-09-20 | Disposition: A | Discharge status: Home / Self Care | Payer: PPO | Attending: Emergency Medicine | Admitting: Emergency Medicine

## 2017-09-19 DIAGNOSIS — M25552 Pain in left hip: Secondary | ICD-10-CM | POA: Diagnosis not present

## 2017-09-19 DIAGNOSIS — I10 Essential (primary) hypertension: Secondary | ICD-10-CM | POA: Insufficient documentation

## 2017-09-19 DIAGNOSIS — R109 Unspecified abdominal pain: Secondary | ICD-10-CM | POA: Diagnosis not present

## 2017-09-19 DIAGNOSIS — M545 Low back pain: Secondary | ICD-10-CM | POA: Diagnosis not present

## 2017-09-19 DIAGNOSIS — Z79899 Other long term (current) drug therapy: Secondary | ICD-10-CM | POA: Insufficient documentation

## 2017-09-19 DIAGNOSIS — M62830 Muscle spasm of back: Secondary | ICD-10-CM | POA: Diagnosis not present

## 2017-09-19 LAB — URINALYSIS, ROUTINE W REFLEX MICROSCOPIC
Bilirubin Urine: NEGATIVE
GLUCOSE, UA: NEGATIVE mg/dL
HGB URINE DIPSTICK: NEGATIVE
KETONES UR: NEGATIVE mg/dL
NITRITE: NEGATIVE
PROTEIN: NEGATIVE mg/dL
Specific Gravity, Urine: 1.012 (ref 1.005–1.030)
pH: 6 (ref 5.0–8.0)

## 2017-09-19 MED ORDER — CYCLOBENZAPRINE HCL 10 MG PO TABS
5.0000 mg | ORAL_TABLET | Freq: Once | ORAL | Status: AC
  Administered 2017-09-19: 5 mg via ORAL
  Filled 2017-09-19: qty 1

## 2017-09-19 MED ORDER — HYDROCODONE-ACETAMINOPHEN 5-325 MG PO TABS
1.0000 | ORAL_TABLET | Freq: Once | ORAL | Status: AC
  Administered 2017-09-19: 1 via ORAL
  Filled 2017-09-19: qty 1

## 2017-09-19 NOTE — ED Provider Notes (Signed)
[facility] DEPT Provider Note   CSN: [id number] Arrival date & time: 09/19/17  1405     History   Chief Complaint Chief Complaint  Patient presents with  . Hip Pain    L    HPI Terri Liu is a 84 y.o. female.  Patient is here for evaluation of progressive left flank/lower back pain x 5 days. She states she rolled over in bed during the night and felt pain. She went back to sleep and the pain continued when she got up from bed in the morning. She states she has continued her activity as usual but the pain is getting worse. She describes sharp, grabbing pain intermittently and relief of pain when lying down. No radiation of the pain into the lower extremities. No abdominal pain, nausea, vomiting or fever. She denies urinary symptoms or hematuria.    The history is provided by the patient and a relative. No language interpreter was used.  Hip Pain  Pertinent negatives include no chest pain, no abdominal pain and no shortness of breath.    Past Medical History:  Diagnosis Date  . Arthritis   . Diarrhea    "sometimes ., due to Gallbladder removed"  . High cholesterol   . History of shingles    effecrt nerve sees neurologist in [facility]  . Hypertension   . Post herpetic neuralgia 08/15/2016   Right V1  . Shingles     Patient Active Problem List   Diagnosis Date Noted  . Closed comminuted intertrochanteric fracture of left femur ([place]) 01/12/2017  . Closed intertrochanteric fracture, left, initial encounter ([place]) 01/11/2017  . Post herpetic neuralgia 08/15/2016  . Hereditary and idiopathic peripheral neuropathy 08/15/2016  . Primary osteoarthritis of left knee 12/17/2015  . Left knee DJD 12/17/2015  . 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 Epiretinal membrane 01/27/2015  . HYPERLIPIDEMIA 11/09/2006  . OBESITY, NOS 11/09/2006  . DEPRESSION, MAJOR, RECURRENT 11/09/2006    . NEUROGENIC BLADDER 11/09/2006  . HYPERTENSION, BENIGN SYSTEMIC 11/09/2006  . ARTHRITIS 11/09/2006    Past Surgical History:  Procedure Laterality Date  . ABDOMINAL HYSTERECTOMY     COMPLETE  . CARPAL TUNNEL RELEASE Bilateral   . CHOLECYSTECTOMY    . EYE SURGERY     Cornea  . FEMUR IM NAIL Left 01/12/2017   Procedure: INTRAMEDULLARY (IM) NAIL FEMORAL LEFT;  Surgeon: Rod Can, MD;  [facility];  Service: Orthopedics;  Laterality: Left;  Marland Kitchen GAS INSERTION Right 01/27/2015   Procedure: INSERTION OF GAS;  Surgeon: Hayden Pedro, MD;  Location: [place];  Service: Ophthalmology;  Laterality: Right;  C3F8  . JOINT REPLACEMENT Right 4 YRS AGO   KNEE  . KNEE ARTHROSCOPY Bilateral   . MEMBRANE PEEL Right 01/27/2015   Procedure: MEMBRANE PEEL;  Surgeon: Hayden Pedro, MD;  Location: [place];  Service: Ophthalmology;  Laterality: Right;  . PERFLUORONE INJECTION Right 01/27/2015   Procedure: PERFLUORONE INJECTION;  Surgeon: Hayden Pedro, MD;  Location: [place];  Service: Ophthalmology;  Laterality: Right;  . PHOTOCOAGULATION WITH LASER Right 01/27/2015   Procedure: PHOTOCOAGULATION WITH LASER;  Surgeon: Hayden Pedro, MD;  Location: [place];  Service: Ophthalmology;  Laterality: Right;  ENDOLASER  . 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   Procedure: REPAIR OF COMPLEX TRACTION RETINAL DETACHMENT;  Surgeon: Hayden Pedro, MD;  Location: [place];  Service: Ophthalmology;  Laterality: Right;  .  TOTAL KNEE ARTHROPLASTY Left 12/17/2015   Procedure: LEFT TOTAL KNEE ARTHROPLASTY;  Surgeon: Susa Day, MD;  [facility];  Service: Orthopedics;  Laterality: Left;  Marland Kitchen VITRECTOMY 25 GAUGE WITH SCLERAL BUCKLE Right 01/27/2015   Procedure: VITRECTOMY 25 GAUGE WITH SCLERAL BUCKLE;  Surgeon: Hayden Pedro, MD;  Location: [place];  Service: Ophthalmology;  Laterality: Right;    OB History    No data available       Home Medications    Prior to Admission medications    Medication Sig Start Date End Date Taking? Authorizing Provider  acetaminophen (TYLENOL) 325 MG tablet Take 650 mg by mouth every 6 (six) hours as needed for mild pain.   Yes [provider]  aspirin EC 81 MG EC tablet Take 1 tablet (81 mg total) by mouth 2 (two) times daily with a meal. 01/13/17  Yes Swinteck, Aaron Edelman, MD  cholecalciferol (VITAMIN D) 1000 units tablet Take 1,000 Units by mouth daily.   Yes [provider]  lovastatin (MEVACOR) 40 MG tablet take 1 tablet once a day orally 90 days 07/29/16  Yes [provider]  metoprolol tartrate (LOPRESSOR) 25 MG tablet Take 25 mg by mouth 2 (two) times daily. 05/23/16  Yes [provider]  OXcarbazepine (TRILEPTAL) 150 MG tablet Take 1 tablet (150 mg total) by mouth 2 (two) times daily. 07/10/17  Yes Kathrynn Ducking, MD  Prenatal Vit-Fe Fumarate-FA (PRENATAL MULTIVITAMIN) TABS Take 1 tablet by mouth every morning.    Yes [provider]  valsartan (DIOVAN) 160 MG tablet Take 160 mg by mouth daily.    Yes [provider]  HYDROcodone-acetaminophen (NORCO/VICODIN) 5-325 MG tablet Take 1-2 tablets by mouth every 6 (six) hours as needed for moderate pain. Patient not taking: Reported on 09/19/2017 01/13/17   Rod Can, MD    Family History Family History  Problem Relation Age of Onset  . Heart failure Mother   . Cancer - Prostate Father   . Heart disease Brother   . Cancer Sister   . Cancer Brother     Social History Social History   Tobacco Use  . Smoking status: Never Smoker  . Smokeless tobacco: Never Used  Substance Use Topics  . Alcohol use: No  . Drug use: No     Allergies   Cymbalta [duloxetine hcl]; Lipitor [atorvastatin]; Tape; and Codeine   Review of Systems Review of Systems  Constitutional: Negative for fever.  Respiratory: Negative for shortness of breath.   Cardiovascular: Negative for chest pain.  Gastrointestinal: Negative for abdominal pain and nausea.   Genitourinary: Positive for flank pain. Negative for decreased urine volume, dysuria and frequency.  Musculoskeletal: Positive for back pain.  Skin: Negative for rash.     Physical Exam Updated Vital Signs BP (!) 176/63 (BP Location: Right Arm)   Pulse 75   Temp 98.3 F (36.8 C) (Oral)   Resp 18   SpO2 100%   Physical Exam  Constitutional: She is oriented to person, place, and time. She appears well-developed and well-nourished. No distress.  Neck: Normal range of motion.  Cardiovascular: Normal rate.  Pulmonary/Chest: Effort normal. No respiratory distress.  Abdominal: Soft. She exhibits no distension and no mass. There is no tenderness. There is no guarding.  Musculoskeletal:       Back:  Left greater than right low back tenderness. No palpated spasm. No hip tenderness. FROM LE's without deficit in strength.  Neurological: She is alert and oriented to person, place, and  time.  Skin: Skin is warm and dry.     ED Treatments / Results  Labs (all labs ordered are listed, but only abnormal results are displayed) Labs Reviewed - No data to display  EKG  EKG Interpretation None       Radiology Dg Hip Unilat With Pelvis 2-3 Views Left  Result Date: 09/19/2017 CLINICAL DATA:  Severe left hip pain.  Recent fracture and repair. EXAM: DG HIP (WITH OR WITHOUT PELVIS) 2-3V LEFT COMPARISON:  01/11/2017 FINDINGS: Left intramedullary femoral nail with dynamic hip screw. Previously seen intertrochanteric fracture is healed. The hardware is well seated. No dislocation. No evidence of pelvic ring fracture. L4-5 advanced disc degeneration. Left L5-S1 facet arthropathy. Osteitis pubis. Osteopenia. IMPRESSION: No acute finding. Healed appearance of left intertrochanteric femur fracture that occurred in 2018. Electronically Signed   By: Monte Fantasia M.D.   On: 09/19/2017 15:24    Procedures Procedures (including critical care time)  Medications Ordered in ED Medications   cyclobenzaprine (FLEXERIL) tablet 5 mg (not administered)  HYDROcodone-acetaminophen (NORCO/VICODIN) 5-325 MG per tablet 1 tablet (not administered)     Initial Impression / Assessment and Plan / ED Course  I have reviewed the triage vital signs and the nursing notes.  Pertinent labs & imaging results that were available during my care of the patient were reviewed by me and considered in my medical decision making (see chart for details).     Patient with progressive persistent pain across lower back which follows a muscular pattern for spasm. Pain is improved here. She is stable otherwise, VSS, pain improved. Ambulatory with greater ease. Discussed with Dr. Alvino Chapel and is felt appropriate for discharge home.   Final Clinical Impressions(s) / ED Diagnoses   Final diagnoses:  None   1. Musculoskeletal pain 2. Muscle spasm of back  ED Discharge Orders    None       Charlann Lange, PA-C 09/20/17 2947    Davonna Belling, MD 09/20/17 [phone]

## 2017-09-19 NOTE — ED Triage Notes (Signed)
Pt reports L hip pain for the last 5 days. She denies any recent injury. Hx of L hip fracture and surgery last October without complication. A&Ox4. States that the pain is worse with changing positions. Pain woke her up this morning.

## 2017-09-20 MED ORDER — HYDROCODONE-ACETAMINOPHEN 5-325 MG PO TABS: 1.0000 | ORAL_TABLET | Freq: Four times a day (QID) | ORAL | 0 refills | Status: DC | PRN

## 2017-09-20 MED ORDER — CYCLOBENZAPRINE HCL 10 MG PO TABS
10.0000 mg | ORAL_TABLET | Freq: Two times a day (BID) | ORAL | 0 refills | Status: DC | PRN
Start: 2017-09-20 — End: 2020-04-14

## 2017-09-20 NOTE — ED Notes (Signed)
Signature pad is note working. Patient verbalized understanding of discharge instructions.

## 2017-09-21 LAB — URINE CULTURE: CULTURE: NO GROWTH

## 2017-09-22 DIAGNOSIS — M545 Low back pain: Secondary | ICD-10-CM | POA: Diagnosis not present

## 2017-09-22 DIAGNOSIS — M25552 Pain in left hip: Secondary | ICD-10-CM | POA: Diagnosis not present

## 2017-09-27 DIAGNOSIS — S72145D Nondisplaced intertrochanteric fracture of left femur, subsequent encounter for closed fracture with routine healing: Secondary | ICD-10-CM | POA: Diagnosis not present

## 2017-09-27 DIAGNOSIS — M545 Low back pain: Secondary | ICD-10-CM | POA: Diagnosis not present

## 2017-10-04 DIAGNOSIS — M545 Low back pain: Secondary | ICD-10-CM | POA: Diagnosis not present

## 2017-10-09 DIAGNOSIS — M545 Low back pain: Secondary | ICD-10-CM | POA: Diagnosis not present

## 2017-10-12 ENCOUNTER — Ambulatory Visit: Payer: PPO | Admitting: Neurology

## 2017-10-12 ENCOUNTER — Telehealth: Payer: Self-pay | Admitting: *Deleted

## 2017-10-12 NOTE — Telephone Encounter (Signed)
Pt cancel appt on same day 10/12/2017

## 2017-10-13 ENCOUNTER — Encounter: Payer: Self-pay | Admitting: Neurology

## 2017-10-16 DIAGNOSIS — M545 Low back pain: Secondary | ICD-10-CM | POA: Diagnosis not present

## 2017-10-25 DIAGNOSIS — M25562 Pain in left knee: Secondary | ICD-10-CM | POA: Diagnosis not present

## 2017-10-25 DIAGNOSIS — M5136 Other intervertebral disc degeneration, lumbar region: Secondary | ICD-10-CM | POA: Diagnosis not present

## 2017-10-25 DIAGNOSIS — M545 Low back pain: Secondary | ICD-10-CM | POA: Diagnosis not present

## 2017-10-25 DIAGNOSIS — M7542 Impingement syndrome of left shoulder: Secondary | ICD-10-CM | POA: Diagnosis not present

## 2017-10-25 DIAGNOSIS — M7062 Trochanteric bursitis, left hip: Secondary | ICD-10-CM | POA: Diagnosis not present

## 2017-11-07 DIAGNOSIS — M810 Age-related osteoporosis without current pathological fracture: Secondary | ICD-10-CM | POA: Diagnosis not present

## 2017-11-07 DIAGNOSIS — I10 Essential (primary) hypertension: Secondary | ICD-10-CM | POA: Diagnosis not present

## 2017-11-20 ENCOUNTER — Encounter (INDEPENDENT_AMBULATORY_CARE_PROVIDER_SITE_OTHER): Payer: Self-pay

## 2017-11-20 ENCOUNTER — Encounter: Payer: Self-pay | Admitting: Neurology

## 2017-11-20 ENCOUNTER — Ambulatory Visit: Payer: PPO | Admitting: Neurology

## 2017-11-20 VITALS — BP 168/67 | HR 71 | Ht 63.0 in | Wt 172.0 lb

## 2017-11-20 DIAGNOSIS — Z5181 Encounter for therapeutic drug level monitoring: Secondary | ICD-10-CM

## 2017-11-20 DIAGNOSIS — E871 Hypo-osmolality and hyponatremia: Secondary | ICD-10-CM

## 2017-11-20 NOTE — Progress Notes (Signed)
Reason for visit: Postherpetic neuralgia  Terri Liu is an 84 y.o. female  History of present illness:  Terri Liu is an 84 year old right-handed white female with a history of a right V1 distribution postherpetic neuralgia.  The patient has been on low-dose Trileptal, she has done well for a number of years.  The patient has had 2 brief episodes of sharp pain that lasted a second or 2 within the last 2 weeks.  The patient did not have any events last week.  The patient continues to have some numbness and swelling sensations in the feet when she takes off her shoes, she does not have any discomfort when the shoes are on her feet.  The patient more recently has been having some trouble with the left hip pain with standing and walking, she has seen Dr. Tonita Cong for this.  The patient has recently had MRI evaluation of the brain.  This was done because of vertical diplopia, she has not had any recurring events.  The MRI did not show any acute ischemic changes.  Blood work did not show evidence of myasthenia gravis.  The patient returns to this office for an evaluation.  Her blood work done in September did show hyponatremia on the Trileptal with the sodium level of 129.  Past Medical History:  Diagnosis Date  . Arthritis   . Diarrhea    "sometimes ., due to Gallbladder removed"  . High cholesterol   . History of shingles    effecrt nerve sees neurologist in [facility]  . Hypertension   . Post herpetic neuralgia 08/15/2016   Right V1  . Shingles     Past Surgical History:  Procedure Laterality Date  . ABDOMINAL HYSTERECTOMY     COMPLETE  . CARPAL TUNNEL RELEASE Bilateral   . CHOLECYSTECTOMY    . EYE SURGERY     Cornea  . FEMUR IM NAIL Left 01/12/2017   Procedure: INTRAMEDULLARY (IM) NAIL FEMORAL LEFT;  Surgeon: Rod Can, MD;  [facility]: [facility];  Service: Orthopedics;  Laterality: Left;  Marland Kitchen GAS INSERTION Right 01/27/2015   Procedure: INSERTION OF GAS;  Surgeon: Hayden Pedro, MD;   Location: [place];  Service: Ophthalmology;  Laterality: Right;  C3F8  . JOINT REPLACEMENT Right 4 YRS AGO   KNEE  . KNEE ARTHROSCOPY Bilateral   . MEMBRANE PEEL Right 01/27/2015   Procedure: MEMBRANE PEEL;  Surgeon: Hayden Pedro, MD;  Location: [place];  Service: Ophthalmology;  Laterality: Right;  . PERFLUORONE INJECTION Right 01/27/2015   Procedure: PERFLUORONE INJECTION;  Surgeon: Hayden Pedro, MD;  Location: [place];  Service: Ophthalmology;  Laterality: Right;  . PHOTOCOAGULATION WITH LASER Right 01/27/2015   Procedure: PHOTOCOAGULATION WITH LASER;  Surgeon: Hayden Pedro, MD;  Location: [place];  Service: Ophthalmology;  Laterality: Right;  ENDOLASER  . 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 TOTAL KNEE ARTHROPLASTY Left 12/17/2015   Procedure: LEFT TOTAL KNEE ARTHROPLASTY;  Surgeon: Susa Day, MD;  [facility];  Service: Orthopedics;  Laterality: Left;  Marland Kitchen VITRECTOMY 25 GAUGE WITH SCLERAL BUCKLE Right 01/27/2015   Procedure: VITRECTOMY 25 GAUGE WITH SCLERAL BUCKLE;  Surgeon: Hayden Pedro, MD;  Location: [place];  Service: Ophthalmology;  Laterality: Right;    Family History  Problem Relation Age of Onset  . Heart  failure Mother   . Cancer - Prostate Father   . Heart disease Brother   . Cancer Sister   . Cancer Brother     Social history:  reports that  has never smoked. she has never used smokeless tobacco. She reports that she does not drink alcohol or use drugs.    Allergies  Allergen Reactions  . Cymbalta [Duloxetine Hcl]     Made her very sick (flu-like symptoms)  . Lipitor [Atorvastatin] Other (See Comments)    Muscle pain in legs  . Tape     Pulled skin off. Paper tape is ok.   . Codeine Rash    Medications:  Prior to Admission medications   Medication Sig Start Date End Date  Taking? Authorizing Provider  acetaminophen (TYLENOL) 325 MG tablet Take 650 mg by mouth every 6 (six) hours as needed for mild pain.   Yes [provider]  aspirin EC 81 MG EC tablet Take 1 tablet (81 mg total) by mouth 2 (two) times daily with a meal. 01/13/17  Yes Swinteck, Aaron Edelman, MD  cholecalciferol (VITAMIN D) 1000 units tablet Take 1,000 Units by mouth daily.   Yes [provider]  cyclobenzaprine (FLEXERIL) 10 MG tablet Take 1 tablet (10 mg total) by mouth 2 (two) times daily as needed for muscle spasms. 09/20/17  Yes Charlann Lange, PA-C  HYDROcodone-acetaminophen (NORCO/VICODIN) 5-325 MG tablet Take 1 tablet by mouth every 6 (six) hours as needed for moderate pain. 09/20/17  Yes Charlann Lange, PA-C  lovastatin (MEVACOR) 40 MG tablet take 1 tablet once a day orally 90 days 07/29/16  Yes [provider]  metoprolol tartrate (LOPRESSOR) 25 MG tablet Take 25 mg by mouth 2 (two) times daily. 05/23/16  Yes [provider]  OXcarbazepine (TRILEPTAL) 150 MG tablet Take 1 tablet (150 mg total) by mouth 2 (two) times daily. 07/10/17  Yes Kathrynn Ducking, MD  Prenatal Vit-Fe Fumarate-FA (PRENATAL MULTIVITAMIN) TABS Take 1 tablet by mouth every morning.    Yes [provider]  valsartan (DIOVAN) 160 MG tablet Take 160 mg by mouth daily.    Yes [provider]    ROS:  Out of a complete 14 system review of symptoms, the patient complains only of the following symptoms, and all other reviewed systems are negative.  Neuralgia pain Left hip pain  Blood pressure (!) 168/67, pulse 71, height 5\' 3"  (1.6 m), weight 172 lb (78 kg).  Physical Exam  General: The patient is alert and cooperative at the time of the examination.  The patient is moderately obese.  Skin: No significant peripheral edema is noted.   Neurologic Exam  Mental status: The patient is alert and oriented x 3 at the time of the examination. The patient has apparent normal recent  and remote memory, with an apparently normal attention span and concentration ability.   Cranial nerves: Facial symmetry is present. Speech is normal, no aphasia or dysarthria is noted. Extraocular movements are full. Visual fields are full.  Motor: The patient has good strength in all 4 extremities.  Sensory examination: Soft touch sensation is symmetric on the face, arms, and legs.  Coordination: The patient has good finger-nose-finger and heel-to-shin bilaterally.  The patient does have difficulty with elevation of the left hip secondary to pain.  Gait and station: The patient has a normal gait. Romberg is negative. No drift is seen.  Reflexes: Deep tendon reflexes are symmetric.   MRI brain 06/23/17:  IMPRESSION: This MRI of the  brain without contrast shows the following: 1. T2/FLAIR hyperintense foci in the hemispheres consistent with mild chronic microvascular ischemic change. None of the foci appears to be acute. 2. Brain volume is normal for age. 3. No acute findings.  * MRI scan images were reviewed online. I agree with the written report.     Assessment/Plan:  1.  Postherpetic neuralgia, right V1 distribution  2.  Possible peripheral neuropathy  3.  Vertical diplopia, resolved  4.  Hyponatremia on Trileptal  The patient will have blood work done again today to look at the sodium levels.  The patient will continue on her current dose of Trileptal, if the patient has more frequent events of discomfort, the Trileptal dosing may need to be increased.  She will follow-up in 6 months.  Jill Alexanders MD 11/20/2017 10:36 AM  [facility] [address] [place], [place] [address]  Phone [phone] Fax [phone]

## 2017-11-21 ENCOUNTER — Telehealth: Payer: Self-pay | Admitting: *Deleted

## 2017-11-21 LAB — COMPREHENSIVE METABOLIC PANEL
A/G RATIO: 1.7 (ref 1.2–2.2)
ALBUMIN: 4.2 g/dL (ref 3.5–4.7)
ALT: 18 IU/L (ref 0–32)
AST: 24 IU/L (ref 0–40)
Alkaline Phosphatase: 76 IU/L (ref 39–117)
BUN/Creatinine Ratio: 14 (ref 12–28)
BUN: 8 mg/dL (ref 8–27)
Bilirubin Total: 0.8 mg/dL (ref 0.0–1.2)
CALCIUM: 9.7 mg/dL (ref 8.7–10.3)
CO2: 27 mmol/L (ref 20–29)
Chloride: 92 mmol/L — ABNORMAL LOW (ref 96–106)
Creatinine, Ser: 0.58 mg/dL (ref 0.57–1.00)
GFR, EST AFRICAN AMERICAN: 100 mL/min/{1.73_m2} (ref >59)
GFR, EST NON AFRICAN AMERICAN: 87 mL/min/{1.73_m2} (ref >59)
GLOBULIN, TOTAL: 2.5 g/dL (ref 1.5–4.5)
Glucose: 96 mg/dL (ref 65–99)
POTASSIUM: 4.6 mmol/L (ref 3.5–5.2)
SODIUM: 132 mmol/L — AB (ref 134–144)
TOTAL PROTEIN: 6.7 g/dL (ref 6.0–8.5)

## 2017-11-21 NOTE — Telephone Encounter (Signed)
Called and spoke with pt. Advised sodium level still low, but improved form last blood work. Pt verbalized understanding.

## 2017-11-21 NOTE — Telephone Encounter (Signed)
-----   Message from Kathrynn Ducking, MD sent at 11/21/2017  7:24 AM EDT ----- The sodium level is still low, but is improved from the last blood work. Please call the patient. ----- Message ----- From: Lavone Neri Lab Results In Sent: 11/21/2017   5:41 AM To: Kathrynn Ducking, MD

## 2017-11-22 DIAGNOSIS — S72145D Nondisplaced intertrochanteric fracture of left femur, subsequent encounter for closed fracture with routine healing: Secondary | ICD-10-CM | POA: Diagnosis not present

## 2017-11-22 DIAGNOSIS — M7062 Trochanteric bursitis, left hip: Secondary | ICD-10-CM | POA: Diagnosis not present

## 2017-12-06 DIAGNOSIS — M7062 Trochanteric bursitis, left hip: Secondary | ICD-10-CM | POA: Diagnosis not present

## 2017-12-14 DIAGNOSIS — M7062 Trochanteric bursitis, left hip: Secondary | ICD-10-CM | POA: Diagnosis not present

## 2017-12-21 DIAGNOSIS — M7062 Trochanteric bursitis, left hip: Secondary | ICD-10-CM | POA: Diagnosis not present

## 2017-12-28 DIAGNOSIS — M7062 Trochanteric bursitis, left hip: Secondary | ICD-10-CM | POA: Diagnosis not present

## 2018-01-04 ENCOUNTER — Other Ambulatory Visit: Payer: Self-pay | Admitting: *Deleted

## 2018-01-04 DIAGNOSIS — M7062 Trochanteric bursitis, left hip: Secondary | ICD-10-CM | POA: Diagnosis not present

## 2018-01-04 MED ORDER — OXCARBAZEPINE 150 MG PO TABS: 150.0000 mg | ORAL_TABLET | Freq: Two times a day (BID) | ORAL | 1 refills | Status: DC

## 2018-03-05 ENCOUNTER — Ambulatory Visit
Admission: RE | Admit: 2018-03-05 | Discharge: 2018-03-05 | Disposition: A | Discharge status: Home / Self Care | Payer: PPO | Source: Ambulatory Visit | Attending: Nurse Practitioner | Admitting: Nurse Practitioner

## 2018-03-05 ENCOUNTER — Other Ambulatory Visit: Payer: Self-pay | Admitting: Nurse Practitioner

## 2018-03-05 DIAGNOSIS — G8929 Other chronic pain: Secondary | ICD-10-CM | POA: Diagnosis not present

## 2018-03-05 DIAGNOSIS — M19011 Primary osteoarthritis, right shoulder: Secondary | ICD-10-CM | POA: Diagnosis not present

## 2018-03-05 DIAGNOSIS — M19012 Primary osteoarthritis, left shoulder: Secondary | ICD-10-CM | POA: Diagnosis not present

## 2018-03-05 DIAGNOSIS — E7800 Pure hypercholesterolemia, unspecified: Secondary | ICD-10-CM | POA: Diagnosis not present

## 2018-03-05 DIAGNOSIS — M25512 Pain in left shoulder: Secondary | ICD-10-CM

## 2018-03-05 DIAGNOSIS — I10 Essential (primary) hypertension: Secondary | ICD-10-CM | POA: Diagnosis not present

## 2018-03-05 DIAGNOSIS — N390 Urinary tract infection, site not specified: Secondary | ICD-10-CM | POA: Diagnosis not present

## 2018-03-05 DIAGNOSIS — M25511 Pain in right shoulder: Secondary | ICD-10-CM | POA: Diagnosis not present

## 2018-03-05 DIAGNOSIS — F419 Anxiety disorder, unspecified: Secondary | ICD-10-CM | POA: Diagnosis not present

## 2018-05-06 IMAGING — US US CAROTID DUPLEX BILAT
1 series · 13 of 24 positions shown · non-contrast
Comparison: None.

CLINICAL DATA: Hyperlipidemia, dizziness, headache

EXAM:
BILATERAL CAROTID DUPLEX ULTRASOUND
TECHNIQUE: Gray scale imaging, color Doppler and duplex ultrasound were
performed of bilateral carotid and vertebral arteries in the neck.

[Series 1: us carotid duplex bilat · 0.08mm/px · 13 of 75 slices shown]
[im 1/75]
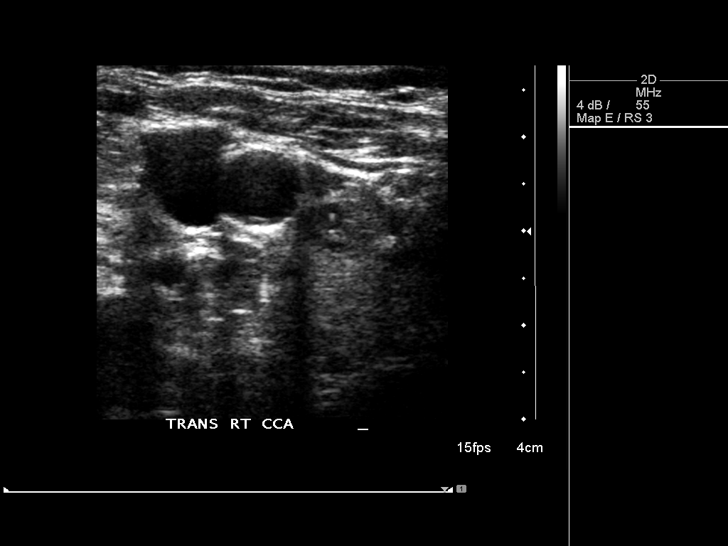
[im 7/75]
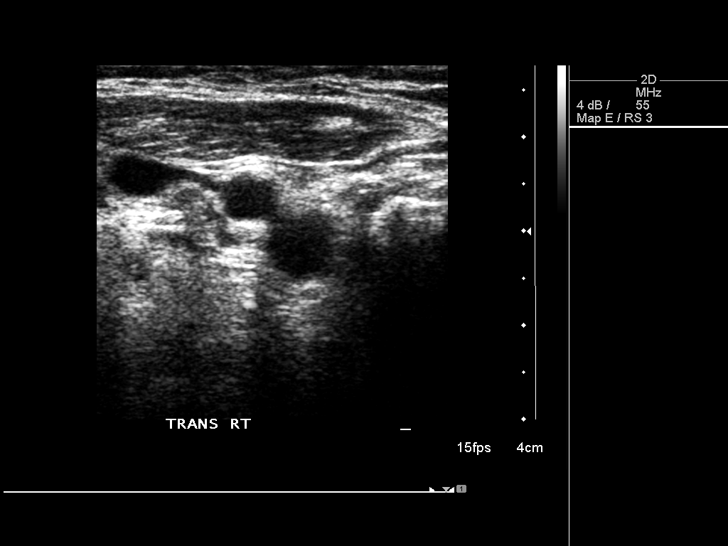
[im 13/75]
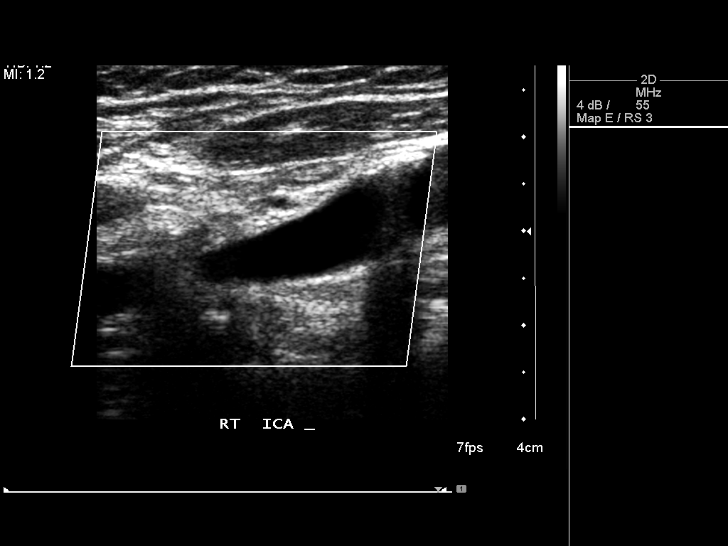
[im 20/75]
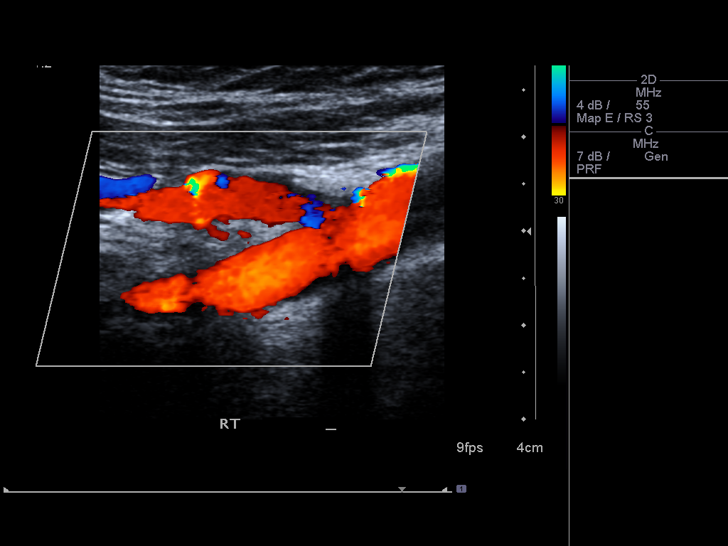
[im 26/75]
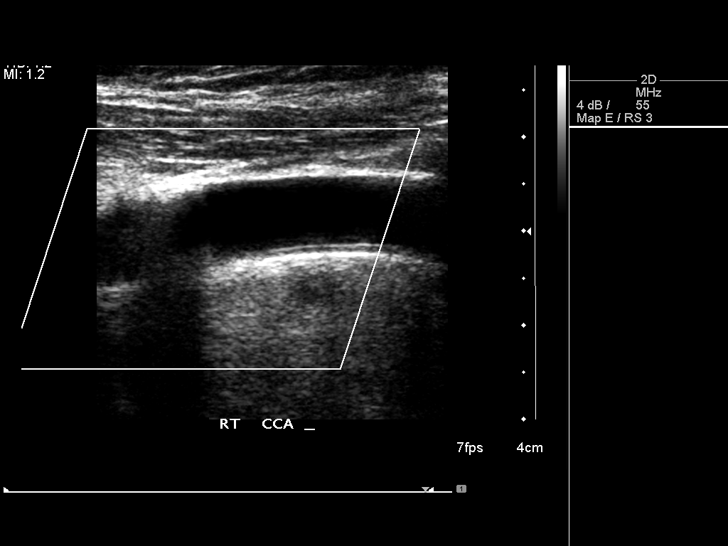
[im 33/75]
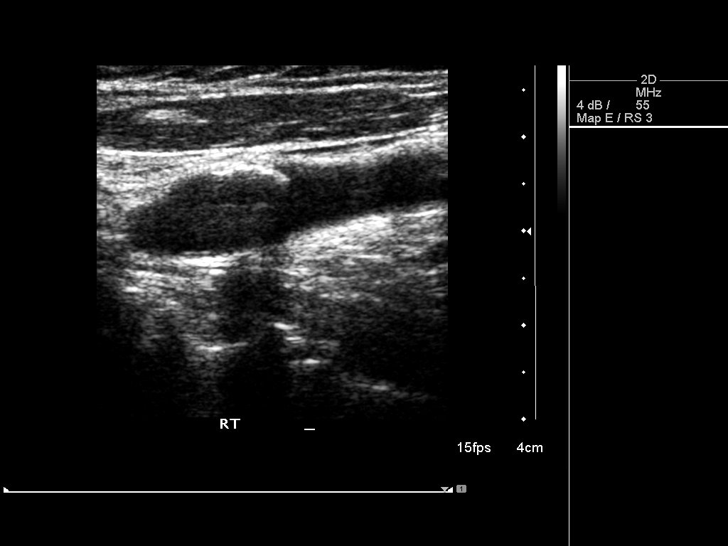
[im 39/75]
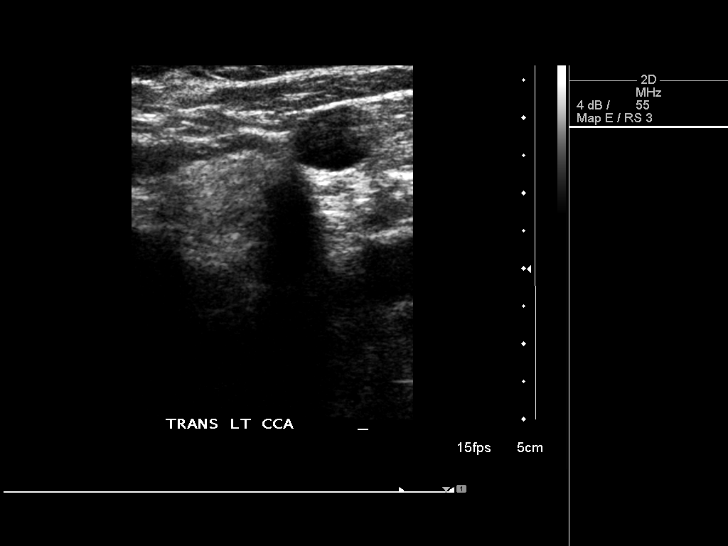
[im 42/75]
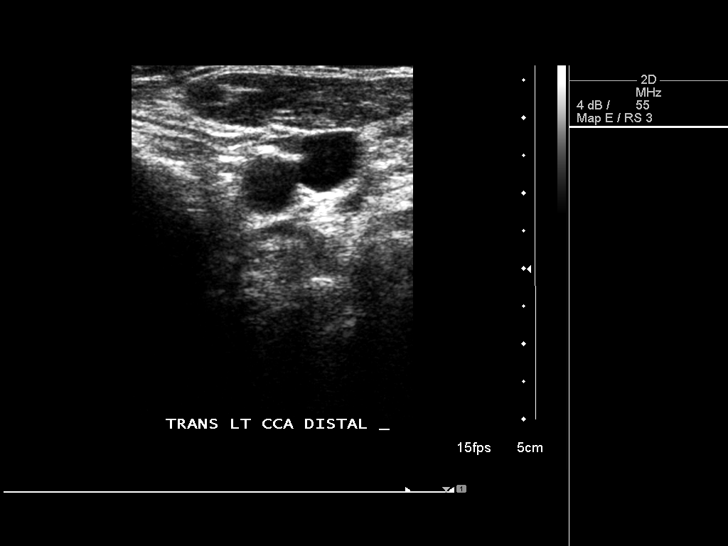
[im 49/75]
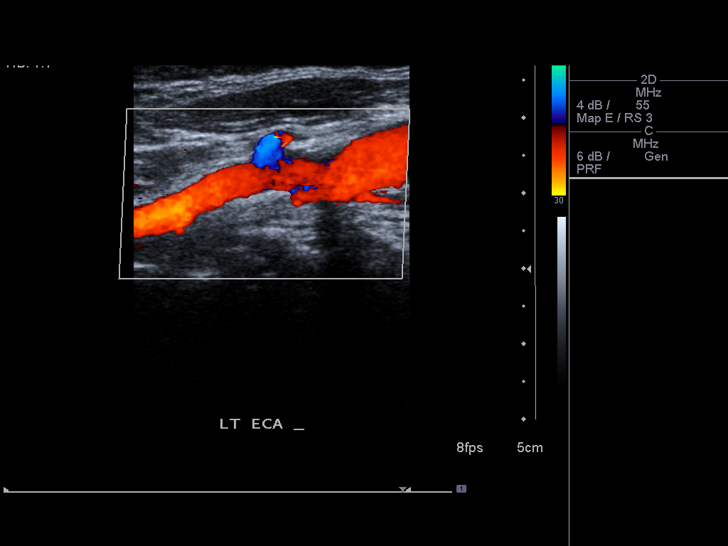
[im 55/75]
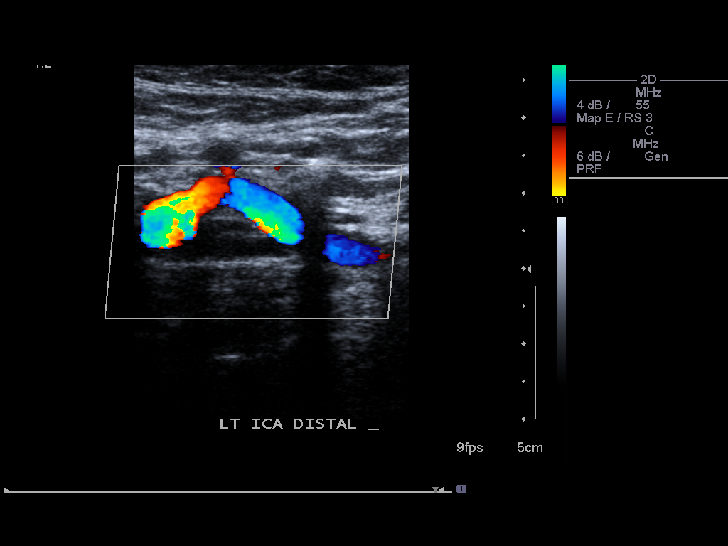
[im 62/75]
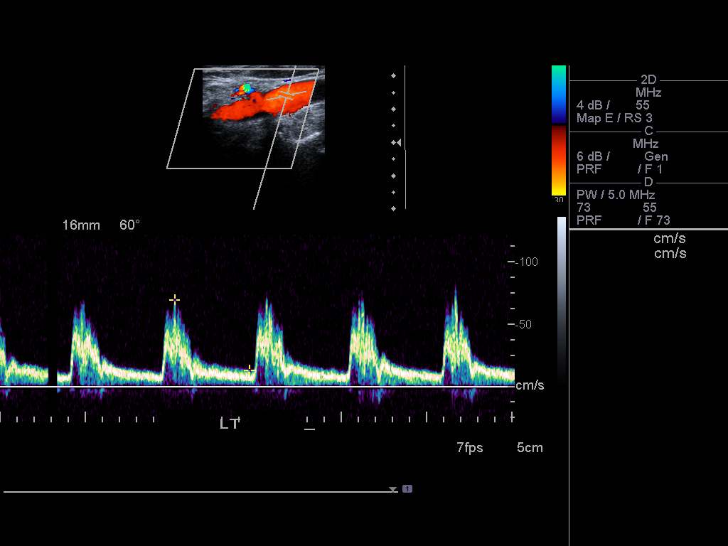
[im 68/75]
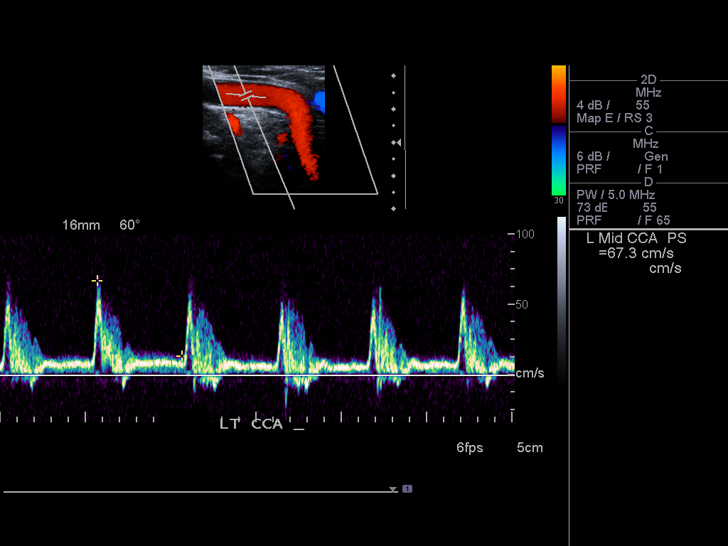
[im 75/75]
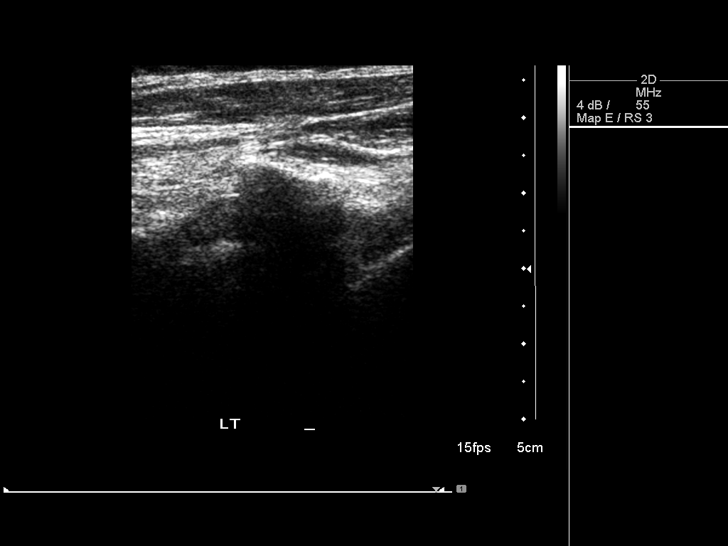

[13 of 24 positions shown; findings below may reference images not displayed]

FINDINGS: Criteria: Quantification of carotid stenosis is based on velocity
parameters that correlate the residual internal carotid diameter
with NASCET-based stenosis levels, using the diameter of the distal
internal carotid lumen as the denominator for stenosis measurement.

The following velocity measurements were obtained:

RIGHT

ICA:  77/19 cm/sec

CCA:  72/13 cm/sec

SYSTOLIC ICA/CCA RATIO:

DIASTOLIC ICA/CCA RATIO:

ECA:  65 cm/sec

LEFT

ICA:  132/32 cm/sec

CCA:  68/9 cm/sec

SYSTOLIC ICA/CCA RATIO:

DIASTOLIC ICA/CCA RATIO:

ECA:  100 cm/sec

RIGHT CAROTID ARTERY: Minor echogenic shadowing plaque formation. No
hemodynamically significant right ICA stenosis, velocity elevation,
or turbulent flow. Degree of narrowing less than 50%.

RIGHT VERTEBRAL ARTERY:  Antegrade

LEFT CAROTID ARTERY: Similar scattered minor echogenic plaque
formation. No hemodynamically significant left ICA stenosis,
velocity elevation, or turbulent flow.

LEFT VERTEBRAL ARTERY:  Antegrade
IMPRESSION: Minor carotid atherosclerosis. No hemodynamically significant ICA
stenosis. Degree of narrowing less than 50% bilaterally.

Patent antegrade vertebral flow bilaterally

## 2018-05-22 NOTE — Progress Notes (Signed)
[facility]  PATIENT: Terri Liu DOB: [birth year]   REASON FOR VISIT: Follow-up for postherpetic neuralgia HISTORY FROM: Patient    HISTORY OF PRESENT ILLNESS:UPDATE 9/11/2019CM Terri Liu, 84 year old female returns for follow-up with history of right V1 distribution postherpetic neuralgia.  She is currently on Trileptal 150 twice a day with good control of her symptoms.  She denies any episodes of sharp pain since last seen.  She has had hyponatremia on Trileptal, last sodium level in March 2019 was 132 which was improved.  She denies further episodes of diplopia.  Her MRI 06/24/17  has not shown any acute ischemic changes.  She denies any recent falls she has no new neurologic complaints   11/20/17 Terri Liu is an 84 year old right-handed white female with a history of a right V1 distribution postherpetic neuralgia.  The patient has been on low-dose Trileptal, she has done well for a number of years.  The patient has had 2 brief episodes of sharp pain that lasted a second or 2 within the last 2 weeks.  The patient did not have any events last week.  The patient continues to have some numbness and swelling sensations in the feet when she takes off her shoes, she does not have any discomfort when the shoes are on her feet.  The patient more recently has been having some trouble with the left hip pain with standing and walking, she has seen Dr. Tonita Cong for this.  The patient has recently had MRI evaluation of the brain.  This was done because of vertical diplopia, she has not had any recurring events.  The MRI did not show any acute ischemic changes.  Blood work did not show evidence of myasthenia gravis.  The patient returns to this office for an evaluation.  Her blood work done in September did show hyponatremia on the Trileptal with the sodium level of 129.  REVIEW OF SYSTEMS: Full 14 system review of systems performed and notable only for those listed, all others are neg:    Constitutional: neg  Cardiovascular: neg Ear/Nose/Throat: neg  Skin: neg Eyes: neg Respiratory: neg Gastroitestinal: neg  Hematology/Lymphatic: neg  Endocrine: neg Musculoskeletal:neg Allergy/Immunology: neg Neurological: neg Psychiatric: neg Sleep : neg   ALLERGIES: Allergies  Allergen Reactions  . Cymbalta [Duloxetine Hcl]     Made her very sick (flu-like symptoms)  . Lipitor [Atorvastatin] Other (See Comments)    Muscle pain in legs  . Tape     Pulled skin off. Paper tape is ok.   . Codeine Rash    HOME MEDICATIONS: Outpatient Medications Prior to Visit  Medication Sig Dispense Refill  . acetaminophen (TYLENOL) 325 MG tablet Take 650 mg by mouth every 6 (six) hours as needed for mild pain.    . Ascorbic Acid (VITAMIN C ER PO) Take by mouth.    Marland Kitchen aspirin EC 81 MG EC tablet Take 1 tablet (81 mg total) by mouth 2 (two) times daily with a meal. 60 tablet 1  . BIOTIN FORTE PO Take by mouth.    . cholecalciferol (VITAMIN D) 1000 units tablet Take 1,000 Units by mouth daily.    Marland Kitchen escitalopram (LEXAPRO) 5 MG tablet Take 5 mg by mouth daily.  0  . lovastatin (MEVACOR) 40 MG tablet take 1 tablet once a day orally 90 days  4  . metoprolol tartrate (LOPRESSOR) 25 MG tablet Take 25 mg by mouth 2 (two) times daily.  0  . Misc Natural Products (TART CHERRY ADVANCED  PO) Take by mouth.    . OXcarbazepine (TRILEPTAL) 150 MG tablet Take 1 tablet (150 mg total) by mouth 2 (two) times daily. 180 tablet 1  . Prenatal Vit-Fe Fumarate-FA (PRENATAL MULTIVITAMIN) TABS Take 1 tablet by mouth every morning.     . valsartan (DIOVAN) 160 MG tablet Take 160 mg by mouth daily.     . cyclobenzaprine (FLEXERIL) 10 MG tablet Take 1 tablet (10 mg total) by mouth 2 (two) times daily as needed for muscle spasms. (Patient not taking: Reported on 05/23/2018) 20 tablet 0  . HYDROcodone-acetaminophen (NORCO/VICODIN) 5-325 MG tablet Take 1 tablet by mouth every 6 (six) hours as needed for moderate pain.  (Patient not taking: Reported on 05/23/2018) 10 tablet 0   No facility-administered medications prior to visit.     PAST MEDICAL HISTORY: Past Medical History:  Diagnosis Date  . Arthritis   . Diarrhea    "sometimes ., due to Gallbladder removed"  . High cholesterol   . History of shingles    effecrt nerve sees neurologist in [facility]  . Hypertension   . Post herpetic neuralgia 08/15/2016   Right V1  . Shingles     PAST SURGICAL HISTORY: Past Surgical History:  Procedure Laterality Date  . ABDOMINAL HYSTERECTOMY     COMPLETE  . CARPAL TUNNEL RELEASE Bilateral   . CHOLECYSTECTOMY    . EYE SURGERY     Cornea  . FEMUR IM NAIL Left 01/12/2017   Procedure: INTRAMEDULLARY (IM) NAIL FEMORAL LEFT;  Surgeon: Rod Can, MD;  [facility];  Service: Orthopedics;  Laterality: Left;  Marland Kitchen GAS INSERTION Right 01/27/2015   Procedure: INSERTION OF GAS;  Surgeon: Hayden Pedro, MD;  Location: [place];  Service: Ophthalmology;  Laterality: Right;  C3F8  . JOINT REPLACEMENT Right 4 YRS AGO   KNEE  . KNEE ARTHROSCOPY Bilateral   . MEMBRANE PEEL Right 01/27/2015   Procedure: MEMBRANE PEEL;  Surgeon: Hayden Pedro, MD;  Location: [place];  Service: Ophthalmology;  Laterality: Right;  . PERFLUORONE INJECTION Right 01/27/2015   Procedure: PERFLUORONE INJECTION;  Surgeon: Hayden Pedro, MD;  Location: [place];  Service: Ophthalmology;  Laterality: Right;  . PHOTOCOAGULATION WITH LASER Right 01/27/2015   Procedure: PHOTOCOAGULATION WITH LASER;  Surgeon: Hayden Pedro, MD;  Location: [place];  Service: Ophthalmology;  Laterality: Right;  ENDOLASER  . 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 TOTAL KNEE ARTHROPLASTY Left 12/17/2015   Procedure: LEFT TOTAL KNEE ARTHROPLASTY;  Surgeon: Susa Day, MD;  [facility];   Service: Orthopedics;  Laterality: Left;  Marland Kitchen VITRECTOMY 25 GAUGE WITH SCLERAL BUCKLE Right 01/27/2015   Procedure: VITRECTOMY 25 GAUGE WITH SCLERAL BUCKLE;  Surgeon: Hayden Pedro, MD;  Location: [place];  Service: Ophthalmology;  Laterality: Right;    FAMILY HISTORY: Family History  Problem Relation Age of Onset  . Heart failure Mother   . Cancer - Prostate Father   . Heart disease Brother   . Cancer Sister   . Cancer Brother     SOCIAL HISTORY: Social History   Socioeconomic History  . Marital status: Married    Spouse name: Not on file  . Number of children: 3  . Years of education: 10  . Highest education level: Not on file  Occupational History  . Occupation: N/A  Social Needs  . Financial resource strain: Not on file  . Food insecurity:    Worry: Not on file    Inability: Not on file  . Transportation needs:    Medical: Not on file    Non-medical: Not on file  Tobacco Use  . Smoking status: Never Smoker  . Smokeless tobacco: Never Used  Substance and Sexual Activity  . Alcohol use: No  . Drug use: No  . Sexual activity: Not on file  Lifestyle  . Physical activity:    Days per week: Not on file    Minutes per session: Not on file  . Stress: Not on file  Relationships  . Social connections:    Talks on phone: Not on file    Gets together: Not on file    Attends religious service: Not on file    Active member of club or organization: Not on file    Attends meetings of clubs or organizations: Not on file    Relationship status: Not on file  . Intimate partner violence:    Fear of current or ex partner: Not on file    Emotionally abused: Not on file    Physically abused: Not on file    Forced sexual activity: Not on file  Other Topics Concern  . Not on file  Social History Narrative   Lives at home w/ her husband   Right-handed   Caffeine: 2 cups of half caf coffee each morning     PHYSICAL EXAM  Vitals:   05/23/18 1035  BP: (!) 156/74  Pulse:  62  Weight: 169 lb 12.8 oz (77 kg)  Height: 5\' 3"  (1.6 m)   Body mass index is 30.08 kg/m.  Generalized: Well developed, obese female in no acute distress  Head: normocephalic and atraumatic,. Oropharynx benign  Musculoskeletal: Arthritic changes in the hands Skin no significant peripheral edema Neurological examination   Mentation: Alert oriented to time, place, history taking. Attention span and concentration appropriate. Recent and remote memory intact.  Follows all commands speech and language fluent.   Cranial nerve II-XII: Pupils were equal round reactive to light extraocular movements were full, visual field were full on confrontational test. Facial sensation and strength were normal. hearing was intact to finger rubbing bilaterally. Uvula tongue midline. head turning and shoulder shrug were normal and symmetric.Tongue protrusion into cheek strength was normal. Motor: normal bulk and tone, full strength in the BUE, BLE,  Sensory: normal and symmetric to light touch,  Coordination: finger-nose-finger, heel-to-shin bilaterally, no dysmetria Reflexes: Symmetric upper and lower, plantar responses were flexor bilaterally. Gait and Station: Rising up from seated position without assistance, normal stance, no difficulty with turns, Romberg negative DIAGNOSTIC DATA (LABS, IMAGING, TESTING) - I reviewed patient records, labs, notes, testing and imaging myself where available.  Lab Results  Component Value Date   WBC 7.1 01/14/2017   HGB 10.4 (A) 01/23/2017   HCT 32 (A) 01/23/2017   MCV 93.0 01/14/2017   PLT 418 (A) 01/23/2017      Component Value Date/Time   NA 132 (L) 11/20/2017 1053   K 4.6 11/20/2017 1053   CL 92 (L) 11/20/2017 1053   CO2 27 11/20/2017 1053   GLUCOSE 96 11/20/2017 1053   GLUCOSE 129 (H) 01/14/2017 1237   BUN 8 11/20/2017 1053   CREATININE 0.58 11/20/2017 1053   CALCIUM 9.7 11/20/2017 1053   PROT 6.7 11/20/2017 1053   ALBUMIN 4.2 11/20/2017 1053   AST 24  11/20/2017 1053  ALT 18 11/20/2017 1053   ALKPHOS 76 11/20/2017 1053   BILITOT 0.8 11/20/2017 1053   GFRNONAA 87 11/20/2017 1053   GFRAA 100 11/20/2017 1053    Lab Results  Component Value Date   VITAMINB12 1,888 (H) 06/08/2017   Lab Results  Component Value Date   TSH 1.55 01/23/2017      ASSESSMENT AND PLAN  84 y.o. year old female  has a past medical history of Arthritis, Diarrhea, High cholesterol, History of shingles, Hypertension, Post herpetic neuralgia (08/15/2016), and Shingles. here to follow-up for Postherpetic neuralgia, right V1 distribution, hyponatremia on Trileptal    PLAN: Continue Trileptal at current dose will refill Check CMP today to follow hyponatremia on Trileptal Follow-up in 6 to 8 months Dennie Bible, [facility], [facility], [facility] [address], [place], [place] [address] [phone]

## 2018-05-23 ENCOUNTER — Encounter: Payer: Self-pay | Admitting: Nurse Practitioner

## 2018-05-23 ENCOUNTER — Ambulatory Visit (INDEPENDENT_AMBULATORY_CARE_PROVIDER_SITE_OTHER): Payer: PPO | Admitting: Nurse Practitioner

## 2018-05-23 VITALS — BP 156/74 | HR 62 | Ht 63.0 in | Wt 169.8 lb

## 2018-05-23 DIAGNOSIS — B0229 Other postherpetic nervous system involvement: Secondary | ICD-10-CM | POA: Diagnosis not present

## 2018-05-23 DIAGNOSIS — Z5181 Encounter for therapeutic drug level monitoring: Secondary | ICD-10-CM

## 2018-05-23 MED ORDER — OXCARBAZEPINE 150 MG PO TABS: 150.0000 mg | ORAL_TABLET | Freq: Two times a day (BID) | ORAL | 2 refills | Status: DC

## 2018-05-23 NOTE — Progress Notes (Signed)
I have read the note, and I agree with the clinical assessment and plan.  Shayden Bobier K Adela Esteban   

## 2018-05-23 NOTE — Patient Instructions (Addendum)
Continue Trileptal at current dose will refill Check CMP today to follow hyponatremia on Trileptal Follow-up in 6 to 8 months

## 2018-05-24 LAB — COMPREHENSIVE METABOLIC PANEL
ALBUMIN: 4.4 g/dL (ref 3.5–4.7)
ALK PHOS: 51 IU/L (ref 39–117)
ALT: 14 IU/L (ref 0–32)
AST: 22 IU/L (ref 0–40)
Albumin/Globulin Ratio: 2.2 (ref 1.2–2.2)
BUN / CREAT RATIO: 14 (ref 12–28)
BUN: 9 mg/dL (ref 8–27)
Bilirubin Total: 1 mg/dL (ref 0.0–1.2)
CO2: 25 mmol/L (ref 20–29)
CREATININE: 0.65 mg/dL (ref 0.57–1.00)
Calcium: 9.7 mg/dL (ref 8.7–10.3)
Chloride: 94 mmol/L — ABNORMAL LOW (ref 96–106)
GFR calc non Af Amer: 84 mL/min/{1.73_m2} (ref >59)
GFR, EST AFRICAN AMERICAN: 96 mL/min/{1.73_m2} (ref >59)
GLUCOSE: 91 mg/dL (ref 65–99)
Globulin, Total: 2 g/dL (ref 1.5–4.5)
Potassium: 4.6 mmol/L (ref 3.5–5.2)
Sodium: 135 mmol/L (ref 134–144)
TOTAL PROTEIN: 6.4 g/dL (ref 6.0–8.5)

## 2018-05-28 ENCOUNTER — Telehealth: Payer: Self-pay | Admitting: *Deleted

## 2018-05-28 NOTE — Telephone Encounter (Signed)
Spoke with patient and informed her that her labs are stable.  She verbalized understanding, appreciation.

## 2018-06-01 DIAGNOSIS — Z23 Encounter for immunization: Secondary | ICD-10-CM | POA: Diagnosis not present

## 2018-07-10 DIAGNOSIS — M159 Polyosteoarthritis, unspecified: Secondary | ICD-10-CM | POA: Diagnosis not present

## 2018-07-10 DIAGNOSIS — E785 Hyperlipidemia, unspecified: Secondary | ICD-10-CM | POA: Diagnosis not present

## 2018-07-10 DIAGNOSIS — I10 Essential (primary) hypertension: Secondary | ICD-10-CM | POA: Diagnosis not present

## 2018-07-10 DIAGNOSIS — M810 Age-related osteoporosis without current pathological fracture: Secondary | ICD-10-CM | POA: Diagnosis not present

## 2018-07-10 DIAGNOSIS — M150 Primary generalized (osteo)arthritis: Secondary | ICD-10-CM | POA: Diagnosis not present

## 2018-08-08 DIAGNOSIS — I10 Essential (primary) hypertension: Secondary | ICD-10-CM | POA: Diagnosis not present

## 2018-08-08 DIAGNOSIS — M159 Polyosteoarthritis, unspecified: Secondary | ICD-10-CM | POA: Diagnosis not present

## 2018-08-08 DIAGNOSIS — E785 Hyperlipidemia, unspecified: Secondary | ICD-10-CM | POA: Diagnosis not present

## 2018-08-08 DIAGNOSIS — M810 Age-related osteoporosis without current pathological fracture: Secondary | ICD-10-CM | POA: Diagnosis not present

## 2018-08-08 DIAGNOSIS — M150 Primary generalized (osteo)arthritis: Secondary | ICD-10-CM | POA: Diagnosis not present

## 2018-08-29 DIAGNOSIS — T7840XA Allergy, unspecified, initial encounter: Secondary | ICD-10-CM | POA: Diagnosis not present

## 2018-08-29 DIAGNOSIS — E559 Vitamin D deficiency, unspecified: Secondary | ICD-10-CM | POA: Diagnosis not present

## 2018-08-29 DIAGNOSIS — E785 Hyperlipidemia, unspecified: Secondary | ICD-10-CM | POA: Diagnosis not present

## 2018-08-29 DIAGNOSIS — I10 Essential (primary) hypertension: Secondary | ICD-10-CM | POA: Diagnosis not present

## 2018-08-29 DIAGNOSIS — F419 Anxiety disorder, unspecified: Secondary | ICD-10-CM | POA: Diagnosis not present

## 2018-08-29 DIAGNOSIS — M150 Primary generalized (osteo)arthritis: Secondary | ICD-10-CM | POA: Diagnosis not present

## 2018-08-29 DIAGNOSIS — B0229 Other postherpetic nervous system involvement: Secondary | ICD-10-CM | POA: Diagnosis not present

## 2018-08-29 DIAGNOSIS — M159 Polyosteoarthritis, unspecified: Secondary | ICD-10-CM | POA: Diagnosis not present

## 2018-09-27 DIAGNOSIS — I10 Essential (primary) hypertension: Secondary | ICD-10-CM | POA: Diagnosis not present

## 2018-10-10 ENCOUNTER — Other Ambulatory Visit (HOSPITAL_COMMUNITY): Payer: Self-pay | Admitting: Nurse Practitioner

## 2018-10-10 ENCOUNTER — Ambulatory Visit (HOSPITAL_COMMUNITY)
Admission: RE | Admit: 2018-10-10 | Discharge: 2018-10-10 | Disposition: A | Discharge status: Home / Self Care | Payer: PPO | Source: Ambulatory Visit | Attending: Nurse Practitioner | Admitting: Nurse Practitioner

## 2018-10-10 DIAGNOSIS — M7989 Other specified soft tissue disorders: Secondary | ICD-10-CM | POA: Diagnosis not present

## 2018-10-10 DIAGNOSIS — M810 Age-related osteoporosis without current pathological fracture: Secondary | ICD-10-CM | POA: Diagnosis not present

## 2018-10-10 DIAGNOSIS — R600 Localized edema: Secondary | ICD-10-CM | POA: Diagnosis not present

## 2018-10-10 DIAGNOSIS — M159 Polyosteoarthritis, unspecified: Secondary | ICD-10-CM | POA: Diagnosis not present

## 2018-10-10 DIAGNOSIS — M79605 Pain in left leg: Secondary | ICD-10-CM

## 2018-10-10 DIAGNOSIS — M150 Primary generalized (osteo)arthritis: Secondary | ICD-10-CM | POA: Diagnosis not present

## 2018-10-10 DIAGNOSIS — I10 Essential (primary) hypertension: Secondary | ICD-10-CM | POA: Diagnosis not present

## 2018-10-10 DIAGNOSIS — E785 Hyperlipidemia, unspecified: Secondary | ICD-10-CM | POA: Diagnosis not present

## 2018-10-10 NOTE — Progress Notes (Signed)
Left lower extremity venous duplex exam completed. Result called Dr. Clemetine Marker. More details please see preliminary notes on CV PROC under chart review.  Terri Liu H Derian Dimalanta(RDMS RVT) 10/10/18 4:25 PM '

## 2018-10-23 ENCOUNTER — Encounter: Payer: Self-pay | Admitting: Neurology

## 2018-10-25 DIAGNOSIS — M79605 Pain in left leg: Secondary | ICD-10-CM | POA: Diagnosis not present

## 2018-10-25 DIAGNOSIS — M79604 Pain in right leg: Secondary | ICD-10-CM | POA: Diagnosis not present

## 2018-10-25 DIAGNOSIS — R600 Localized edema: Secondary | ICD-10-CM | POA: Diagnosis not present

## 2018-10-29 DIAGNOSIS — M159 Polyosteoarthritis, unspecified: Secondary | ICD-10-CM | POA: Diagnosis not present

## 2018-10-29 DIAGNOSIS — M150 Primary generalized (osteo)arthritis: Secondary | ICD-10-CM | POA: Diagnosis not present

## 2018-10-29 DIAGNOSIS — I10 Essential (primary) hypertension: Secondary | ICD-10-CM | POA: Diagnosis not present

## 2018-10-29 DIAGNOSIS — E785 Hyperlipidemia, unspecified: Secondary | ICD-10-CM | POA: Diagnosis not present

## 2018-10-29 DIAGNOSIS — M810 Age-related osteoporosis without current pathological fracture: Secondary | ICD-10-CM | POA: Diagnosis not present

## 2018-11-15 DIAGNOSIS — I83893 Varicose veins of bilateral lower extremities with other complications: Secondary | ICD-10-CM | POA: Diagnosis not present

## 2018-11-15 DIAGNOSIS — M79604 Pain in right leg: Secondary | ICD-10-CM | POA: Diagnosis not present

## 2018-11-15 DIAGNOSIS — M79605 Pain in left leg: Secondary | ICD-10-CM | POA: Diagnosis not present

## 2018-11-15 DIAGNOSIS — I83813 Varicose veins of bilateral lower extremities with pain: Secondary | ICD-10-CM | POA: Diagnosis not present

## 2018-12-05 DIAGNOSIS — M150 Primary generalized (osteo)arthritis: Secondary | ICD-10-CM | POA: Diagnosis not present

## 2018-12-05 DIAGNOSIS — I10 Essential (primary) hypertension: Secondary | ICD-10-CM | POA: Diagnosis not present

## 2018-12-05 DIAGNOSIS — M159 Polyosteoarthritis, unspecified: Secondary | ICD-10-CM | POA: Diagnosis not present

## 2018-12-05 DIAGNOSIS — E785 Hyperlipidemia, unspecified: Secondary | ICD-10-CM | POA: Diagnosis not present

## 2018-12-05 DIAGNOSIS — M810 Age-related osteoporosis without current pathological fracture: Secondary | ICD-10-CM | POA: Diagnosis not present

## 2018-12-24 ENCOUNTER — Ambulatory Visit: Payer: PPO | Admitting: Neurology

## 2019-01-09 DIAGNOSIS — M150 Primary generalized (osteo)arthritis: Secondary | ICD-10-CM | POA: Diagnosis not present

## 2019-01-09 DIAGNOSIS — E7800 Pure hypercholesterolemia, unspecified: Secondary | ICD-10-CM | POA: Diagnosis not present

## 2019-01-09 DIAGNOSIS — E785 Hyperlipidemia, unspecified: Secondary | ICD-10-CM | POA: Diagnosis not present

## 2019-01-09 DIAGNOSIS — I10 Essential (primary) hypertension: Secondary | ICD-10-CM | POA: Diagnosis not present

## 2019-01-09 DIAGNOSIS — M159 Polyosteoarthritis, unspecified: Secondary | ICD-10-CM | POA: Diagnosis not present

## 2019-01-09 DIAGNOSIS — M810 Age-related osteoporosis without current pathological fracture: Secondary | ICD-10-CM | POA: Diagnosis not present

## 2019-02-01 DIAGNOSIS — E785 Hyperlipidemia, unspecified: Secondary | ICD-10-CM | POA: Diagnosis not present

## 2019-02-01 DIAGNOSIS — I10 Essential (primary) hypertension: Secondary | ICD-10-CM | POA: Diagnosis not present

## 2019-02-01 DIAGNOSIS — E7800 Pure hypercholesterolemia, unspecified: Secondary | ICD-10-CM | POA: Diagnosis not present

## 2019-02-01 DIAGNOSIS — M818 Other osteoporosis without current pathological fracture: Secondary | ICD-10-CM | POA: Diagnosis not present

## 2019-02-01 DIAGNOSIS — M159 Polyosteoarthritis, unspecified: Secondary | ICD-10-CM | POA: Diagnosis not present

## 2019-02-01 DIAGNOSIS — M810 Age-related osteoporosis without current pathological fracture: Secondary | ICD-10-CM | POA: Diagnosis not present

## 2019-02-01 DIAGNOSIS — M150 Primary generalized (osteo)arthritis: Secondary | ICD-10-CM | POA: Diagnosis not present

## 2019-03-05 DIAGNOSIS — M810 Age-related osteoporosis without current pathological fracture: Secondary | ICD-10-CM | POA: Diagnosis not present

## 2019-03-05 DIAGNOSIS — B0229 Other postherpetic nervous system involvement: Secondary | ICD-10-CM | POA: Diagnosis not present

## 2019-03-05 DIAGNOSIS — E7800 Pure hypercholesterolemia, unspecified: Secondary | ICD-10-CM | POA: Diagnosis not present

## 2019-03-05 DIAGNOSIS — I10 Essential (primary) hypertension: Secondary | ICD-10-CM | POA: Diagnosis not present

## 2019-03-05 DIAGNOSIS — M159 Polyosteoarthritis, unspecified: Secondary | ICD-10-CM | POA: Diagnosis not present

## 2019-04-01 DIAGNOSIS — M810 Age-related osteoporosis without current pathological fracture: Secondary | ICD-10-CM | POA: Diagnosis not present

## 2019-04-01 DIAGNOSIS — I10 Essential (primary) hypertension: Secondary | ICD-10-CM | POA: Diagnosis not present

## 2019-04-01 DIAGNOSIS — E785 Hyperlipidemia, unspecified: Secondary | ICD-10-CM | POA: Diagnosis not present

## 2019-04-01 DIAGNOSIS — M150 Primary generalized (osteo)arthritis: Secondary | ICD-10-CM | POA: Diagnosis not present

## 2019-05-08 DIAGNOSIS — E785 Hyperlipidemia, unspecified: Secondary | ICD-10-CM | POA: Diagnosis not present

## 2019-05-08 DIAGNOSIS — M159 Polyosteoarthritis, unspecified: Secondary | ICD-10-CM | POA: Diagnosis not present

## 2019-05-08 DIAGNOSIS — M810 Age-related osteoporosis without current pathological fracture: Secondary | ICD-10-CM | POA: Diagnosis not present

## 2019-05-08 DIAGNOSIS — I10 Essential (primary) hypertension: Secondary | ICD-10-CM | POA: Diagnosis not present

## 2019-05-08 DIAGNOSIS — M150 Primary generalized (osteo)arthritis: Secondary | ICD-10-CM | POA: Diagnosis not present

## 2019-05-30 DIAGNOSIS — I10 Essential (primary) hypertension: Secondary | ICD-10-CM | POA: Diagnosis not present

## 2019-05-30 DIAGNOSIS — E7800 Pure hypercholesterolemia, unspecified: Secondary | ICD-10-CM | POA: Diagnosis not present

## 2019-05-30 DIAGNOSIS — M150 Primary generalized (osteo)arthritis: Secondary | ICD-10-CM | POA: Diagnosis not present

## 2019-05-30 DIAGNOSIS — M810 Age-related osteoporosis without current pathological fracture: Secondary | ICD-10-CM | POA: Diagnosis not present

## 2019-05-30 DIAGNOSIS — E785 Hyperlipidemia, unspecified: Secondary | ICD-10-CM | POA: Diagnosis not present

## 2019-05-30 DIAGNOSIS — M159 Polyosteoarthritis, unspecified: Secondary | ICD-10-CM | POA: Diagnosis not present

## 2019-07-02 DIAGNOSIS — E785 Hyperlipidemia, unspecified: Secondary | ICD-10-CM | POA: Diagnosis not present

## 2019-07-02 DIAGNOSIS — M810 Age-related osteoporosis without current pathological fracture: Secondary | ICD-10-CM | POA: Diagnosis not present

## 2019-07-02 DIAGNOSIS — I10 Essential (primary) hypertension: Secondary | ICD-10-CM | POA: Diagnosis not present

## 2019-07-02 DIAGNOSIS — M159 Polyosteoarthritis, unspecified: Secondary | ICD-10-CM | POA: Diagnosis not present

## 2019-07-24 DIAGNOSIS — Z23 Encounter for immunization: Secondary | ICD-10-CM | POA: Diagnosis not present

## 2019-09-30 DIAGNOSIS — I10 Essential (primary) hypertension: Secondary | ICD-10-CM | POA: Diagnosis not present

## 2019-09-30 DIAGNOSIS — E559 Vitamin D deficiency, unspecified: Secondary | ICD-10-CM | POA: Diagnosis not present

## 2019-09-30 DIAGNOSIS — Z8781 Personal history of (healed) traumatic fracture: Secondary | ICD-10-CM | POA: Diagnosis not present

## 2019-09-30 DIAGNOSIS — B0229 Other postherpetic nervous system involvement: Secondary | ICD-10-CM | POA: Diagnosis not present

## 2019-09-30 DIAGNOSIS — E7800 Pure hypercholesterolemia, unspecified: Secondary | ICD-10-CM | POA: Diagnosis not present

## 2019-09-30 DIAGNOSIS — M159 Polyosteoarthritis, unspecified: Secondary | ICD-10-CM | POA: Diagnosis not present

## 2019-09-30 DIAGNOSIS — F419 Anxiety disorder, unspecified: Secondary | ICD-10-CM | POA: Diagnosis not present

## 2019-10-08 ENCOUNTER — Other Ambulatory Visit: Payer: Self-pay | Admitting: Internal Medicine

## 2019-10-08 DIAGNOSIS — M159 Polyosteoarthritis, unspecified: Secondary | ICD-10-CM

## 2019-10-27 ENCOUNTER — Ambulatory Visit: Payer: PPO | Attending: Internal Medicine

## 2019-10-27 DIAGNOSIS — Z23 Encounter for immunization: Secondary | ICD-10-CM | POA: Insufficient documentation

## 2019-10-27 NOTE — Progress Notes (Signed)
   Covid-19 Vaccination Clinic  Name:  Terri Liu    MRN: [id number] DOB: 30-Nov-1933  10/27/2019  Terri Liu was observed post Covid-19 immunization for 15 minutes without incidence. She was provided with Vaccine Information Sheet and instruction to access the V-Safe system.   Terri Liu was instructed to call 911 with any severe reactions post vaccine: Marland Kitchen Difficulty breathing  . Swelling of your face and throat  . A fast heartbeat  . A bad rash all over your body  . Dizziness and weakness    Immunizations Administered    Name Date Dose VIS Date Route   Pfizer COVID-19 Vaccine 10/27/2019  1:16 PM 0.3 mL 08/23/2019 Intramuscular   Manufacturer: [place]   Lot: [id number]   [place]: [id number]

## 2019-10-30 DIAGNOSIS — M810 Age-related osteoporosis without current pathological fracture: Secondary | ICD-10-CM | POA: Diagnosis not present

## 2019-10-30 DIAGNOSIS — E7800 Pure hypercholesterolemia, unspecified: Secondary | ICD-10-CM | POA: Diagnosis not present

## 2019-10-30 DIAGNOSIS — M159 Polyosteoarthritis, unspecified: Secondary | ICD-10-CM | POA: Diagnosis not present

## 2019-10-30 DIAGNOSIS — I10 Essential (primary) hypertension: Secondary | ICD-10-CM | POA: Diagnosis not present

## 2019-11-19 ENCOUNTER — Ambulatory Visit: Payer: PPO | Attending: Internal Medicine

## 2019-11-19 DIAGNOSIS — Z23 Encounter for immunization: Secondary | ICD-10-CM

## 2019-11-19 NOTE — Progress Notes (Signed)
   Covid-19 Vaccination Clinic  Name:  LAKENDA HEIT    MRN: [id number] DOB: [birth year]  11/19/2019  Ms. Haven was observed post Covid-19 immunization for 15 minutes without incident. She was provided with Vaccine Information Sheet and instruction to access the V-Safe system.   Ms. Laurent was instructed to call 911 with any severe reactions post vaccine: Marland Kitchen Difficulty breathing  . Swelling of face and throat  . A fast heartbeat  . A bad rash all over body  . Dizziness and weakness   Immunizations Administered    Name Date Dose VIS Date Route   Pfizer COVID-19 Vaccine 11/19/2019  5:17 PM 0.3 mL 08/23/2019 Intramuscular   Manufacturer: [place]   Lot: [id number]   [place]: [id number]

## 2019-12-02 DIAGNOSIS — M19012 Primary osteoarthritis, left shoulder: Secondary | ICD-10-CM | POA: Diagnosis not present

## 2019-12-02 DIAGNOSIS — M25512 Pain in left shoulder: Secondary | ICD-10-CM | POA: Diagnosis not present

## 2019-12-02 DIAGNOSIS — M25511 Pain in right shoulder: Secondary | ICD-10-CM | POA: Diagnosis not present

## 2019-12-02 DIAGNOSIS — M25562 Pain in left knee: Secondary | ICD-10-CM | POA: Diagnosis not present

## 2019-12-03 ENCOUNTER — Other Ambulatory Visit (HOSPITAL_COMMUNITY): Payer: Self-pay | Admitting: Ophthalmology

## 2019-12-03 DIAGNOSIS — H34219 Partial retinal artery occlusion, unspecified eye: Secondary | ICD-10-CM

## 2019-12-03 DIAGNOSIS — H34213 Partial retinal artery occlusion, bilateral: Secondary | ICD-10-CM | POA: Diagnosis not present

## 2019-12-09 ENCOUNTER — Encounter (HOSPITAL_COMMUNITY): Payer: PPO

## 2019-12-12 ENCOUNTER — Other Ambulatory Visit (HOSPITAL_COMMUNITY): Payer: Self-pay | Admitting: Ophthalmology

## 2019-12-12 DIAGNOSIS — H34219 Partial retinal artery occlusion, unspecified eye: Secondary | ICD-10-CM

## 2019-12-13 ENCOUNTER — Ambulatory Visit (HOSPITAL_COMMUNITY)
Admission: RE | Admit: 2019-12-13 | Discharge: 2019-12-13 | Disposition: A | Discharge status: Home / Self Care | Payer: PPO | Source: Ambulatory Visit | Attending: Cardiology | Admitting: Cardiology

## 2019-12-13 ENCOUNTER — Other Ambulatory Visit: Payer: Self-pay

## 2019-12-13 DIAGNOSIS — H34219 Partial retinal artery occlusion, unspecified eye: Secondary | ICD-10-CM | POA: Diagnosis not present

## 2019-12-13 DIAGNOSIS — H34211 Partial retinal artery occlusion, right eye: Secondary | ICD-10-CM

## 2019-12-18 ENCOUNTER — Ambulatory Visit
Admission: RE | Admit: 2019-12-18 | Discharge: 2019-12-18 | Disposition: A | Discharge status: Home / Self Care | Payer: PPO | Source: Ambulatory Visit | Attending: Internal Medicine | Admitting: Internal Medicine

## 2019-12-18 ENCOUNTER — Other Ambulatory Visit: Payer: Self-pay

## 2019-12-18 DIAGNOSIS — M85832 Other specified disorders of bone density and structure, left forearm: Secondary | ICD-10-CM | POA: Diagnosis not present

## 2019-12-18 DIAGNOSIS — Z780 Asymptomatic menopausal state: Secondary | ICD-10-CM | POA: Diagnosis not present

## 2019-12-18 DIAGNOSIS — M159 Polyosteoarthritis, unspecified: Secondary | ICD-10-CM

## 2019-12-20 ENCOUNTER — Other Ambulatory Visit: Payer: Self-pay

## 2019-12-20 ENCOUNTER — Ambulatory Visit (HOSPITAL_COMMUNITY): Payer: PPO | Attending: Cardiology

## 2019-12-20 DIAGNOSIS — H34211 Partial retinal artery occlusion, right eye: Secondary | ICD-10-CM

## 2019-12-20 DIAGNOSIS — E785 Hyperlipidemia, unspecified: Secondary | ICD-10-CM | POA: Insufficient documentation

## 2019-12-20 DIAGNOSIS — I080 Rheumatic disorders of both mitral and aortic valves: Secondary | ICD-10-CM | POA: Insufficient documentation

## 2019-12-20 DIAGNOSIS — H34219 Partial retinal artery occlusion, unspecified eye: Secondary | ICD-10-CM | POA: Insufficient documentation

## 2019-12-20 DIAGNOSIS — I447 Left bundle-branch block, unspecified: Secondary | ICD-10-CM | POA: Insufficient documentation

## 2019-12-20 DIAGNOSIS — I119 Hypertensive heart disease without heart failure: Secondary | ICD-10-CM | POA: Diagnosis not present

## 2019-12-27 DIAGNOSIS — M810 Age-related osteoporosis without current pathological fracture: Secondary | ICD-10-CM | POA: Diagnosis not present

## 2019-12-27 DIAGNOSIS — M159 Polyosteoarthritis, unspecified: Secondary | ICD-10-CM | POA: Diagnosis not present

## 2019-12-27 DIAGNOSIS — I10 Essential (primary) hypertension: Secondary | ICD-10-CM | POA: Diagnosis not present

## 2019-12-27 DIAGNOSIS — E7800 Pure hypercholesterolemia, unspecified: Secondary | ICD-10-CM | POA: Diagnosis not present

## 2020-01-03 DIAGNOSIS — H34213 Partial retinal artery occlusion, bilateral: Secondary | ICD-10-CM | POA: Diagnosis not present

## 2020-03-13 DIAGNOSIS — E7800 Pure hypercholesterolemia, unspecified: Secondary | ICD-10-CM | POA: Diagnosis not present

## 2020-03-13 DIAGNOSIS — I10 Essential (primary) hypertension: Secondary | ICD-10-CM | POA: Diagnosis not present

## 2020-03-13 DIAGNOSIS — M810 Age-related osteoporosis without current pathological fracture: Secondary | ICD-10-CM | POA: Diagnosis not present

## 2020-03-13 DIAGNOSIS — M159 Polyosteoarthritis, unspecified: Secondary | ICD-10-CM | POA: Diagnosis not present

## 2020-03-31 DIAGNOSIS — M159 Polyosteoarthritis, unspecified: Secondary | ICD-10-CM | POA: Diagnosis not present

## 2020-03-31 DIAGNOSIS — E7800 Pure hypercholesterolemia, unspecified: Secondary | ICD-10-CM | POA: Diagnosis not present

## 2020-03-31 DIAGNOSIS — M810 Age-related osteoporosis without current pathological fracture: Secondary | ICD-10-CM | POA: Diagnosis not present

## 2020-03-31 DIAGNOSIS — B0229 Other postherpetic nervous system involvement: Secondary | ICD-10-CM | POA: Diagnosis not present

## 2020-03-31 DIAGNOSIS — I10 Essential (primary) hypertension: Secondary | ICD-10-CM | POA: Diagnosis not present

## 2020-03-31 DIAGNOSIS — R0600 Dyspnea, unspecified: Secondary | ICD-10-CM | POA: Diagnosis not present

## 2020-04-13 DIAGNOSIS — I10 Essential (primary) hypertension: Secondary | ICD-10-CM | POA: Diagnosis not present

## 2020-04-13 DIAGNOSIS — E7800 Pure hypercholesterolemia, unspecified: Secondary | ICD-10-CM | POA: Diagnosis not present

## 2020-04-13 DIAGNOSIS — M810 Age-related osteoporosis without current pathological fracture: Secondary | ICD-10-CM | POA: Diagnosis not present

## 2020-04-13 DIAGNOSIS — M159 Polyosteoarthritis, unspecified: Secondary | ICD-10-CM | POA: Diagnosis not present

## 2020-04-14 ENCOUNTER — Encounter: Payer: Self-pay | Admitting: Internal Medicine

## 2020-04-14 ENCOUNTER — Other Ambulatory Visit: Payer: Self-pay

## 2020-04-14 ENCOUNTER — Ambulatory Visit: Payer: PPO | Admitting: Internal Medicine

## 2020-04-14 VITALS — BP 132/80 | HR 61 | Ht 64.0 in | Wt 176.4 lb

## 2020-04-14 DIAGNOSIS — I5021 Acute systolic (congestive) heart failure: Secondary | ICD-10-CM

## 2020-04-14 DIAGNOSIS — I10 Essential (primary) hypertension: Secondary | ICD-10-CM | POA: Diagnosis not present

## 2020-04-14 DIAGNOSIS — R0600 Dyspnea, unspecified: Secondary | ICD-10-CM

## 2020-04-14 DIAGNOSIS — E785 Hyperlipidemia, unspecified: Secondary | ICD-10-CM | POA: Diagnosis not present

## 2020-04-14 DIAGNOSIS — I447 Left bundle-branch block, unspecified: Secondary | ICD-10-CM | POA: Diagnosis not present

## 2020-04-14 DIAGNOSIS — R0609 Other forms of dyspnea: Secondary | ICD-10-CM

## 2020-04-14 NOTE — Patient Instructions (Signed)
Medication Instructions:  No Changes *If you need a refill on your cardiac medications before your next appointment, please call your pharmacy*   Lab Work: BMET - Lab work ordered today If you have labs (blood work) drawn today and your tests are completely normal, you will receive your results only by: Marland Kitchen MyChart Message (if you have MyChart) OR . A paper copy in the mail If you have any lab test that is abnormal or we need to change your treatment, we will call you to review the results.   Testing/Procedures: This will be done at [address] has requested that you have a Lexicographer. Please follow instruction sheet, as given.   Follow-Up: At [facility], you and your health needs are our priority.  As part of our continuing mission to provide you with exceptional heart care, we have created designated Provider Care Teams.  These Care Teams include your primary Cardiologist (physician) and Advanced Practice Providers (APPs -  Physician Assistants and Nurse Practitioners) who all work together to provide you with the care you need, when you need it.  We recommend signing up for the patient portal called "MyChart".  Sign up information is provided on this After Visit Summary.  MyChart is used to connect with patients for Virtual Visits (Telemedicine).  Patients are able to view lab/test results, encounter notes, upcoming appointments, etc.  Non-urgent messages can be sent to your provider as well.   To learn more about what you can do with MyChart, go to [email or web address].    Your next appointment:   1 month(s)  The format for your next appointment:   In Person  Provider:   Cherlynn Kaiser, MD   Other Instructions

## 2020-04-14 NOTE — Progress Notes (Addendum)
Cardiology Office Note:    Date:  04/14/2020   ID:  Terri Liu, DOB July 29, 1934, MRN [id number]  PCP:  Leeroy Cha, MD  Cardiologist:  No primary care provider on file.  Electrophysiologist:  None   Referring MD: Leeroy Cha,*   Chief Complaint: Dyspnea  History of Present Illness:    Terri Liu is a 86 y.o. female with a history of carpal tunnel of the right wrist, hypertension, hyperlipidemia, anxiety who presents for evaluation of dyspnea.  She initially presented to the ophthalmologist who found signs of atherosclerosis in the eye, recommended echocardiogram. In April 2021 she had an echocardiogram documenting an EF of 40 to 45% with moderate LVH and left bundle branch block.  Grade 1 diastolic dysfunction.  Normal right ventricular function.  Mild mixed aortic valve disease.  Prior echocardiogram from 2018 showed normal EF and no significant aortic valve disease.   She has a seemingly new LBBB, last ecg available is 2018.  Patient notes that on 03/24/20 she started swelling.  She has had DOE for 1 year walking to [employer].  She gets very fatigued.  Endorses chest discomfort but no frank chest pain.  Primarily her symptoms involve exertional dyspnea.  Symptoms improve when she rests for a few minutes.  No radiation of chest discomfort to her arm or neck or jaw.  The patient denies dyspnea at rest, palpitations, PND, orthopnea. Denies cough, fever, chills. Denies nausea, vomiting. Denies syncope or presyncope. Denies dizziness or lightheadedness. Denies snoring.   Past Medical History:  Diagnosis Date  . Arthritis   . Diarrhea    "sometimes ., due to Gallbladder removed"  . High cholesterol   . History of shingles    effecrt nerve sees neurologist in [facility]  . Hypertension   . Post herpetic neuralgia 08/15/2016   Right V1  . Shingles     Past Surgical History:  Procedure Laterality Date  . ABDOMINAL HYSTERECTOMY     COMPLETE  . CARPAL TUNNEL  RELEASE Bilateral   . CHOLECYSTECTOMY    . EYE SURGERY     Cornea  . FEMUR IM NAIL Left 01/12/2017   Procedure: INTRAMEDULLARY (IM) NAIL FEMORAL LEFT;  Surgeon: Rod Can, MD;  [facility]: [facility];  Service: Orthopedics;  Laterality: Left;  Marland Kitchen GAS INSERTION Right 01/27/2015   Procedure: INSERTION OF GAS;  Surgeon: Hayden Pedro, MD;  Location: [place];  Service: Ophthalmology;  Laterality: Right;  C3F8  . JOINT REPLACEMENT Right 4 YRS AGO   KNEE  . KNEE ARTHROSCOPY Bilateral   . MEMBRANE PEEL Right 01/27/2015   Procedure: MEMBRANE PEEL;  Surgeon: Hayden Pedro, MD;  Location: [place];  Service: Ophthalmology;  Laterality: Right;  . PERFLUORONE INJECTION Right 01/27/2015   Procedure: PERFLUORONE INJECTION;  Surgeon: Hayden Pedro, MD;  Location: [place];  Service: Ophthalmology;  Laterality: Right;  . PHOTOCOAGULATION WITH LASER Right 01/27/2015   Procedure: PHOTOCOAGULATION WITH LASER;  Surgeon: Hayden Pedro, MD;  Location: [place];  Service: Ophthalmology;  Laterality: Right;  ENDOLASER  . 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 TOTAL KNEE ARTHROPLASTY Left 12/17/2015   Procedure: LEFT TOTAL KNEE ARTHROPLASTY;  Surgeon: Susa Day, MD;  [facility];  Service: Orthopedics;  Laterality: Left;  Marland Kitchen VITRECTOMY 25 GAUGE WITH SCLERAL BUCKLE Right 01/27/2015   Procedure:  VITRECTOMY 25 GAUGE WITH SCLERAL BUCKLE;  Surgeon: Hayden Pedro, MD;  Location: [place];  Service: Ophthalmology;  Laterality: Right;    Current Medications: Current Meds  Medication Sig  . aspirin EC 81 MG EC tablet Take 1 tablet (81 mg total) by mouth 2 (two) times daily with a meal.  . BIOTIN FORTE PO Take by mouth.  . cholecalciferol (VITAMIN D) 1000 units tablet Take 1,000 Units by mouth daily.  Marland Kitchen lovastatin (MEVACOR) 40 MG tablet take 1 tablet  once a day orally 90 days  . metoprolol tartrate (LOPRESSOR) 25 MG tablet Take 25 mg by mouth 2 (two) times daily.  . Misc Natural Products (TART CHERRY ADVANCED PO) Take by mouth.  . OXcarbazepine (TRILEPTAL) 150 MG tablet Take 1 tablet (150 mg total) by mouth 2 (two) times daily.  . Prenatal Vit-Fe Fumarate-FA (PRENATAL MULTIVITAMIN) TABS Take 1 tablet by mouth every morning.   . [DISCONTINUED] acetaminophen (TYLENOL) 325 MG tablet Take 650 mg by mouth every 6 (six) hours as needed for mild pain.  . [DISCONTINUED] Ascorbic Acid (VITAMIN C ER PO) Take by mouth.  . [DISCONTINUED] cyclobenzaprine (FLEXERIL) 10 MG tablet Take 1 tablet (10 mg total) by mouth 2 (two) times daily as needed for muscle spasms.  . [DISCONTINUED] escitalopram (LEXAPRO) 5 MG tablet Take 5 mg by mouth daily.  . [DISCONTINUED] HYDROcodone-acetaminophen (NORCO/VICODIN) 5-325 MG tablet Take 1 tablet by mouth every 6 (six) hours as needed for moderate pain.  . [DISCONTINUED] valsartan (DIOVAN) 160 MG tablet Take 160 mg by mouth daily.      Allergies:   Cymbalta [duloxetine hcl], Lipitor [atorvastatin], Tape, and Codeine   Social History   Socioeconomic History  . Marital status: Married    Spouse name: Not on file  . Number of children: 3  . Years of education: 10  . Highest education level: Not on file  Occupational History  . Occupation: N/A  Tobacco Use  . Smoking status: Never Smoker  . Smokeless tobacco: Never Used  Substance and Sexual Activity  . Alcohol use: No  . Drug use: No  . Sexual activity: Not on file  Other Topics Concern  . Not on file  Social History Narrative   Lives at home w/ her husband   Right-handed   Caffeine: 2 cups of half caf coffee each morning   Social Determinants of Health   Financial Resource Strain:   . Difficulty of Paying Living Expenses:   Food Insecurity:   . Worried About Charity fundraiser in the Last Year:   . Arboriculturist in the Last Year:   Transportation  Needs:   . Film/video editor (Medical):   Marland Kitchen Lack of Transportation (Non-Medical):   Physical Activity:   . Days of Exercise per Week:   . Minutes of Exercise per Session:   Stress:   . Feeling of Stress :   Social Connections:   . Frequency of Communication with Friends and Family:   . Frequency of Social Gatherings with Friends and Family:   . Attends Religious Services:   . Active Member of Clubs or Organizations:   . Attends Archivist Meetings:   Marland Kitchen Marital Status:      Family History: The patient's family history includes Cancer in her brother and sister; Cancer - Prostate in her father; Heart disease in her brother; Heart failure in her mother.  ROS:   Please see the history of present illness.  All other systems reviewed and are negative.  EKGs/Labs/Other Studies Reviewed:    The following studies were reviewed today:  EKG:  NSR, LBBB, left axis deviation.  QRS duration 138 ms.   Recent Labs: 04/14/2020: BUN 8; Creatinine, Ser 0.62; Potassium 4.4; Sodium 139 normal labs, reviewed. Recent Lipid Panel No results found for: CHOL, TRIG, HDL, CHOLHDL, VLDL, LDLCALC, LDLDIRECT  Physical Exam:    VS:  BP 132/80 (BP Location: Left Arm, Patient Position: Sitting, Cuff Size: Normal)   Pulse 61   Ht 5\' 4"  (1.626 m)   Wt 176 lb 6.4 oz (80 kg)   BMI 30.28 kg/m     Wt Readings from Last 5 Encounters:  04/14/20 176 lb 6.4 oz (80 kg)  05/23/18 169 lb 12.8 oz (77 kg)  11/20/17 172 lb (78 kg)  06/08/17 169 lb 8 oz (76.9 kg)  02/13/17 171 lb (77.6 kg)     Constitutional: No acute distress Eyes: sclera non-icteric, normal conjunctiva and lids ENMT: normal dentition, moist mucous membranes Cardiovascular: regular rhythm, normal rate, no murmurs. S1 and S2 normal. Radial pulses normal bilaterally. No jugular venous distention.  Respiratory: clear to auscultation bilaterally GI : normal bowel sounds, soft and nontender. No distention.   MSK: extremities warm,  well perfused. No edema.  NEURO: grossly nonfocal exam, moves all extremities. PSYCH: alert and oriented x 3, normal mood and affect.   ASSESSMENT:    1. Left bundle branch block   2. Dyspnea on exertion   3. Acute systolic heart failure ([place])   4. Essential hypertension   5. Hyperlipidemia, unspecified hyperlipidemia type    PLAN:    Left bundle branch block - Plan: EKG 12-Lead, MYOCARDIAL PERFUSION IMAGING Dyspnea on exertion - Plan: EKG 12-Lead, MYOCARDIAL PERFUSION IMAGING, Basic metabolic panel -To further evaluate her reduced ejection fraction we will need to perform an ischemic evaluation.  We have discussed all options for noninvasive ischemic testing.  Participated in shared decision making and determined pharmacologic stress Myoview is the appropriate next step.  I am quite concerned about her symptoms, we will try to get this accomplished quite quickly.  Acute systolic heart failure ([place]) - Plan: EKG 12-Lead, MYOCARDIAL PERFUSION IMAGING, Basic metabolic panel -She is having leg swelling and shortness of breath and will benefit from diuresis.  I had like to check her creatinine first, will perform electrolyte panel. Addendum: Patient's creatinine was appropriate to start furosemide 20 mg daily.  Essential hypertension-reasonable blood pressure control, less than 140/90.  On metoprolol tartrate.  Can continue for now, but we will need to transition this to metoprolol succinate for the benefit of her reduced EF.  Will address at close follow-up.  Hyperlipidemia, unspecified hyperlipidemia type -currently on lovastatin 40 mg daily.  LDL 68, triglycerides 146, HDL 64.  Labs from January 2021.  Okay to continue lovastatin for now, will adjust as needed if ischemic evaluation is indicative of CAD.  Total time of encounter: 45 minutes total time of encounter, including 30 minutes spent in face-to-face patient care on the date of this encounter. This time includes coordination of care  and counseling regarding above mentioned problem list. Remainder of non-face-to-face time involved reviewing chart documents/testing relevant to the patient encounter and documentation in the medical record. I have independently reviewed documentation from referring provider.  29 pages of outside medical records reviewed in conjunction with this consultation.  Cherlynn Kaiser, MD [place]  [facility]    Medication Adjustments/Labs and Tests Ordered: Current medicines are  reviewed at length with the patient today.  Concerns regarding medicines are outlined above.  Orders Placed This Encounter  Procedures  . Basic metabolic panel  . MYOCARDIAL PERFUSION IMAGING  . EKG 12-Lead   No orders of the defined types were placed in this encounter.   Patient Instructions  Medication Instructions:  No Changes *If you need a refill on your cardiac medications before your next appointment, please call your pharmacy*   Lab Work: BMET - Lab work ordered today If you have labs (blood work) drawn today and your tests are completely normal, you will receive your results only by: Marland Kitchen MyChart Message (if you have MyChart) OR . A paper copy in the mail If you have any lab test that is abnormal or we need to change your treatment, we will call you to review the results.   Testing/Procedures: This will be done at [address] has requested that you have a Lexicographer. Please follow instruction sheet, as given.   Follow-Up: At [facility], you and your health needs are our priority.  As part of our continuing mission to provide you with exceptional heart care, we have created designated Provider Care Teams.  These Care Teams include your primary Cardiologist (physician) and Advanced Practice Providers (APPs -  Physician Assistants and Nurse Practitioners) who all work together to provide you with the care you need, when you need it.  We recommend  signing up for the patient portal called "MyChart".  Sign up information is provided on this After Visit Summary.  MyChart is used to connect with patients for Virtual Visits (Telemedicine).  Patients are able to view lab/test results, encounter notes, upcoming appointments, etc.  Non-urgent messages can be sent to your provider as well.   To learn more about what you can do with MyChart, go to [email or web address].    Your next appointment:   1 month(s)  The format for your next appointment:   In Person  Provider:   Cherlynn Kaiser, MD   Other Instructions

## 2020-04-15 ENCOUNTER — Telehealth (HOSPITAL_COMMUNITY): Payer: Self-pay | Admitting: *Deleted

## 2020-04-15 LAB — BASIC METABOLIC PANEL
BUN/Creatinine Ratio: 13 (ref 12–28)
BUN: 8 mg/dL (ref 8–27)
CO2: 29 mmol/L (ref 20–29)
Calcium: 9.4 mg/dL (ref 8.7–10.3)
Chloride: 100 mmol/L (ref 96–106)
Creatinine, Ser: 0.62 mg/dL (ref 0.57–1.00)
GFR calc Af Amer: 96 mL/min/{1.73_m2} (ref >59)
GFR calc non Af Amer: 84 mL/min/{1.73_m2} (ref >59)
Glucose: 114 mg/dL — ABNORMAL HIGH (ref 65–99)
Potassium: 4.4 mmol/L (ref 3.5–5.2)
Sodium: 139 mmol/L (ref 134–144)

## 2020-04-15 NOTE — Telephone Encounter (Signed)
Patient given detailed instructions per Myocardial Perfusion Study Information Sheet for the test on 04/17/20 at 10:30. Patient notified to arrive 15 minutes early and that it is imperative to arrive on time for appointment to keep from having the test rescheduled.  If you need to cancel or reschedule your appointment, please call the office within 24 hours of your appointment. . Patient verbalized understanding.Terri Liu

## 2020-04-16 ENCOUNTER — Telehealth: Payer: Self-pay | Admitting: *Deleted

## 2020-04-16 MED ORDER — FUROSEMIDE 20 MG PO TABS: 20.0000 mg | ORAL_TABLET | Freq: Every day | ORAL | 3 refills | Status: DC

## 2020-04-16 NOTE — Telephone Encounter (Signed)
-----   Message from Elouise Munroe, MD sent at 04/15/2020  9:05 AM EDT ----- Creatinine is stable, please start furosemide 20 mg daily.

## 2020-04-16 NOTE — Telephone Encounter (Signed)
Spoke to patient.  Patient aware of labs .  Prescription furosemide 20 mg daily  sent to [facility] per patient request.

## 2020-04-17 ENCOUNTER — Other Ambulatory Visit: Payer: Self-pay

## 2020-04-17 ENCOUNTER — Ambulatory Visit (HOSPITAL_COMMUNITY): Payer: PPO | Attending: Cardiovascular Disease

## 2020-04-17 VITALS — Ht 64.0 in | Wt 176.0 lb

## 2020-04-17 DIAGNOSIS — R300 Dysuria: Secondary | ICD-10-CM | POA: Diagnosis not present

## 2020-04-17 DIAGNOSIS — I447 Left bundle-branch block, unspecified: Secondary | ICD-10-CM | POA: Diagnosis not present

## 2020-04-17 DIAGNOSIS — I5021 Acute systolic (congestive) heart failure: Secondary | ICD-10-CM | POA: Insufficient documentation

## 2020-04-17 DIAGNOSIS — R0600 Dyspnea, unspecified: Secondary | ICD-10-CM | POA: Diagnosis not present

## 2020-04-17 DIAGNOSIS — R110 Nausea: Secondary | ICD-10-CM | POA: Diagnosis not present

## 2020-04-17 DIAGNOSIS — R0609 Other forms of dyspnea: Secondary | ICD-10-CM

## 2020-04-17 LAB — MYOCARDIAL PERFUSION IMAGING
LV dias vol: 122 mL (ref 46–106)
LV sys vol: 64 mL
Peak HR: 78 {beats}/min
Rest HR: 61 {beats}/min
SDS: 4
SRS: 7
SSS: 12
TID: 1.09

## 2020-04-17 MED ORDER — TECHNETIUM TC 99M TETROFOSMIN IV KIT
31.3000 | PACK | Freq: Once | INTRAVENOUS | Status: AC | PRN
  Administered 2020-04-17: 31.3 via INTRAVENOUS
  Filled 2020-04-17: qty 32

## 2020-04-17 MED ORDER — REGADENOSON 0.4 MG/5ML IV SOLN
0.4000 mg | Freq: Once | INTRAVENOUS | Status: AC
  Administered 2020-04-17: 0.4 mg via INTRAVENOUS

## 2020-04-17 MED ORDER — TECHNETIUM TC 99M TETROFOSMIN IV KIT
10.9000 | PACK | Freq: Once | INTRAVENOUS | Status: AC | PRN
  Administered 2020-04-17: 10.9 via INTRAVENOUS
  Filled 2020-04-17: qty 11

## 2020-04-17 MED ORDER — AMINOPHYLLINE 25 MG/ML IV SOLN
75.0000 mg | Freq: Once | INTRAVENOUS | Status: AC
  Administered 2020-04-17: 75 mg via INTRAVENOUS

## 2020-05-13 DIAGNOSIS — E7800 Pure hypercholesterolemia, unspecified: Secondary | ICD-10-CM | POA: Diagnosis not present

## 2020-05-13 DIAGNOSIS — I10 Essential (primary) hypertension: Secondary | ICD-10-CM | POA: Diagnosis not present

## 2020-05-13 DIAGNOSIS — M159 Polyosteoarthritis, unspecified: Secondary | ICD-10-CM | POA: Diagnosis not present

## 2020-05-13 DIAGNOSIS — M810 Age-related osteoporosis without current pathological fracture: Secondary | ICD-10-CM | POA: Diagnosis not present

## 2020-06-01 ENCOUNTER — Encounter: Payer: Self-pay | Admitting: Internal Medicine

## 2020-06-01 ENCOUNTER — Other Ambulatory Visit: Payer: Self-pay

## 2020-06-01 ENCOUNTER — Ambulatory Visit: Payer: PPO | Admitting: Internal Medicine

## 2020-06-01 VITALS — BP 132/80 | HR 54 | Temp 97.2°F | Ht 64.0 in | Wt 171.8 lb

## 2020-06-01 DIAGNOSIS — H34219 Partial retinal artery occlusion, unspecified eye: Secondary | ICD-10-CM | POA: Diagnosis not present

## 2020-06-01 DIAGNOSIS — I5020 Unspecified systolic (congestive) heart failure: Secondary | ICD-10-CM

## 2020-06-01 DIAGNOSIS — R0600 Dyspnea, unspecified: Secondary | ICD-10-CM | POA: Diagnosis not present

## 2020-06-01 DIAGNOSIS — I10 Essential (primary) hypertension: Secondary | ICD-10-CM | POA: Diagnosis not present

## 2020-06-01 DIAGNOSIS — E785 Hyperlipidemia, unspecified: Secondary | ICD-10-CM | POA: Diagnosis not present

## 2020-06-01 DIAGNOSIS — I447 Left bundle-branch block, unspecified: Secondary | ICD-10-CM | POA: Diagnosis not present

## 2020-06-01 DIAGNOSIS — R0609 Other forms of dyspnea: Secondary | ICD-10-CM

## 2020-06-01 NOTE — Progress Notes (Deleted)
Cardiology Office Note:    Date:  06/01/2020   ID:  Terri Liu, DOB 01-01-1934, MRN [id number]  PCP:  Leeroy Cha, MD  Cardiologist:  No primary care provider on file.  Electrophysiologist:  None   Referring MD: Leeroy Cha,*   Chief Complaint/Reason for Referral: ***  History of Present Illness:    Terri Liu is a 86 y.o. female with a history of carpal tunnel of the right wrist, hypertension, hyperlipidemia, anxiety who presents for evaluation and follow up of dyspnea.   Her daughter-in-law and great grandson (90 mo old) join her for the visit today.  She is feeling slightly, better but still mourning the loss of her son.   We reviewed stress test which was low risk. Discussed that low EF may be secondary to HTN, and we will need to have her follow BP closely and consider starting HF therapy.  Past Medical History:  Diagnosis Date  . Arthritis   . Diarrhea    "sometimes ., due to Gallbladder removed"  . High cholesterol   . History of shingles    effecrt nerve sees neurologist in [facility]  . Hypertension   . Post herpetic neuralgia 08/15/2016   Right V1  . Shingles     Past Surgical History:  Procedure Laterality Date  . ABDOMINAL HYSTERECTOMY     COMPLETE  . CARPAL TUNNEL RELEASE Bilateral   . CHOLECYSTECTOMY    . EYE SURGERY     Cornea  . FEMUR IM NAIL Left 01/12/2017   Procedure: INTRAMEDULLARY (IM) NAIL FEMORAL LEFT;  Surgeon: Rod Can, MD;  [facility]: [facility];  Service: Orthopedics;  Laterality: Left;  Marland Kitchen GAS INSERTION Right 01/27/2015   Procedure: INSERTION OF GAS;  Surgeon: Hayden Pedro, MD;  Location: [place];  Service: Ophthalmology;  Laterality: Right;  C3F8  . JOINT REPLACEMENT Right 4 YRS AGO   KNEE  . KNEE ARTHROSCOPY Bilateral   . MEMBRANE PEEL Right 01/27/2015   Procedure: MEMBRANE PEEL;  Surgeon: Hayden Pedro, MD;  Location: [place];  Service: Ophthalmology;  Laterality: Right;  . PERFLUORONE INJECTION Right  01/27/2015   Procedure: PERFLUORONE INJECTION;  Surgeon: Hayden Pedro, MD;  Location: [place];  Service: Ophthalmology;  Laterality: Right;  . PHOTOCOAGULATION WITH LASER Right 01/27/2015   Procedure: PHOTOCOAGULATION WITH LASER;  Surgeon: Hayden Pedro, MD;  Location: [place];  Service: Ophthalmology;  Laterality: Right;  ENDOLASER  . 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 TOTAL KNEE ARTHROPLASTY Left 12/17/2015   Procedure: LEFT TOTAL KNEE ARTHROPLASTY;  Surgeon: Susa Day, MD;  [facility];  Service: Orthopedics;  Laterality: Left;  Marland Kitchen VITRECTOMY 25 GAUGE WITH SCLERAL BUCKLE Right 01/27/2015   Procedure: VITRECTOMY 25 GAUGE WITH SCLERAL BUCKLE;  Surgeon: Hayden Pedro, MD;  Location: [place];  Service: Ophthalmology;  Laterality: Right;    Current Medications: Current Meds  Medication Sig  . aspirin EC 81 MG EC tablet Take 1 tablet (81 mg total) by mouth 2 (two) times daily with a meal.  . BIOTIN FORTE PO Take by mouth.  . cholecalciferol (VITAMIN D) 1000 units tablet Take 1,000 Units by mouth daily.  . furosemide (LASIX) 20 MG tablet Take 1 tablet (20 mg total) by mouth daily.  Marland Kitchen lovastatin (MEVACOR) 40 MG tablet take 1 tablet once a  day orally 90 days  . metoprolol tartrate (LOPRESSOR) 25 MG tablet Take 25 mg by mouth 2 (two) times daily.  . Misc Natural Products (TART CHERRY ADVANCED PO) Take by mouth.  . OXcarbazepine (TRILEPTAL) 150 MG tablet Take 1 tablet (150 mg total) by mouth 2 (two) times daily.  . Prenatal Vit-Fe Fumarate-FA (PRENATAL MULTIVITAMIN) TABS Take 1 tablet by mouth every morning.   . traMADol (ULTRAM) 50 MG tablet Take 50 mg by mouth 2 (two) times daily as needed.     Allergies:   Cymbalta [duloxetine hcl], Lipitor [atorvastatin], Tape, and Codeine   Social History   Tobacco Use    . Smoking status: Never Smoker  . Smokeless tobacco: Never Used  Substance Use Topics  . Alcohol use: No  . Drug use: No     Family History: The patient's family history includes Cancer in her brother and sister; Cancer - Prostate in her father; Heart disease in her brother; Heart failure in her mother.  ROS:   Please see the history of present illness.    All other systems reviewed and are negative.  EKGs/Labs/Other Studies Reviewed:    The following studies were reviewed today:  EKG:  NSR, LBBB  I have independently reviewed the images from ***.  Recent Labs: 04/14/2020: BUN 8; Creatinine, Ser 0.62; Potassium 4.4; Sodium 139  Recent Lipid Panel No results found for: CHOL, TRIG, HDL, CHOLHDL, VLDL, LDLCALC, LDLDIRECT  Physical Exam:    VS:  BP 132/80   Pulse (!) 54   Temp (!) 97.2 F (36.2 C)   Ht 5\' 4"  (1.626 m)   Wt 171 lb 12.8 oz (77.9 kg)   SpO2 90%   BMI 29.49 kg/m     Wt Readings from Last 5 Encounters:  06/01/20 171 lb 12.8 oz (77.9 kg)  04/17/20 176 lb (79.8 kg)  04/14/20 176 lb 6.4 oz (80 kg)  05/23/18 169 lb 12.8 oz (77 kg)  11/20/17 172 lb (78 kg)    Constitutional: No acute distress Eyes: sclera non-icteric, normal conjunctiva and lids ENMT: normal dentition, moist mucous membranes Cardiovascular: regular rhythm, normal rate, no murmurs. S1 and S2 normal. Radial pulses normal bilaterally. No jugular venous distention.  Respiratory: clear to auscultation bilaterally GI : normal bowel sounds, soft and nontender. No distention.   MSK: extremities warm, well perfused. No edema.  NEURO: grossly nonfocal exam, moves all extremities. PSYCH: alert and oriented x 3, normal mood and affect.   ASSESSMENT:    No diagnosis found. PLAN:    No diagnosis found.  Total time of encounter: *** minutes total time of encounter, including *** minutes spent in face-to-face patient care on the date of this encounter. This time includes coordination of care and  counseling regarding above mentioned problem list. Remainder of non-face-to-face time involved reviewing chart documents/testing relevant to the patient encounter and documentation in the medical record. I have independently reviewed documentation from referring provider.   Cherlynn Kaiser, MD [place]  [facility]    Medication Adjustments/Labs and Tests Ordered: Current medicines are reviewed at length with the patient today.  Concerns regarding medicines are outlined above.   No orders of the defined types were placed in this encounter.   No orders of the defined types were placed in this encounter.   There are no Patient Instructions on file for this visit.

## 2020-06-01 NOTE — Patient Instructions (Signed)
Medication Instructions:  PLEASE CALL OFFICE AFTER SEEING PCP AND YOU ARE READY TO START [place] *If you need a refill on your cardiac medications before your next appointment, please call your pharmacy*  Lab Work: None Ordered At This Time.  If you have labs (blood work) drawn today and your tests are completely normal, you will receive your results only by: Marland Kitchen MyChart Message (if you have MyChart) OR . A paper copy in the mail If you have any lab test that is abnormal or we need to change your treatment, we will call you to review the results.  Testing/Procedures: None Ordered At This Time.   Follow-Up: At [facility], you and your health needs are our priority.  As part of our continuing mission to provide you with exceptional heart care, we have created designated Provider Care Teams.  These Care Teams include your primary Cardiologist (physician) and Advanced Practice Providers (APPs -  Physician Assistants and Nurse Practitioners) who all work together to provide you with the care you need, when you need it.  We recommend signing up for the patient portal called "MyChart".  Sign up information is provided on this After Visit Summary.  MyChart is used to connect with patients for Virtual Visits (Telemedicine).  Patients are able to view lab/test results, encounter notes, upcoming appointments, etc.  Non-urgent messages can be sent to your provider as well.   To learn more about what you can do with MyChart, go to [email or web address].    Your next appointment:   3 month(s)  The format for your next appointment:   In Person  Provider:   You will see one of the following Advanced Practice Providers on your designated Care Team:    Rosaria Ferries, PA-C  Jory Sims, DNP, ANP

## 2020-06-02 NOTE — Progress Notes (Signed)
Cardiology Office Note:    Date:  06/01/2020   ID:  Terri Liu, DOB 1934-04-08, MRN [id number]  PCP:  Leeroy Cha, MD  Cardiologist:  No primary care provider on file.  Electrophysiologist:  None   Referring MD: Leeroy Cha,*   Chief Complaint/Reason for Referral: dyspnea  History of Present Illness:    Terri Liu is a 86 y.o. female with a history of carpal tunnel of the right wrist, hypertension, hyperlipidemia, anxiety who presents for evaluation and follow up of dyspnea.   Her daughter-in-law and great grandson (61 mo old) join her for the visit today.  She is feeling slightly, better but still mourning the loss of her son.   We reviewed stress test which was low risk. Discussed that low EF may be secondary to HTN, and we will need to have her follow BP closely and consider starting HF therapy if BP tolerates.  Past Medical History:  Diagnosis Date  . Arthritis   . Diarrhea    "sometimes ., due to Gallbladder removed"  . High cholesterol   . History of shingles    effecrt nerve sees neurologist in [facility]  . Hypertension   . Post herpetic neuralgia 08/15/2016   Right V1  . Shingles     Past Surgical History:  Procedure Laterality Date  . ABDOMINAL HYSTERECTOMY     COMPLETE  . CARPAL TUNNEL RELEASE Bilateral   . CHOLECYSTECTOMY    . EYE SURGERY     Cornea  . FEMUR IM NAIL Left 01/12/2017   Procedure: INTRAMEDULLARY (IM) NAIL FEMORAL LEFT;  Surgeon: Rod Can, MD;  Location: [facility];  Service: Orthopedics;  Laterality: Left;  Marland Kitchen GAS INSERTION Right 01/27/2015   Procedure: INSERTION OF GAS;  Surgeon: Hayden Pedro, MD;  Location: [place];  Service: Ophthalmology;  Laterality: Right;  C3F8  . JOINT REPLACEMENT Right 4 YRS AGO   KNEE  . KNEE ARTHROSCOPY Bilateral   . MEMBRANE PEEL Right 01/27/2015   Procedure: MEMBRANE PEEL;  Surgeon: Hayden Pedro, MD;  Location: [place];  Service: Ophthalmology;  Laterality: Right;  .  PERFLUORONE INJECTION Right 01/27/2015   Procedure: PERFLUORONE INJECTION;  Surgeon: Hayden Pedro, MD;  Location: [place];  Service: Ophthalmology;  Laterality: Right;  . PHOTOCOAGULATION WITH LASER Right 01/27/2015   Procedure: PHOTOCOAGULATION WITH LASER;  Surgeon: Hayden Pedro, MD;  Location: [place];  Service: Ophthalmology;  Laterality: Right;  ENDOLASER  . 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   Procedure: REPAIR OF COMPLEX TRACTION RETINAL DETACHMENT;  Surgeon: Hayden Pedro, MD;  Location: [place];  Service: Ophthalmology;  Laterality: Right;  . TOTAL KNEE ARTHROPLASTY Left 12/17/2015   Procedure: LEFT TOTAL KNEE ARTHROPLASTY;  Surgeon: Susa Day, MD;  [facility];  Service: Orthopedics;  Laterality: Left;  Marland Kitchen VITRECTOMY 25 GAUGE WITH SCLERAL BUCKLE Right 01/27/2015   Procedure: VITRECTOMY 25 GAUGE WITH SCLERAL BUCKLE;  Surgeon: Hayden Pedro, MD;  Location: [place];  Service: Ophthalmology;  Laterality: Right;    Current Medications: Current Meds  Medication Sig  . aspirin EC 81 MG EC tablet Take 1 tablet (81 mg total) by mouth 2 (two) times daily with a meal.  . BIOTIN FORTE PO Take by mouth.  . cholecalciferol (VITAMIN D) 1000 units tablet Take 1,000 Units by mouth daily.  . furosemide (LASIX) 20 MG tablet Take 1 tablet (20 mg total) by mouth daily.  Marland Kitchen lovastatin (MEVACOR) 40 MG tablet take 1  tablet once a day orally 90 days  . metoprolol tartrate (LOPRESSOR) 25 MG tablet Take 25 mg by mouth 2 (two) times daily.  . Misc Natural Products (TART CHERRY ADVANCED PO) Take by mouth.  . OXcarbazepine (TRILEPTAL) 150 MG tablet Take 1 tablet (150 mg total) by mouth 2 (two) times daily.  . Prenatal Vit-Fe Fumarate-FA (PRENATAL MULTIVITAMIN) TABS Take 1 tablet by mouth every morning.   . traMADol (ULTRAM) 50 MG tablet Take 50 mg by mouth 2 (two) times daily as needed.     Allergies:   Cymbalta [duloxetine hcl], Lipitor [atorvastatin], Tape, and Codeine    Social History   Tobacco Use  . Smoking status: Never Smoker  . Smokeless tobacco: Never Used  Substance Use Topics  . Alcohol use: No  . Drug use: No     Family History: The patient's family history includes Cancer in her brother and sister; Cancer - Prostate in her father; Heart disease in her brother; Heart failure in her mother.  ROS:   Please see the history of present illness.    All other systems reviewed and are negative.  EKGs/Labs/Other Studies Reviewed:    The following studies were reviewed today:  EKG:  NSR, LBBB  Recent Labs: 04/14/2020: BUN 8; Creatinine, Ser 0.62; Potassium 4.4; Sodium 139  Recent Lipid Panel No results found for: CHOL, TRIG, HDL, CHOLHDL, VLDL, LDLCALC, LDLDIRECT  Physical Exam:    VS:  BP 132/80   Pulse (!) 54   Temp (!) 97.2 F (36.2 C)   Ht 5\' 4"  (1.626 m)   Wt 171 lb 12.8 oz (77.9 kg)   SpO2 90%   BMI 29.49 kg/m     Wt Readings from Last 5 Encounters:  06/01/20 171 lb 12.8 oz (77.9 kg)  04/17/20 176 lb (79.8 kg)  04/14/20 176 lb 6.4 oz (80 kg)  05/23/18 169 lb 12.8 oz (77 kg)  11/20/17 172 lb (78 kg)    Constitutional: No acute distress Eyes: sclera non-icteric, normal conjunctiva and lids ENMT: normal dentition, moist mucous membranes Cardiovascular: regular rhythm, normal rate, no murmurs. S1 and S2 normal. Radial pulses normal bilaterally. No jugular venous distention.  Respiratory: clear to auscultation bilaterally GI : normal bowel sounds, soft and nontender. No distention.   MSK: extremities warm, well perfused. No edema.  NEURO: grossly nonfocal exam, moves all extremities. PSYCH: alert and oriented x 3, normal mood and affect.   ASSESSMENT:    1. Dyspnea on exertion   2. Left bundle branch block   3. HFrEF (heart failure with reduced ejection fraction) ([place])   4. Essential hypertension   5. Hyperlipidemia, unspecified hyperlipidemia type   6. Hollenhorst plaque    PLAN:    Dyspnea on exertion - stress  test low risk. Symptom improved without significant recurrence.  Left bundle branch block - Plan: EKG 12-Lead HFrEF (heart failure with reduced ejection fraction) (HCC) Essential hypertension - I would like to start losartan. She would like to review this with her PCP before starting. I would recommend losartan 25 mg daily for HTN and HF. Her low EF may be due to HTN.  Heart Failure Therapy ACE-I/ARB/ARNI: consider starting losartan  BB: metoprolol tartrate 25 mg BID, transition to metoprolol succinate or carvedilol at follow up.  MRA: none SGLT2I: none Diuretic plan: on lasix 20mg  daily, recommend taking at least 5 days a week to control LE swelling. She is quite worried about hyponatremia.   Hyperlipidemia, unspecified hyperlipidemia type Hollenhorst plaque - lipids  controlled on lovastatin 40 mg daily.   Total time of encounter: 37 minutes total time of encounter, including 30 minutes spent in face-to-face patient care on the date of this encounter. This time includes coordination of care and counseling regarding above mentioned problem list. Remainder of non-face-to-face time involved reviewing chart documents/testing relevant to the patient encounter and documentation in the medical record. I have independently reviewed documentation from referring provider.   Cherlynn Kaiser, MD [place]  [facility]    Medication Adjustments/Labs and Tests Ordered: Current medicines are reviewed at length with the patient today.  Concerns regarding medicines are outlined above.   Orders Placed This Encounter  Procedures  . EKG 12-Lead    No orders of the defined types were placed in this encounter.   Patient Instructions  Medication Instructions:  PLEASE CALL OFFICE AFTER SEEING PCP AND YOU ARE READY TO START LOSARTAN *If you need a refill on your cardiac medications before your next appointment, please call your pharmacy*  Lab Work: None Ordered At This Time.  If you have labs  (blood work) drawn today and your tests are completely normal, you will receive your results only by: Marland Kitchen MyChart Message (if you have MyChart) OR . A paper copy in the mail If you have any lab test that is abnormal or we need to change your treatment, we will call you to review the results.  Testing/Procedures: None Ordered At This Time.   Follow-Up: At [facility], you and your health needs are our priority.  As part of our continuing mission to provide you with exceptional heart care, we have created designated Provider Care Teams.  These Care Teams include your primary Cardiologist (physician) and Advanced Practice Providers (APPs -  Physician Assistants and Nurse Practitioners) who all work together to provide you with the care you need, when you need it.  We recommend signing up for the patient portal called "MyChart".  Sign up information is provided on this After Visit Summary.  MyChart is used to connect with patients for Virtual Visits (Telemedicine).  Patients are able to view lab/test results, encounter notes, upcoming appointments, etc.  Non-urgent messages can be sent to your provider as well.   To learn more about what you can do with MyChart, go to [email or web address].    Your next appointment:   3 month(s)  The format for your next appointment:   In Person  Provider:   You will see one of the following Advanced Practice Providers on your designated Care Team:    Rosaria Ferries, PA-C  Jory Sims, DNP, ANP

## 2020-06-17 DIAGNOSIS — F3341 Major depressive disorder, recurrent, in partial remission: Secondary | ICD-10-CM | POA: Diagnosis not present

## 2020-06-17 DIAGNOSIS — E7800 Pure hypercholesterolemia, unspecified: Secondary | ICD-10-CM | POA: Diagnosis not present

## 2020-06-17 DIAGNOSIS — I5023 Acute on chronic systolic (congestive) heart failure: Secondary | ICD-10-CM | POA: Diagnosis not present

## 2020-06-17 DIAGNOSIS — M810 Age-related osteoporosis without current pathological fracture: Secondary | ICD-10-CM | POA: Diagnosis not present

## 2020-06-17 DIAGNOSIS — I10 Essential (primary) hypertension: Secondary | ICD-10-CM | POA: Diagnosis not present

## 2020-06-17 DIAGNOSIS — M159 Polyosteoarthritis, unspecified: Secondary | ICD-10-CM | POA: Diagnosis not present

## 2020-06-24 DIAGNOSIS — Z23 Encounter for immunization: Secondary | ICD-10-CM | POA: Diagnosis not present

## 2020-06-24 DIAGNOSIS — F3341 Major depressive disorder, recurrent, in partial remission: Secondary | ICD-10-CM | POA: Diagnosis not present

## 2020-06-24 DIAGNOSIS — I10 Essential (primary) hypertension: Secondary | ICD-10-CM | POA: Diagnosis not present

## 2020-06-24 DIAGNOSIS — E7800 Pure hypercholesterolemia, unspecified: Secondary | ICD-10-CM | POA: Diagnosis not present

## 2020-06-24 DIAGNOSIS — I5023 Acute on chronic systolic (congestive) heart failure: Secondary | ICD-10-CM | POA: Diagnosis not present

## 2020-07-10 DIAGNOSIS — H34213 Partial retinal artery occlusion, bilateral: Secondary | ICD-10-CM | POA: Diagnosis not present

## 2020-07-10 DIAGNOSIS — H5310 Unspecified subjective visual disturbances: Secondary | ICD-10-CM | POA: Diagnosis not present

## 2020-07-10 DIAGNOSIS — H532 Diplopia: Secondary | ICD-10-CM | POA: Diagnosis not present

## 2020-07-21 DIAGNOSIS — M810 Age-related osteoporosis without current pathological fracture: Secondary | ICD-10-CM | POA: Diagnosis not present

## 2020-07-21 DIAGNOSIS — F3341 Major depressive disorder, recurrent, in partial remission: Secondary | ICD-10-CM | POA: Diagnosis not present

## 2020-07-21 DIAGNOSIS — M159 Polyosteoarthritis, unspecified: Secondary | ICD-10-CM | POA: Diagnosis not present

## 2020-07-21 DIAGNOSIS — I10 Essential (primary) hypertension: Secondary | ICD-10-CM | POA: Diagnosis not present

## 2020-07-21 DIAGNOSIS — I5023 Acute on chronic systolic (congestive) heart failure: Secondary | ICD-10-CM | POA: Diagnosis not present

## 2020-07-21 DIAGNOSIS — G8929 Other chronic pain: Secondary | ICD-10-CM | POA: Diagnosis not present

## 2020-07-21 DIAGNOSIS — E7800 Pure hypercholesterolemia, unspecified: Secondary | ICD-10-CM | POA: Diagnosis not present

## 2020-08-13 DIAGNOSIS — G894 Chronic pain syndrome: Secondary | ICD-10-CM | POA: Diagnosis not present

## 2020-08-13 DIAGNOSIS — M25512 Pain in left shoulder: Secondary | ICD-10-CM | POA: Diagnosis not present

## 2020-08-13 DIAGNOSIS — I10 Essential (primary) hypertension: Secondary | ICD-10-CM | POA: Diagnosis not present

## 2020-08-13 DIAGNOSIS — G8929 Other chronic pain: Secondary | ICD-10-CM | POA: Diagnosis not present

## 2020-08-19 DIAGNOSIS — M810 Age-related osteoporosis without current pathological fracture: Secondary | ICD-10-CM | POA: Diagnosis not present

## 2020-08-19 DIAGNOSIS — F3341 Major depressive disorder, recurrent, in partial remission: Secondary | ICD-10-CM | POA: Diagnosis not present

## 2020-08-19 DIAGNOSIS — I10 Essential (primary) hypertension: Secondary | ICD-10-CM | POA: Diagnosis not present

## 2020-08-19 DIAGNOSIS — M159 Polyosteoarthritis, unspecified: Secondary | ICD-10-CM | POA: Diagnosis not present

## 2020-08-19 DIAGNOSIS — G8929 Other chronic pain: Secondary | ICD-10-CM | POA: Diagnosis not present

## 2020-08-19 DIAGNOSIS — E7800 Pure hypercholesterolemia, unspecified: Secondary | ICD-10-CM | POA: Diagnosis not present

## 2020-08-19 DIAGNOSIS — I5023 Acute on chronic systolic (congestive) heart failure: Secondary | ICD-10-CM | POA: Diagnosis not present

## 2020-08-21 DIAGNOSIS — H532 Diplopia: Secondary | ICD-10-CM | POA: Diagnosis not present

## 2020-09-11 ENCOUNTER — Inpatient Hospital Stay (HOSPITAL_COMMUNITY)
Admission: EM | Admit: 2020-09-11 | Discharge: 2020-09-14 | DRG: 177 | Disposition: A | Discharge status: Home / Self Care | Payer: PPO | Attending: Internal Medicine | Admitting: Internal Medicine

## 2020-09-11 ENCOUNTER — Emergency Department (HOSPITAL_COMMUNITY): Payer: PPO

## 2020-09-11 ENCOUNTER — Other Ambulatory Visit: Payer: Self-pay

## 2020-09-11 DIAGNOSIS — E7800 Pure hypercholesterolemia, unspecified: Secondary | ICD-10-CM | POA: Diagnosis present

## 2020-09-11 DIAGNOSIS — I110 Hypertensive heart disease with heart failure: Secondary | ICD-10-CM | POA: Diagnosis present

## 2020-09-11 DIAGNOSIS — I447 Left bundle-branch block, unspecified: Secondary | ICD-10-CM | POA: Diagnosis present

## 2020-09-11 DIAGNOSIS — Z90710 Acquired absence of both cervix and uterus: Secondary | ICD-10-CM

## 2020-09-11 DIAGNOSIS — R739 Hyperglycemia, unspecified: Secondary | ICD-10-CM | POA: Diagnosis present

## 2020-09-11 DIAGNOSIS — R0902 Hypoxemia: Secondary | ICD-10-CM | POA: Diagnosis not present

## 2020-09-11 DIAGNOSIS — Z96652 Presence of left artificial knee joint: Secondary | ICD-10-CM | POA: Diagnosis present

## 2020-09-11 DIAGNOSIS — J1282 Pneumonia due to coronavirus disease 2019: Secondary | ICD-10-CM | POA: Diagnosis present

## 2020-09-11 DIAGNOSIS — Z79899 Other long term (current) drug therapy: Secondary | ICD-10-CM

## 2020-09-11 DIAGNOSIS — E785 Hyperlipidemia, unspecified: Secondary | ICD-10-CM | POA: Diagnosis present

## 2020-09-11 DIAGNOSIS — R0602 Shortness of breath: Secondary | ICD-10-CM

## 2020-09-11 DIAGNOSIS — D6489 Other specified anemias: Secondary | ICD-10-CM | POA: Diagnosis present

## 2020-09-11 DIAGNOSIS — Z7982 Long term (current) use of aspirin: Secondary | ICD-10-CM

## 2020-09-11 DIAGNOSIS — E878 Other disorders of electrolyte and fluid balance, not elsewhere classified: Secondary | ICD-10-CM | POA: Diagnosis present

## 2020-09-11 DIAGNOSIS — I5042 Chronic combined systolic (congestive) and diastolic (congestive) heart failure: Secondary | ICD-10-CM | POA: Diagnosis present

## 2020-09-11 DIAGNOSIS — U071 COVID-19: Secondary | ICD-10-CM | POA: Diagnosis present

## 2020-09-11 DIAGNOSIS — R531 Weakness: Secondary | ICD-10-CM

## 2020-09-11 DIAGNOSIS — J9621 Acute and chronic respiratory failure with hypoxia: Secondary | ICD-10-CM | POA: Diagnosis present

## 2020-09-11 LAB — CBC
HCT: 42.3 % (ref 36.0–46.0)
HCT: 43.3 % (ref 36.0–46.0)
Hemoglobin: 14 g/dL (ref 12.0–15.0)
Hemoglobin: 14.3 g/dL (ref 12.0–15.0)
MCH: 31.9 pg (ref 26.0–34.0)
MCH: 31.9 pg (ref 26.0–34.0)
MCHC: 33.1 g/dL (ref 30.0–36.0)
MCHC: 33 g/dL (ref 30.0–36.0)
MCV: 96.4 fL (ref 80.0–100.0)
MCV: 96.7 fL (ref 80.0–100.0)
Platelets: 226 10*3/uL (ref 150–400)
Platelets: 215 10*3/uL (ref 150–400)
RBC: 4.39 MIL/uL (ref 3.87–5.11)
RBC: 4.48 MIL/uL (ref 3.87–5.11)
RDW: 12.2 % (ref 11.5–15.5)
RDW: 12.2 % (ref 11.5–15.5)
WBC: 5 10*3/uL (ref 4.0–10.5)
WBC: 5.8 10*3/uL (ref 4.0–10.5)
nRBC: 0 % (ref 0.0–0.2)
nRBC: 0 % (ref 0.0–0.2)

## 2020-09-11 LAB — BASIC METABOLIC PANEL
Anion gap: 13 (ref 5–15)
BUN: 9 mg/dL (ref 8–23)
CO2: 26 mmol/L (ref 22–32)
Calcium: 9.2 mg/dL (ref 8.9–10.3)
Chloride: 97 mmol/L — ABNORMAL LOW (ref 98–111)
Creatinine, Ser: 0.57 mg/dL (ref 0.44–1.00)
GFR, Estimated: >60 mL/min (ref >60)
Glucose, Bld: 135 mg/dL — ABNORMAL HIGH (ref 70–99)
Potassium: 3.8 mmol/L (ref 3.5–5.1)
Sodium: 136 mmol/L (ref 135–145)

## 2020-09-11 LAB — RESP PANEL BY RT-PCR (FLU A&B, COVID) ARPGX2
Influenza A by PCR: NEGATIVE
Influenza B by PCR: NEGATIVE
SARS Coronavirus 2 by RT PCR: POSITIVE — AB

## 2020-09-11 LAB — D-DIMER, QUANTITATIVE: D-Dimer, Quant: 1.81 ug/mL-FEU — ABNORMAL HIGH (ref 0.00–0.50)

## 2020-09-11 LAB — LACTIC ACID, PLASMA
Lactic Acid, Venous: 1.7 mmol/L (ref 0.5–1.9)
Lactic Acid, Venous: 0.9 mmol/L (ref 0.5–1.9)

## 2020-09-11 LAB — PROCALCITONIN: Procalcitonin: <0.1 ng/mL

## 2020-09-11 LAB — FERRITIN: Ferritin: 226 ng/mL (ref 11–307)

## 2020-09-11 LAB — CREATININE, SERUM
Creatinine, Ser: 0.54 mg/dL (ref 0.44–1.00)
GFR, Estimated: >60 mL/min (ref >60)

## 2020-09-11 LAB — FIBRINOGEN: Fibrinogen: 401 mg/dL (ref 210–475)

## 2020-09-11 LAB — TRIGLYCERIDES: Triglycerides: 119 mg/dL (ref <150)

## 2020-09-11 LAB — LACTATE DEHYDROGENASE: LDH: 183 U/L (ref 98–192)

## 2020-09-11 LAB — C-REACTIVE PROTEIN: CRP: 0.6 mg/dL (ref <1.0)

## 2020-09-11 MED ORDER — PRENATAL MULTIVITAMIN CH
1.0000 | ORAL_TABLET | Freq: Every morning | ORAL | Status: DC
  Administered 2020-09-12 – 2020-09-14 (×3): 1 via ORAL
  Filled 2020-09-11 (×3): qty 1

## 2020-09-11 MED ORDER — ENOXAPARIN SODIUM 40 MG/0.4ML ~~LOC~~ SOLN
40.0000 mg | SUBCUTANEOUS | Status: DC
  Administered 2020-09-11 – 2020-09-13 (×3): 40 mg via SUBCUTANEOUS
  Filled 2020-09-11 (×3): qty 0.4

## 2020-09-11 MED ORDER — TRAMADOL HCL 50 MG PO TABS: 50.0000 mg | ORAL_TABLET | Freq: Four times a day (QID) | ORAL | Status: DC

## 2020-09-11 MED ORDER — TRAMADOL HCL 50 MG PO TABS: 50.0000 mg | ORAL_TABLET | Freq: Two times a day (BID) | ORAL | Status: DC | PRN

## 2020-09-11 MED ORDER — ASPIRIN EC 81 MG PO TBEC
81.0000 mg | DELAYED_RELEASE_TABLET | Freq: Two times a day (BID) | ORAL | Status: DC
  Administered 2020-09-12: 81 mg via ORAL
  Filled 2020-09-11: qty 1

## 2020-09-11 MED ORDER — SODIUM CHLORIDE 0.9 % IV SOLN: 250.0000 mL | INTRAVENOUS | Status: DC | PRN

## 2020-09-11 MED ORDER — SODIUM CHLORIDE 0.9% FLUSH: 3.0000 mL | INTRAVENOUS | Status: DC | PRN

## 2020-09-11 MED ORDER — VITAMIN D 25 MCG (1000 UNIT) PO TABS
1000.0000 [IU] | ORAL_TABLET | Freq: Every day | ORAL | Status: DC
  Administered 2020-09-11 – 2020-09-14 (×4): 1000 [IU] via ORAL
  Filled 2020-09-11 (×4): qty 1

## 2020-09-11 MED ORDER — SODIUM CHLORIDE 0.9% FLUSH
3.0000 mL | Freq: Two times a day (BID) | INTRAVENOUS | Status: DC
  Administered 2020-09-11 – 2020-09-13 (×5): 3 mL via INTRAVENOUS

## 2020-09-11 MED ORDER — METOPROLOL TARTRATE 25 MG PO TABS
25.0000 mg | ORAL_TABLET | Freq: Two times a day (BID) | ORAL | Status: DC
  Administered 2020-09-12 – 2020-09-14 (×5): 25 mg via ORAL
  Filled 2020-09-11 (×6): qty 1

## 2020-09-11 MED ORDER — OXCARBAZEPINE 150 MG PO TABS
150.0000 mg | ORAL_TABLET | Freq: Two times a day (BID) | ORAL | Status: DC
  Administered 2020-09-11 – 2020-09-14 (×6): 150 mg via ORAL
  Filled 2020-09-11 (×8): qty 1

## 2020-09-11 MED ORDER — PRAVASTATIN SODIUM 40 MG PO TABS
40.0000 mg | ORAL_TABLET | Freq: Every day | ORAL | Status: DC
  Administered 2020-09-12 – 2020-09-13 (×2): 40 mg via ORAL
  Filled 2020-09-11 (×2): qty 1

## 2020-09-11 NOTE — ED Provider Notes (Signed)
[facility] EMERGENCY DEPARTMENT Provider Note   CSN: [id number] Arrival date & time: 09/11/20  1513     History Chief Complaint  Patient presents with  . Shortness of Breath    Terri Liu is a 86 y.o. female.  HPI Patient is a 86 year old female with a medical history as noted below.  She presents today due to shortness of breath.  She states that she has been experiencing Covid-like symptoms for 9 days.  She was diagnosed with COVID-19 3 days ago.  She states her husband is currently admitted for COVID-19.  She states that over the past 2 days she has been experiencing increasing shortness of breath.  Denies any history of smoking, pulmonary issues, home oxygen requirement.  She spoke to her PCP who recommended that she come to the emergency department for evaluation.  She reports continued cough as well as diarrhea.  Also notes some chest pain when coughing but otherwise denies any chest pain at rest.  No abdominal pain, nausea, vomiting.  Patient has been vaccinated for COVID-19 but did not receive the booster.    Past Medical History:  Diagnosis Date  . Arthritis   . Diarrhea    "sometimes ., due to Gallbladder removed"  . High cholesterol   . History of shingles    effecrt nerve sees neurologist in [facility]  . Hypertension   . Post herpetic neuralgia 08/15/2016   Right V1  . Shingles     Patient Active Problem List   Diagnosis Date Noted  . Closed comminuted intertrochanteric fracture of left femur ([place]) 01/12/2017  . Closed intertrochanteric fracture, left, initial encounter ([place]) 01/11/2017  . Post herpetic neuralgia 08/15/2016  . Hereditary and idiopathic peripheral neuropathy 08/15/2016  . Primary osteoarthritis of left knee 12/17/2015  . Left knee DJD 12/17/2015  . 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 Epiretinal membrane 01/27/2015  . HYPERLIPIDEMIA 11/09/2006  .  OBESITY, NOS 11/09/2006  . DEPRESSION, MAJOR, RECURRENT 11/09/2006  . NEUROGENIC BLADDER 11/09/2006  . HYPERTENSION, BENIGN SYSTEMIC 11/09/2006  . ARTHRITIS 11/09/2006    Past Surgical History:  Procedure Laterality Date  . ABDOMINAL HYSTERECTOMY     COMPLETE  . CARPAL TUNNEL RELEASE Bilateral   . CHOLECYSTECTOMY    . EYE SURGERY     Cornea  . FEMUR IM NAIL Left 01/12/2017   Procedure: INTRAMEDULLARY (IM) NAIL FEMORAL LEFT;  Surgeon: Rod Can, MD;  [facility];  Service: Orthopedics;  Laterality: Left;  Marland Kitchen GAS INSERTION Right 01/27/2015   Procedure: INSERTION OF GAS;  Surgeon: Hayden Pedro, MD;  Location: [place];  Service: Ophthalmology;  Laterality: Right;  C3F8  . JOINT REPLACEMENT Right 4 YRS AGO   KNEE  . KNEE ARTHROSCOPY Bilateral   . MEMBRANE PEEL Right 01/27/2015   Procedure: MEMBRANE PEEL;  Surgeon: Hayden Pedro, MD;  Location: [place];  Service: Ophthalmology;  Laterality: Right;  . PERFLUORONE INJECTION Right 01/27/2015   Procedure: PERFLUORONE INJECTION;  Surgeon: Hayden Pedro, MD;  Location: [place];  Service: Ophthalmology;  Laterality: Right;  . PHOTOCOAGULATION WITH LASER Right 01/27/2015   Procedure: PHOTOCOAGULATION WITH LASER;  Surgeon: Hayden Pedro, MD;  Location: [place];  Service: Ophthalmology;  Laterality: Right;  ENDOLASER  . 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   Procedure: REPAIR OF COMPLEX TRACTION RETINAL DETACHMENT;  Surgeon: Hayden Pedro, MD;  Location: [place];  Service: Ophthalmology;  Laterality: Right;  . TOTAL KNEE ARTHROPLASTY Left 12/17/2015   Procedure: LEFT TOTAL KNEE ARTHROPLASTY;  Surgeon: Jene Every, MD;  [facility];  Service: Orthopedics;  Laterality: Left;  Marland Kitchen VITRECTOMY 25 GAUGE WITH SCLERAL BUCKLE Right 01/27/2015   Procedure: VITRECTOMY 25 GAUGE WITH SCLERAL BUCKLE;  Surgeon: Sherrie George, MD;  [facility];  Service: Ophthalmology;  Laterality: Right;     OB History   No obstetric  history on file.     Family History  Problem Relation Age of Onset  . Heart failure Mother   . Cancer - Prostate Father   . Heart disease Brother   . Cancer Sister   . Cancer Brother     Social History   Tobacco Use  . Smoking status: Never Smoker  . Smokeless tobacco: Never Used  Substance Use Topics  . Alcohol use: No  . Drug use: No    Home Medications Prior to Admission medications   Medication Sig Start Date End Date Taking? Authorizing Provider  aspirin EC 81 MG EC tablet Take 1 tablet (81 mg total) by mouth 2 (two) times daily with a meal. 01/13/17   Swinteck, Arlys John, MD  BIOTIN FORTE PO Take by mouth.    [provider]  cholecalciferol (VITAMIN D) 1000 units tablet Take 1,000 Units by mouth daily.    [provider]  furosemide (LASIX) 20 MG tablet Take 1 tablet (20 mg total) by mouth daily. 04/16/20 07/15/20  Parke Poisson, MD  lovastatin (MEVACOR) 40 MG tablet take 1 tablet once a day orally 90 days 07/29/16   [provider]  metoprolol tartrate (LOPRESSOR) 25 MG tablet Take 25 mg by mouth 2 (two) times daily. 05/23/16   [provider]  Misc Natural Products (TART CHERRY ADVANCED PO) Take by mouth.    [provider]  OXcarbazepine (TRILEPTAL) 150 MG tablet Take 1 tablet (150 mg total) by mouth 2 (two) times daily. 05/23/18   Nilda Riggs, NP  Prenatal Vit-Fe Fumarate-FA (PRENATAL MULTIVITAMIN) TABS Take 1 tablet by mouth every morning.     [provider]  traMADol (ULTRAM) 50 MG tablet Take 50 mg by mouth 2 (two) times daily as needed. 04/13/20   [provider]    Allergies    Cymbalta [duloxetine hcl], Lipitor [atorvastatin], Tape, and Codeine  Review of Systems   Review of Systems  All other systems reviewed and are negative. Ten systems reviewed and are negative for acute change, except as noted in the HPI.   Physical Exam Updated Vital Signs BP (!) 171/64   Pulse 66   Temp 99 F  (37.2 C) (Oral)   Resp 18   Ht 5\' 4"  (1.626 m)   Wt 78 kg   SpO2 95%   BMI 29.52 kg/m   Physical Exam Vitals and nursing note reviewed.  Constitutional:      General: She is not in acute distress.    Appearance: Normal appearance. She is well-developed and normal weight. She is not ill-appearing, toxic-appearing or diaphoretic.     Interventions: She is not intubated. HENT:     Head: Normocephalic and atraumatic.     Right Ear: External ear normal.     Left Ear: External ear normal.     Nose: Nose normal.     Mouth/Throat:     Mouth: Mucous membranes are moist.     Pharynx: Oropharynx is clear. No oropharyngeal exudate or posterior oropharyngeal erythema.  Eyes:  Extraocular Movements: Extraocular movements intact.  Cardiovascular:     Rate and Rhythm: Normal rate and regular rhythm.     Pulses: Normal pulses.     Heart sounds: Murmur heard.  No friction rub. No gallop.   Pulmonary:     Effort: Pulmonary effort is normal. No tachypnea, bradypnea, accessory muscle usage or respiratory distress. She is not intubated.     Breath sounds: No stridor. Examination of the right-lower field reveals rales. Examination of the left-lower field reveals rales. Rales present. No decreased breath sounds, wheezing or rhonchi.     Comments: Crackles noted in the bilateral lung bases.  Oxygen saturations in the mid 90s on room air at rest.  Patient desaturates to 85-86% when standing and conversing with me. Chest:     Chest wall: No tenderness.  Abdominal:     General: Abdomen is flat.     Palpations: Abdomen is soft.     Tenderness: There is no abdominal tenderness.  Musculoskeletal:        General: Normal range of motion.     Cervical back: Normal range of motion and neck supple. No tenderness.     Right lower leg: No tenderness. Edema present.     Left lower leg: No tenderness. Edema present.     Comments: Trace pedal edema noted bilaterally.  Skin:    General: Skin is warm and dry.   Neurological:     General: No focal deficit present.     Mental Status: She is alert and oriented to person, place, and time.  Psychiatric:        Mood and Affect: Mood normal.        Behavior: Behavior normal.    ED Results / Procedures / Treatments   Labs (all labs ordered are listed, but only abnormal results are displayed) Labs Reviewed  RESP PANEL BY RT-PCR (FLU A&B, COVID) ARPGX2 - Abnormal; Notable for the following components:      Result Value   SARS Coronavirus 2 by RT PCR POSITIVE (*)    All other components within normal limits  BASIC METABOLIC PANEL - Abnormal; Notable for the following components:   Chloride 97 (*)    Glucose, Bld 135 (*)    All other components within normal limits  D-DIMER, QUANTITATIVE (NOT AT [facility]) - Abnormal; Notable for the following components:   D-Dimer, Quant 1.81 (*)    All other components within normal limits  CULTURE, BLOOD (ROUTINE X 2)  CULTURE, BLOOD (ROUTINE X 2)  CBC  LACTIC ACID, PLASMA  PROCALCITONIN  LACTATE DEHYDROGENASE  FERRITIN  TRIGLYCERIDES  FIBRINOGEN  C-REACTIVE PROTEIN  LACTIC ACID, PLASMA   EKG EKG Interpretation  Date/Time:  Friday September 11 2020 15:11:13 EST Ventricular Rate:  71 PR Interval:  180 QRS Duration: 128 QT Interval:  430 QTC Calculation: 467 R Axis:   -34 Text Interpretation: Normal sinus rhythm Left axis deviation Left bundle branch block Abnormal ECG LBBBis new Confirmed by Benjiman Core [phone]) on 09/11/2020 3:25:22 PM   Radiology DG Chest Portable 1 View  Result Date: 09/11/2020 CLINICAL DATA:  Shortness of breath.  COVID positive. EXAM: PORTABLE CHEST 1 VIEW COMPARISON:  Jan 11, 2017 FINDINGS: The heart size and mediastinal contours are stable. The right hemidiaphragm is elevated unchanged. Both lungs are clear. The visualized skeletal structures are unremarkable. IMPRESSION: No active disease. Electronically Signed   By: Sherian Rein M.D.   On: 09/11/2020 15:45     Procedures Procedures (including critical care  time)  Medications Ordered in ED Medications - No data to display  ED Course  I have reviewed the triage vital signs and the nursing notes.  Pertinent labs & imaging results that were available during my care of the patient were reviewed by me and considered in my medical decision making (see chart for details).  Clinical Course as of 09/11/20 1753  Fri Sep 11, 2020  1609 DG Chest Portable 1 View IMPRESSION: No active disease. [LJ]  1751 SARS Coronavirus 2 by RT PCR(!): POSITIVE [LJ]  1752 D-Dimer, Quant(!): 1.81 [LJ]    Clinical Course User Index [LJ] Moody Bruins   MDM Rules/Calculators/A&P                          Patient is a 86 year old female who presents to the emergency department due to progressive shortness of breath secondary to COVID-19.  She has been experiencing symptoms for about 9 days.  She was diagnosed with COVID-19, 3 days ago.  Her husband is currently admitted for COVID-19.  Crackles noted in the bilateral lung bases.  Patient hypoxic to about 85% on room air when standing and conversing with me.  No history of pulmonary issues or home oxygen requirement.  Patient found to be positive for COVID-19 today.  Elevated D-dimer at 1.81.  CBC without leukocytosis.  Mild hypochloremia at 97 but otherwise electrolytes within normal limits.  Given patient's age, hypoxia, as well as her currently living alone with her husband being admitted to the hospital, will discuss with the hospitalist for admission at this time.  This was discussed with the patient and she is amenable.  Final Clinical Impression(s) / ED Diagnoses Final diagnoses:  COVID-19  Hypoxia    Rx / DC Orders ED Discharge Orders    None       Rayna Sexton, PA-C 09/11/20 1755    Maudie Flakes, MD 09/11/20 2352

## 2020-09-11 NOTE — H&P (Signed)
History and Physical  [name] IDP:[id number] DOB: 04/02/1934 DOA: 09/11/2020  Referring physician: Placido Sou, PA-C PCP: Lorenda Ishihara, MD  Outpatient Specialists:  Patient coming from: Home & is able to ambulate yes  Chief Complaint: Generalized weakness shortness of breath  HPI: Terri Liu is a 86 y.o. female with medical history significant for hypertension postherpetic neuralgia hypercholesterolemia She presents today due to shortness of breath.  She states that she has been experiencing Covid-like symptoms for 9 days.  She was diagnosed with COVID-19 3 days ago.  She states her husband is currently admitted for COVID-19.  She states that over the past 2 days she has been experiencing increasing shortness of breath.  Denies any history of smoking, pulmonary issues, home oxygen requirement.  She spoke to her PCP who recommended that she come to the emergency department for evaluation.  She reports continued cough as well as diarrhea.  Also notes some chest pain when coughing but otherwise denies any chest pain at rest.  No abdominal pain, nausea, vomiting.  Patient has been vaccinated for COVID-19 but did not receive the booster. Patient stated that she had diarrhea for the past 2 days up to yesterday but she has not had any diarrhea today. ED Course: Patient was noted to be hypoxic with exertion she desaturated to low upper 80s but she is currently on room air at rest saturating between 93 to 95% her D-dimer was 1.8.  She was noted to be feeling generally weak  Review of Systems:  Review of systems are otherwise negative   Past Medical History:  Diagnosis Date  . Arthritis   . Diarrhea    "sometimes ., due to Gallbladder removed"  . High cholesterol   . History of shingles    effecrt nerve sees neurologist in [facility]  . Hypertension   . Post herpetic neuralgia 08/15/2016   Right V1  . Shingles    Past Surgical History:  Procedure Laterality Date  .  ABDOMINAL HYSTERECTOMY     COMPLETE  . CARPAL TUNNEL RELEASE Bilateral   . CHOLECYSTECTOMY    . EYE SURGERY     Cornea  . FEMUR IM NAIL Left 01/12/2017   Procedure: INTRAMEDULLARY (IM) NAIL FEMORAL LEFT;  Surgeon: Samson Frederic, MD;  Location: [facility] ORS;  Service: Orthopedics;  Laterality: Left;  Marland Kitchen GAS INSERTION Right 01/27/2015   Procedure: INSERTION OF GAS;  Surgeon: Sherrie George, MD;  Location: [facility] OR;  Service: Ophthalmology;  Laterality: Right;  [id number]  . JOINT REPLACEMENT Right 4 YRS AGO   KNEE  . KNEE ARTHROSCOPY Bilateral   . MEMBRANE PEEL Right 01/27/2015   Procedure: MEMBRANE PEEL;  Surgeon: Sherrie George, MD;  Location: [facility] OR;  Service: Ophthalmology;  Laterality: Right;  . PERFLUORONE INJECTION Right 01/27/2015   Procedure: PERFLUORONE INJECTION;  Surgeon: Sherrie George, MD;  Location: [facility] OR;  Service: Ophthalmology;  Laterality: Right;  . PHOTOCOAGULATION WITH LASER Right 01/27/2015   Procedure: PHOTOCOAGULATION WITH LASER;  Surgeon: Sherrie George, MD;  Location: [facility] OR;  Service: Ophthalmology;  Laterality: Right;  ENDOLASER  . 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 TOTAL KNEE ARTHROPLASTY Left 12/17/2015   Procedure: LEFT TOTAL KNEE ARTHROPLASTY;  Surgeon: Jene Every, MD;  Location: [facility];  Service: Orthopedics;  Laterality: Left;  Marland Kitchen VITRECTOMY 25  GAUGE WITH SCLERAL BUCKLE Right 01/27/2015   Procedure: VITRECTOMY 25 GAUGE WITH SCLERAL BUCKLE;  Surgeon: Sherrie George, MD;  Location: [facility] OR;  Service: Ophthalmology;  Laterality: Right;    Social History:  reports that she has never smoked. She has never used smokeless tobacco. She reports that she does not drink alcohol and does not use drugs.   Allergies  Allergen Reactions  . Cymbalta [Duloxetine Hcl]     Made her very sick (flu-like symptoms)   . Lipitor [Atorvastatin] Other (See Comments)    Muscle pain in legs  . Tape     Pulled skin off. Paper tape is ok.   . Codeine Rash    Family History  Problem Relation Age of Onset  . Heart failure Mother   . Cancer - Prostate Father   . Heart disease Brother   . Cancer Sister   . Cancer Brother       Prior to Admission medications   Medication Sig Start Date End Date Taking? Authorizing Provider  aspirin EC 81 MG EC tablet Take 1 tablet (81 mg total) by mouth 2 (two) times daily with a meal. 01/13/17   Swinteck, Arlys John, MD  BIOTIN FORTE PO Take by mouth.    [provider]  cholecalciferol (VITAMIN D) 1000 units tablet Take 1,000 Units by mouth daily.    [provider]  furosemide (LASIX) 20 MG tablet Take 1 tablet (20 mg total) by mouth daily. 04/16/20 07/15/20  Parke Poisson, MD  lovastatin (MEVACOR) 40 MG tablet take 1 tablet once a day orally 90 days 07/29/16   [provider]  metoprolol tartrate (LOPRESSOR) 25 MG tablet Take 25 mg by mouth 2 (two) times daily. 05/23/16   [provider]  Misc Natural Products (TART CHERRY ADVANCED PO) Take by mouth.    [provider]  OXcarbazepine (TRILEPTAL) 150 MG tablet Take 1 tablet (150 mg total) by mouth 2 (two) times daily. 05/23/18   Nilda Riggs, NP  Prenatal Vit-Fe Fumarate-FA (PRENATAL MULTIVITAMIN) TABS Take 1 tablet by mouth every morning.     [provider]  traMADol (ULTRAM) 50 MG tablet Take 50 mg by mouth 2 (two) times daily as needed. 04/13/20   [provider]    Physical Exam: BP (!) 130/109   Pulse 62   Temp 99 F (37.2 C) (Oral)   Resp (!) 21   Ht 5\' 4"  (1.626 m)   Wt 78 kg   SpO2 93%   BMI 29.52 kg/m   Exam:  . General: 86 y.o. year-old female well developed well nourished in no acute distress.  Alert and oriented x3. . Cardiovascular: Regular rate and rhythm with no rubs or gallops.  No thyromegaly or JVD noted.   Marland Kitchen Respiratory:  Positive coughing spells clear to auscultation with no wheezes or rales. Good inspiratory effort. . Abdomen: Soft nontender nondistended with normal bowel sounds x4 quadrants. . Musculoskeletal: No lower extremity edema. 2/4 pulses in all 4 extremities. . Skin: No ulcerative lesions noted or rashes, . Psychiatry: Mood is appropriate for condition and setting           Labs on Admission:  Basic Metabolic Panel: Recent Labs  Lab 09/11/20 1530  NA 136  K 3.8  CL 97*  CO2 26  GLUCOSE 135*  BUN 9  CREATININE 0.57  CALCIUM 9.2   Liver Function Tests: No results for input(s): AST, ALT, ALKPHOS, BILITOT, PROT, ALBUMIN in the last 168 hours.  No results for input(s): LIPASE, AMYLASE in the last 168 hours. No results for input(s): AMMONIA in the last 168 hours. CBC: Recent Labs  Lab 09/11/20 1530  WBC 5.0  HGB 14.0  HCT 42.3  MCV 96.4  PLT 226   Cardiac Enzymes: No results for input(s): CKTOTAL, CKMB, CKMBINDEX, TROPONINI in the last 168 hours.  BNP (last 3 results) No results for input(s): BNP in the last 8760 hours.  ProBNP (last 3 results) No results for input(s): PROBNP in the last 8760 hours.  CBG: No results for input(s): GLUCAP in the last 168 hours.  Radiological Exams on Admission: DG Chest Portable 1 View  Result Date: 09/11/2020 CLINICAL DATA:  Shortness of breath.  COVID positive. EXAM: PORTABLE CHEST 1 VIEW COMPARISON:  Jan 11, 2017 FINDINGS: The heart size and mediastinal contours are stable. The right hemidiaphragm is elevated unchanged. Both lungs are clear. The visualized skeletal structures are unremarkable. IMPRESSION: No active disease. Electronically Signed   By: Abelardo Diesel M.D.   On: 09/11/2020 15:45    EKG: Independently reviewed.    Assessment/Plan Present on Admission: . COVID-19 virus infection . Acute on chronic respiratory failure with hypoxia ([place]) . COVID-19  Principal Problem:   COVID-19 virus infection Active Problems:   Acute  on chronic respiratory failure with hypoxia (HCC)   Generalized weakness   COVID-19   Severity of Illness: The appropriate patient status for this patient is OBSERVATION. Observation status is judged to be reasonable and necessary in order to provide the required intensity of service to ensure the patient's safety. The patient's presenting symptoms, physical exam findings, and initial radiographic and laboratory data in the context of their medical condition is felt to place them at decreased risk for further clinical deterioration. Furthermore, it is anticipated that the patient will be medically stable for discharge from the hospital within 2 midnights of admission. The following factors support the patient status of observation.   " The patient's presenting symptoms include hypoxia shortness of breath. " The physical exam findings include positive Covid. " The initial radiographic and laboratory data are positive Covid.     DVT prophylaxis: Lovenox  Code Status: Full  Family Communication: Patient  Disposition Plan: Home when stable  Consults called: None  Admission status: Observation    Cristal Deer MD [facility] Pager [phone]  If 7PM-7AM, please contact night-coverage [email or web address] Password [facility]  09/11/2020, 6:26 PM

## 2020-09-11 NOTE — ED Triage Notes (Signed)
Pt dx with Covid on 12/29 after feeling poorly for one week. Continues with shortness of breath, cough, and generalized weakness.

## 2020-09-12 DIAGNOSIS — Z90710 Acquired absence of both cervix and uterus: Secondary | ICD-10-CM | POA: Diagnosis not present

## 2020-09-12 DIAGNOSIS — Z79899 Other long term (current) drug therapy: Secondary | ICD-10-CM | POA: Diagnosis not present

## 2020-09-12 DIAGNOSIS — I447 Left bundle-branch block, unspecified: Secondary | ICD-10-CM | POA: Diagnosis present

## 2020-09-12 DIAGNOSIS — D6489 Other specified anemias: Secondary | ICD-10-CM | POA: Diagnosis present

## 2020-09-12 DIAGNOSIS — E7800 Pure hypercholesterolemia, unspecified: Secondary | ICD-10-CM | POA: Diagnosis present

## 2020-09-12 DIAGNOSIS — U071 COVID-19: Secondary | ICD-10-CM | POA: Diagnosis present

## 2020-09-12 DIAGNOSIS — R0602 Shortness of breath: Secondary | ICD-10-CM | POA: Diagnosis present

## 2020-09-12 DIAGNOSIS — I110 Hypertensive heart disease with heart failure: Secondary | ICD-10-CM | POA: Diagnosis present

## 2020-09-12 DIAGNOSIS — R739 Hyperglycemia, unspecified: Secondary | ICD-10-CM | POA: Diagnosis present

## 2020-09-12 DIAGNOSIS — Z96652 Presence of left artificial knee joint: Secondary | ICD-10-CM | POA: Diagnosis present

## 2020-09-12 DIAGNOSIS — E785 Hyperlipidemia, unspecified: Secondary | ICD-10-CM | POA: Diagnosis present

## 2020-09-12 DIAGNOSIS — Z7982 Long term (current) use of aspirin: Secondary | ICD-10-CM | POA: Diagnosis not present

## 2020-09-12 DIAGNOSIS — J9621 Acute and chronic respiratory failure with hypoxia: Secondary | ICD-10-CM | POA: Diagnosis present

## 2020-09-12 DIAGNOSIS — I5042 Chronic combined systolic (congestive) and diastolic (congestive) heart failure: Secondary | ICD-10-CM | POA: Diagnosis present

## 2020-09-12 DIAGNOSIS — E878 Other disorders of electrolyte and fluid balance, not elsewhere classified: Secondary | ICD-10-CM | POA: Diagnosis present

## 2020-09-12 DIAGNOSIS — J1282 Pneumonia due to coronavirus disease 2019: Secondary | ICD-10-CM | POA: Diagnosis present

## 2020-09-12 LAB — PROTIME-INR
INR: 1 (ref 0.8–1.2)
Prothrombin Time: 13.1 seconds (ref 11.4–15.2)

## 2020-09-12 LAB — APTT: aPTT: 30 seconds (ref 24–36)

## 2020-09-12 LAB — CBC
HCT: 44.7 % (ref 36.0–46.0)
Hemoglobin: 14.9 g/dL (ref 12.0–15.0)
MCH: 32 pg (ref 26.0–34.0)
MCHC: 33.3 g/dL (ref 30.0–36.0)
MCV: 96.1 fL (ref 80.0–100.0)
Platelets: 247 10*3/uL (ref 150–400)
RBC: 4.65 MIL/uL (ref 3.87–5.11)
RDW: 12.2 % (ref 11.5–15.5)
WBC: 5.2 10*3/uL (ref 4.0–10.5)
nRBC: 0 % (ref 0.0–0.2)

## 2020-09-12 MED ORDER — SODIUM CHLORIDE 0.9 % IV SOLN
200.0000 mg | Freq: Once | INTRAVENOUS | Status: AC
  Administered 2020-09-12: 200 mg via INTRAVENOUS
  Filled 2020-09-12: qty 40

## 2020-09-12 MED ORDER — METHYLPREDNISOLONE SODIUM SUCC 40 MG IJ SOLR
0.5000 mg/kg | Freq: Two times a day (BID) | INTRAMUSCULAR | Status: DC
  Administered 2020-09-12 – 2020-09-14 (×5): 39.2 mg via INTRAVENOUS
  Filled 2020-09-12 (×5): qty 1

## 2020-09-12 MED ORDER — GUAIFENESIN-DM 100-10 MG/5ML PO SYRP: 10.0000 mL | ORAL_SOLUTION | ORAL | Status: DC | PRN

## 2020-09-12 MED ORDER — PREDNISONE 20 MG PO TABS: 50.0000 mg | ORAL_TABLET | Freq: Every day | ORAL | Status: DC

## 2020-09-12 MED ORDER — SODIUM CHLORIDE 0.9 % IV SOLN
100.0000 mg | Freq: Every day | INTRAVENOUS | Status: DC
  Administered 2020-09-13 – 2020-09-14 (×2): 100 mg via INTRAVENOUS
  Filled 2020-09-12 (×2): qty 100

## 2020-09-12 MED ORDER — ESCITALOPRAM OXALATE 10 MG PO TABS: 10.0000 mg | ORAL_TABLET | Freq: Every day | ORAL | Status: DC | PRN

## 2020-09-12 MED ORDER — ASPIRIN EC 81 MG PO TBEC
81.0000 mg | DELAYED_RELEASE_TABLET | Freq: Every day | ORAL | Status: DC
Start: 2020-09-13 — End: 2020-09-14
  Administered 2020-09-13 – 2020-09-14 (×2): 81 mg via ORAL
  Filled 2020-09-12 (×2): qty 1

## 2020-09-12 MED ORDER — ALENDRONATE SODIUM 70 MG PO TABS
70.0000 mg | ORAL_TABLET | ORAL | Status: DC
Start: 2020-09-12 — End: 2020-09-12
  Administered 2020-09-12: 70 mg via ORAL
  Filled 2020-09-12: qty 1

## 2020-09-12 MED ORDER — ZINC SULFATE 220 (50 ZN) MG PO CAPS
220.0000 mg | ORAL_CAPSULE | Freq: Every day | ORAL | Status: DC
  Administered 2020-09-12 – 2020-09-14 (×3): 220 mg via ORAL
  Filled 2020-09-12 (×3): qty 1

## 2020-09-12 MED ORDER — VITAMIN B-12 100 MCG PO TABS
100.0000 ug | ORAL_TABLET | Freq: Every day | ORAL | Status: DC
  Administered 2020-09-12 – 2020-09-14 (×3): 100 ug via ORAL
  Filled 2020-09-12 (×3): qty 1

## 2020-09-12 MED ORDER — ASCORBIC ACID 500 MG PO TABS
500.0000 mg | ORAL_TABLET | Freq: Every day | ORAL | Status: DC
  Administered 2020-09-12 – 2020-09-14 (×3): 500 mg via ORAL
  Filled 2020-09-12 (×3): qty 1

## 2020-09-12 MED ORDER — ASCORBIC ACID 500 MG PO TABS: 500.0000 mg | ORAL_TABLET | Freq: Every day | ORAL | Status: DC

## 2020-09-12 MED ORDER — FLUTICASONE PROPIONATE 50 MCG/ACT NA SUSP
1.0000 | Freq: Every day | NASAL | Status: DC
  Administered 2020-09-13 – 2020-09-14 (×2): 1 via NASAL
  Filled 2020-09-12: qty 16

## 2020-09-12 NOTE — Progress Notes (Addendum)
Terri Liu  [id number] DOB: Feb 13, 1934 DOA: 09/11/2020 PCP: Leeroy Cha, MD    Brief Narrative:  87 year old with a history of HTN, postherpetic neuralgia, and HLD who presented to the ER with Covid-like symptoms for a total of 9 days and significantly worsening shortness of breath x2 days..  She was formally diagnosed with Covid 3 days prior, and her husband is presently admitted with Covid.  In the ED it was noted that she desaturated into the mid to upper 80s with exertion.  She was found to be severely weak in general.  Significant Events:  12/31 admit via [facility] ED  Date of Positive COVID Test:  12/28 & 12/31  Vaccination Status: Vaccinated x2 -no booster  COVID-19 specific Treatment: Remdesivir 1/1 > Solu-Medrol 1/1 >  Antimicrobials:  None  DVT prophylaxis: Lovenox  Subjective: Blood pressure stable.  Requiring 3 L nasal cannula oxygen to keep saturations in the mid to upper 90s but profoundly desaturates when asleep or with exertion.  Resting comfortably in bed at the time of my exam.  Denies severe shortness of breath while at rest.  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  Consultants:  none  Objective: Blood pressure (!) 116/55, pulse 63, temperature 99 F (37.2 C), temperature source Oral, resp. rate 14, height 5\' 4"  (1.626 m), weight 78 kg, SpO2 97 %. No intake or output data in the 24 hours ending 09/12/20 0755 Filed Weights   09/11/20 1519  Weight: 78 kg    Examination: General: No acute respiratory distress Lungs: Fine basilar crackles Cardiovascular: Regular rate and rhythm without murmur gallop or rub normal S1 and S2 Abdomen: Nontender, nondistended, soft, bowel sounds positive, no rebound, no ascites, no appreciable mass Extremities: No significant cyanosis, clubbing, or edema bilateral lower extremities  CBC: Recent Labs  Lab 09/11/20 1530 09/11/20 1950 09/12/20 0652  WBC 5.0 5.8 5.2  HGB 14.0 14.3 14.9  HCT 42.3 43.3 44.7  MCV 96.4 96.7 96.1  PLT 226 215 [id number]   Basic Metabolic Panel: Recent Labs  Lab 09/11/20 1530 09/11/20 1950  NA 136  --   K 3.8  --   CL 97*  --   CO2 26  --   GLUCOSE 135*  --  BUN 9  --   CREATININE 0.57 0.54  CALCIUM 9.2  --    GFR: Estimated Creatinine Clearance: 52.9 mL/min (by C-G formula based on SCr of 0.54 mg/dL).  Liver Function Tests: No results for input(s): AST, ALT, ALKPHOS, BILITOT, PROT, ALBUMIN in the last 168 hours. No results for input(s): LIPASE, AMYLASE in the last 168 hours. No results for input(s): AMMONIA in the last 168 hours.  HbA1C: No results found for: HGBA1C  CBG: No results for input(s): GLUCAP in the last 168 hours.  Recent Results (from the past 240 hour(s))  Resp Panel by RT-PCR (Flu A&B, Covid) Nasopharyngeal Swab     Status: Abnormal   Collection Time: 09/11/20  4:11 PM   Specimen:  Nasopharyngeal Swab; Nasopharyngeal(NP) swabs in vial transport medium  Result Value Ref Range Status   SARS Coronavirus 2 by RT PCR POSITIVE (A) NEGATIVE Final    Comment: RESULT CALLED TO, READ BACK BY AND VERIFIED WITH: Clarene Critchley RN [id number] 09/11/20 A BROWNING (NOTE) SARS-CoV-2 target nucleic acids are DETECTED.  The SARS-CoV-2 RNA is generally detectable in upper respiratory specimens during the acute phase of infection. Positive results are indicative of the presence of the identified virus, but do not rule out bacterial infection or co-infection with other pathogens not detected by the test. Clinical correlation with patient history and other diagnostic information is necessary to determine patient infection status. The expected result is Negative.  Fact Sheet for Patients: EntrepreneurPulse.com.au  Fact Sheet for Healthcare Providers: IncredibleEmployment.be  This test is not yet approved or cleared by the [place] FDA and  has been authorized for detection and/or diagnosis of SARS-CoV-2 by FDA under an Emergency Use Authorization (EUA).  This EUA will remain in effect (meaning this test ca n be used) for the duration of  the COVID-19 declaration under Section 564(b)(1) of the Act, 21 U.S.C. section 360bbb-3(b)(1), unless the authorization is terminated or revoked sooner.     Influenza A by PCR NEGATIVE NEGATIVE Final   Influenza B by PCR NEGATIVE NEGATIVE Final    Comment: (NOTE) The Xpert Xpress SARS-CoV-2/FLU/RSV plus assay is intended as an aid in the diagnosis of influenza from Nasopharyngeal swab specimens and should not be used as a sole basis for treatment. Nasal washings and aspirates are unacceptable for Xpert Xpress SARS-CoV-2/FLU/RSV testing.  Fact Sheet for Patients: EntrepreneurPulse.com.au  Fact Sheet for Healthcare Providers: IncredibleEmployment.be  This test is not yet  approved or cleared by the [place] FDA and has been authorized for detection and/or diagnosis of SARS-CoV-2 by FDA under an Emergency Use Authorization (EUA). This EUA will remain in effect (meaning this test can be used) for the duration of the COVID-19 declaration under Section 564(b)(1) of the Act, 21 U.S.C. section 360bbb-3(b)(1), unless the authorization is terminated or revoked.  Performed at [facility], [place] [address], [place], [place] [address]      Scheduled Meds: . aspirin EC  81 mg Oral BID WC  . cholecalciferol  1,000 Units Oral Daily  . enoxaparin (LOVENOX) injection  40 mg Subcutaneous Q24H  . metoprolol tartrate  25 mg Oral BID  . OXcarbazepine  150 mg Oral BID  . pravastatin  40 mg Oral q1800  . prenatal multivitamin  1 tablet Oral q morning - 10a  . sodium chloride flush  3 mL Intravenous Q12H   Continuous Infusions: . sodium chloride       LOS: 0 days   Cherene Altes, MD [facility]  Office  [phone] Pager - Text Page per Loretha Stapler  If 7PM-7AM, please contact night-coverage per Amion 09/12/2020, 7:55 AM

## 2020-09-12 NOTE — ED Notes (Signed)
Pt was placed on  3L due to constant desaturation of O2 to high 70's when pt falls asleep.

## 2020-09-12 NOTE — ED Notes (Signed)
Pt. OOB to commode

## 2020-09-12 NOTE — ED Notes (Signed)
Worked with PT

## 2020-09-12 NOTE — Evaluation (Signed)
Physical Therapy Evaluation Patient Details Name: Terri Liu MRN: [id number] DOB: [birth year] Today's Date: 09/12/2020   History of Present Illness  87yo female c/o SOB, and who tested positive for Covid 12/28. Admitted with acute on chronic respiratory failure with hypoxia, Covid 19. PMH HLD, hx shingles with post-herpetic neuralgia, HTN, L TKR  Clinical Impression   Patient received in ED stretcher, very pleasant and cooperative with PT. Able to get in/out of stretcher and transfer to/from commode without difficulty, also performed multiple rounds of lateral and forward/backward stepping in ED bay without difficulty or LOB. VSS on 3LPM, SpO2 no lower than 92% with activity today. Left sitting at side of stretcher with all needs met, nursing staff aware of patient status. Doing great mobility wise, at this point do not anticipate need for PT follow-up at DC.     Follow Up Recommendations No PT follow up    Equipment Recommendations  None recommended by PT    Recommendations for Other Services       Precautions / Restrictions Precautions Precautions: Other (comment) Precaution Comments: watch sats/HR, Covid + Restrictions Weight Bearing Restrictions: No      Mobility  Bed Mobility Overal bed mobility: Independent             General bed mobility comments: increased time due to being cautious with  lines, no physical assist given    Transfers Overall transfer level: Modified independent Equipment used: None             General transfer comment: safe and steady with transfers, good hand placement and no LOB noted with pivot to/from [facility]  Ambulation/Gait Ambulation/Gait assistance: Supervision           General Gait Details: limited by line length/covid room in ED, but able to take lateral and forward/backward side steps without device and general S, no LOB noted  Stairs            Wheelchair Mobility    Modified Rankin (Stroke Patients Only)        Balance Overall balance assessment: No apparent balance deficits (not formally assessed)                                           Pertinent Vitals/Pain Pain Assessment: No/denies pain    Home Living Family/patient expects to be discharged to:: Private residence Living Arrangements: Spouse/significant other;Other (Comment) (acts as caregiver for her husband) Available Help at Discharge: Family Type of Home: House Home Access: Stairs to enter Entrance Stairs-Rails: Right Entrance Stairs-Number of Steps: 4 Home Layout: One level Home Equipment: Shower seat      Prior Function Level of Independence: Independent               Hand Dominance        Extremity/Trunk Assessment   Upper Extremity Assessment Upper Extremity Assessment: Overall WFL for tasks assessed    Lower Extremity Assessment Lower Extremity Assessment: Overall WFL for tasks assessed    Cervical / Trunk Assessment Cervical / Trunk Assessment: Kyphotic  Communication      Cognition Arousal/Alertness: Awake/alert Behavior During Therapy: WFL for tasks assessed/performed Overall Cognitive Status: Within Functional Limits for tasks assessed  General Comments General comments (skin integrity, edema, etc.): VSS on 3LPM    Exercises     Assessment/Plan    PT Assessment Patient needs continued PT services  PT Problem List Decreased strength;Decreased activity tolerance;Decreased mobility;Cardiopulmonary status limiting activity       PT Treatment Interventions DME instruction;Balance training;Gait training;Stair training;Functional mobility training;Patient/family education;Therapeutic activities;Therapeutic exercise    PT Goals (Current goals can be found in the Care Plan section)  Acute Rehab PT Goals Patient Stated Goal: get out of here and go home PT Goal Formulation: With patient Time For Goal Achievement:  09/26/20 Potential to Achieve Goals: Good    Frequency Min 3X/week   Barriers to discharge        Co-evaluation               AM-PAC PT "6 Clicks" Mobility  Outcome Measure Help needed turning from your back to your side while in a flat bed without using bedrails?: None Help needed moving from lying on your back to sitting on the side of a flat bed without using bedrails?: None Help needed moving to and from a bed to a chair (including a wheelchair)?: None Help needed standing up from a chair using your arms (e.g., wheelchair or bedside chair)?: None Help needed to walk in hospital room?: A Little Help needed climbing 3-5 steps with a railing? : A Little 6 Click Score: 22    End of Session Equipment Utilized During Treatment: Oxygen Activity Tolerance: Patient tolerated treatment well Patient left: in bed;with call bell/phone within reach (sitting on side of ED stretcher per her request) Nurse Communication: Mobility status PT Visit Diagnosis: Muscle weakness (generalized) (M62.81);Difficulty in walking, not elsewhere classified (R26.2)    Time: 6269-4854 PT Time Calculation (min) (ACUTE ONLY): 18 min   Charges:   PT Evaluation $PT Eval Moderate Complexity: 1 Mod          Windell Norfolk, DPT, PN1   Supplemental Physical Therapist [place]    Pager [phone] Acute Rehab Office [phone]

## 2020-09-13 ENCOUNTER — Inpatient Hospital Stay (HOSPITAL_COMMUNITY): Payer: PPO

## 2020-09-13 LAB — CBC WITH DIFFERENTIAL/PLATELET
Abs Immature Granulocytes: 0.01 10*3/uL (ref 0.00–0.07)
Basophils Absolute: 0 10*3/uL (ref 0.0–0.1)
Basophils Relative: 0 %
Eosinophils Absolute: 0 10*3/uL (ref 0.0–0.5)
Eosinophils Relative: 0 %
HCT: 41.1 % (ref 36.0–46.0)
Hemoglobin: 14.8 g/dL (ref 12.0–15.0)
Immature Granulocytes: 0 %
Lymphocytes Relative: 20 %
Lymphs Abs: 1.1 10*3/uL (ref 0.7–4.0)
MCH: 33.5 pg (ref 26.0–34.0)
MCHC: 36 g/dL (ref 30.0–36.0)
MCV: 93 fL (ref 80.0–100.0)
Monocytes Absolute: 0.2 10*3/uL (ref 0.1–1.0)
Monocytes Relative: 3 %
Neutro Abs: 4.4 10*3/uL (ref 1.7–7.7)
Neutrophils Relative %: 77 %
Platelets: 257 10*3/uL (ref 150–400)
RBC: 4.42 MIL/uL (ref 3.87–5.11)
RDW: 12 % (ref 11.5–15.5)
WBC: 5.8 10*3/uL (ref 4.0–10.5)
nRBC: 0 % (ref 0.0–0.2)

## 2020-09-13 LAB — COMPREHENSIVE METABOLIC PANEL
ALT: 16 U/L (ref 0–44)
AST: 20 U/L (ref 15–41)
Albumin: 3.3 g/dL — ABNORMAL LOW (ref 3.5–5.0)
Alkaline Phosphatase: 59 U/L (ref 38–126)
Anion gap: 12 (ref 5–15)
BUN: 7 mg/dL — ABNORMAL LOW (ref 8–23)
CO2: 26 mmol/L (ref 22–32)
Calcium: 9.1 mg/dL (ref 8.9–10.3)
Chloride: 98 mmol/L (ref 98–111)
Creatinine, Ser: 0.49 mg/dL (ref 0.44–1.00)
GFR, Estimated: >60 mL/min (ref >60)
Glucose, Bld: 153 mg/dL — ABNORMAL HIGH (ref 70–99)
Potassium: 3.9 mmol/L (ref 3.5–5.1)
Sodium: 136 mmol/L (ref 135–145)
Total Bilirubin: 0.6 mg/dL (ref 0.3–1.2)
Total Protein: 6.1 g/dL — ABNORMAL LOW (ref 6.5–8.1)

## 2020-09-13 LAB — IRON AND TIBC
Iron: 89 ug/dL (ref 28–170)
Saturation Ratios: 29 % (ref 10.4–31.8)
TIBC: 304 ug/dL (ref 250–450)
UIBC: 215 ug/dL

## 2020-09-13 LAB — RETICULOCYTES
Immature Retic Fract: 12.2 % (ref 2.3–15.9)
RBC.: 4.61 MIL/uL (ref 3.87–5.11)
Retic Count, Absolute: 59.9 10*3/uL (ref 19.0–186.0)
Retic Ct Pct: 1.3 % (ref 0.4–3.1)

## 2020-09-13 LAB — C-REACTIVE PROTEIN: CRP: <0.5 mg/dL (ref <1.0)

## 2020-09-13 LAB — FERRITIN: Ferritin: 224 ng/mL (ref 11–307)

## 2020-09-13 LAB — MAGNESIUM: Magnesium: 1.7 mg/dL (ref 1.7–2.4)

## 2020-09-13 LAB — FOLATE: Folate: 37.6 ng/mL (ref >5.9)

## 2020-09-13 LAB — D-DIMER, QUANTITATIVE: D-Dimer, Quant: 1.37 ug/mL-FEU — ABNORMAL HIGH (ref 0.00–0.50)

## 2020-09-13 LAB — HEMOGLOBIN A1C
Hgb A1c MFr Bld: 5.5 % (ref 4.8–5.6)
Mean Plasma Glucose: 111.15 mg/dL

## 2020-09-13 LAB — VITAMIN B12: Vitamin B-12: 2259 pg/mL — ABNORMAL HIGH (ref 180–914)

## 2020-09-13 NOTE — Progress Notes (Signed)
Terri Liu  [id number] DOB: 1934-02-08 DOA: 09/11/2020 PCP: Leeroy Cha, MD    Brief Narrative:  87 year old with a history of HTN, postherpetic neuralgia, and HLD who presented to the ER with Covid-like symptoms for a total of 9 days and significantly worsening shortness of breath x2 days..  She was formally diagnosed with Covid 3 days prior, and her husband is presently admitted with Covid.  In the ED it was noted that she desaturated into the mid to upper 80s with exertion.  She was found to be severely weak in general.  Significant Events:  12/31 admit via [facility] ED  Date of Positive COVID Test:  12/28 & 12/31  Vaccination Status: Vaccinated x2 -no booster  COVID-19 specific Treatment: Remdesivir 1/1 > Solu-Medrol 1/1 >  Antimicrobials:  None  DVT prophylaxis: Lovenox  Subjective: Afebrile.  Saturations stable in upper 90s on 3 L support.  Blood pressure is elevated.  Her follow-up chest x-ray today, which I have reviewed personally, notes no significant change if not slight improvement and very mild diffuse infiltrate.  Patient reports she is feeling better overall and specifically significantly less short of breath.  Assessment & Plan:  Covid pneumonia with acute hypoxic respiratory failure only minimal diffuse infiltrate on CXR at time of admission with no significant change on follow-up CXR today - continue current management -mobilize -clinically showing signs of improvement  Recent Labs  Lab 09/11/20 1611 09/13/20 0308  DDIMER 1.81* 1.37*  FERRITIN 226 224  CRP 0.6 <0.5  ALT  --  16  PROCALCITON <0.10  --     Hyperglycemia Appreciated on BMP - A1c 5.5  Normocytic anemia No iron deficiency on anemia panel -folate and B12 are not low  Chronic combined systolic and diastolic congestive heart failure EF 40-45% via TTE April 2021 with grade 1 DD -no gross volume overload on exam - felt to be due to HTN - followed by [employer] [facility]  Chronic  LBBB   Code Status: FULL CODE Family Communication:  Status is: Observation  The patient will require care spanning > 2 midnights and should be moved to inpatient because: Inpatient level of care appropriate due to severity of illness  Dispo: The patient is from: Home              Anticipated d/c is to: unclear              Anticipated d/c date is: 3 days              Patient currently is not medically stable to d/c.  Consultants:  none  Objective: Blood pressure (!) 181/65, pulse 63, temperature 97.8 F (36.6 C), resp. rate 16, height 5\' 4"  (1.626 m), weight 78 kg, SpO2 97 %.  Intake/Output Summary (Last 24 hours) at 09/13/2020 0856 Last data filed at 09/12/2020 0943 Gross per 24 hour  Intake 3 ml  Output --  Net 3 ml   Filed Weights   09/11/20 1519  Weight: 78 kg    Examination: General: No acute respiratory distress Lungs: Fine basilar crackles without change -no wheeze Cardiovascular: RRR Abdomen: Soft, NT/ND, BS positive, no mass Extremities: No significant edema bilateral lower extremities  CBC: Recent Labs  Lab 09/11/20 1950 09/12/20 0652 09/13/20 0308  WBC 5.8 5.2 5.8  NEUTROABS  --   --  4.4  HGB 14.3 14.9 14.8  HCT 43.3 44.7 41.1  MCV 96.7 96.1 93.0  PLT 215 247 [id number]   Basic Metabolic Panel: Recent  Labs  Lab 09/11/20 1530 09/11/20 1950 09/13/20 0308  NA 136  --  136  K 3.8  --  3.9  CL 97*  --  98  CO2 26  --  26  GLUCOSE 135*  --  153*  BUN 9  --  7*  CREATININE 0.57 0.54 0.49  CALCIUM 9.2  --  9.1  MG  --   --  1.7   GFR: Estimated Creatinine Clearance: 52.9 mL/min (by C-G formula based on SCr of 0.49 mg/dL).  Liver Function Tests: Recent Labs  Lab 09/13/20 0308  AST 20  ALT 16  ALKPHOS 59  BILITOT 0.6  PROT 6.1*  ALBUMIN 3.3*    HbA1C: Hgb A1c MFr Bld  Date/Time Value Ref Range Status  09/13/2020 03:08 AM 5.5 4.8 - 5.6 % Final    Comment:    (NOTE) Pre diabetes:          5.7%-6.4%  Diabetes:               >6.4%  Glycemic control for   <7.0% adults with diabetes      Recent Results (from the past 240 hour(s))  Resp Panel by RT-PCR (Flu A&B, Covid) Nasopharyngeal Swab     Status: Abnormal   Collection Time: 09/11/20  4:11 PM   Specimen: Nasopharyngeal Swab; Nasopharyngeal(NP) swabs in vial transport medium  Result Value Ref Range Status   SARS Coronavirus 2 by RT PCR POSITIVE (A) NEGATIVE Final    Comment: RESULT CALLED TO, READ BACK BY AND VERIFIED WITH: Judie Petit CARDUFF RN [id number] 09/11/20 A BROWNING (NOTE) SARS-CoV-2 target nucleic acids are DETECTED.  The SARS-CoV-2 RNA is generally detectable in upper respiratory specimens during the acute phase of infection. Positive results are indicative of the presence of the identified virus, but do not rule out bacterial infection or co-infection with other pathogens not detected by the test. Clinical correlation with patient history and other diagnostic information is necessary to determine patient infection status. The expected result is Negative.  Fact Sheet for Patients: BloggerCourse.com  Fact Sheet for Healthcare Providers: SeriousBroker.it  This test is not yet approved or cleared by the [place] FDA and  has been authorized for detection and/or diagnosis of SARS-CoV-2 by FDA under an Emergency Use Authorization (EUA).  This EUA will remain in effect (meaning this test ca n be used) for the duration of  the COVID-19 declaration under Section 564(b)(1) of the Act, 21 U.S.C. section 360bbb-3(b)(1), unless the authorization is terminated or revoked sooner.     Influenza A by PCR NEGATIVE NEGATIVE Final   Influenza B by PCR NEGATIVE NEGATIVE Final    Comment: (NOTE) The Xpert Xpress SARS-CoV-2/FLU/RSV plus assay is intended as an aid in the diagnosis of influenza from Nasopharyngeal swab specimens and should not be used as a sole basis for treatment. Nasal washings and aspirates  are unacceptable for Xpert Xpress SARS-CoV-2/FLU/RSV testing.  Fact Sheet for Patients: BloggerCourse.com  Fact Sheet for Healthcare Providers: SeriousBroker.it  This test is not yet approved or cleared by the [place] FDA and has been authorized for detection and/or diagnosis of SARS-CoV-2 by FDA under an Emergency Use Authorization (EUA). This EUA will remain in effect (meaning this test can be used) for the duration of the COVID-19 declaration under Section 564(b)(1) of the Act, 21 U.S.C. section 360bbb-3(b)(1), unless the authorization is terminated or revoked.  Performed at [facility], [address], [place], [place] [address]   Blood Culture (routine  x 2)     Status: None (Preliminary result)   Collection Time: 09/11/20  4:11 PM   Specimen: BLOOD  Result Value Ref Range Status   Specimen Description BLOOD SITE NOT SPECIFIED  Final   Special Requests   Final    BOTTLES DRAWN AEROBIC AND ANAEROBIC Blood Culture results may not be optimal due to an inadequate volume of blood received in culture bottles   Culture   Final    NO GROWTH < 24 HOURS Performed at [facility], [address], [place], [place] [address]    Report Status PENDING  Incomplete  Blood Culture (routine x 2)     Status: None (Preliminary result)   Collection Time: 09/11/20  4:30 PM   Specimen: BLOOD  Result Value Ref Range Status   Specimen Description BLOOD SITE NOT SPECIFIED  Final   Special Requests   Final    BOTTLES DRAWN AEROBIC AND ANAEROBIC Blood Culture results may not be optimal due to an inadequate volume of blood received in culture bottles   Culture   Final    NO GROWTH < 24 HOURS Performed at [facility], [place] [address] [place], [place] [address]    Report Status PENDING  Incomplete     Scheduled Meds: . vitamin C  500 mg Oral Daily  . aspirin EC  81 mg Oral Daily  . cholecalciferol  1,000 Units Oral Daily   . enoxaparin (LOVENOX) injection  40 mg Subcutaneous Q24H  . fluticasone  1 spray Each Nare Daily  . methylPREDNISolone (SOLU-MEDROL) injection  0.5 mg/kg Intravenous Q12H   Followed by  . [START ON 09/15/2020] predniSONE  50 mg Oral Daily  . metoprolol tartrate  25 mg Oral BID  . OXcarbazepine  150 mg Oral BID  . pravastatin  40 mg Oral q1800  . prenatal multivitamin  1 tablet Oral q morning - 10a  . sodium chloride flush  3 mL Intravenous Q12H  . vitamin B-12  100 mcg Oral Daily  . zinc sulfate  220 mg Oral Daily   Continuous Infusions: . sodium chloride    . remdesivir 100 mg in NS 100 mL       LOS: 1 day   Cherene Altes, MD [facility] Office  [phone] Pager - Text Page per Amion  If 7PM-7AM, please contact night-coverage per Amion 09/13/2020, 8:56 AM

## 2020-09-13 NOTE — Plan of Care (Signed)
  Problem: Education: Goal: Knowledge of risk factors and measures for prevention of condition will improve Outcome: Progressing   Problem: Coping: Goal: Psychosocial and spiritual needs will be supported Outcome: Progressing   Problem: Respiratory: Goal: Will maintain a patent airway Outcome: Progressing Goal: Complications related to the disease process, condition or treatment will be avoided or minimized Outcome: Progressing   

## 2020-09-13 NOTE — Evaluation (Signed)
Occupational Therapy Evaluation Patient Details Name: Terri Liu MRN: [id number] DOB: 05-Nov-1933 Today's Date: 09/13/2020    History of Present Illness 86yo female c/o SOB, and who tested positive for Covid 12/28. Admitted with acute on chronic respiratory failure with hypoxia, Covid 19. PMH HLD, hx shingles with post-herpetic neuralgia, HTN, L TKR   Clinical Impression   Pt is typically independent in ADL and transfers, caregiver for husband (also currently hospitalized with covid currently) drives, etc. Today the patient was overall supervision for ADL - ambulatory without DME. Performing all activity on RA. With activity her SpO2 drops to 90%, at rest she was saturating in the mid-90's. Pt was very emotional during session about feeling overwhelmed by everything (COVID, several medical things with her children, and other events). Messaged Dr. Sharon Seller about getting a chaplain to come talk to her, and spend time with her. OT will follow acutely with focus on energy conservation, self-monitoring, and to continue to improve activity tolerance. Do not anticipate post-acute OT needs.     Follow Up Recommendations  No OT follow up;Supervision - Intermittent    Equipment Recommendations  3 in 1 bedside commode    Recommendations for Other Services       Precautions / Restrictions Precautions Precautions: Other (comment) (COVID) Precaution Comments: watch sats/HR, Covid + Restrictions Weight Bearing Restrictions: No      Mobility Bed Mobility               General bed mobility comments: OOB in recliner at beginning and end of session    Transfers Overall transfer level: Modified independent Equipment used: None             General transfer comment: safe and steady with transfers, good hand placement    Balance Overall balance assessment: No apparent balance deficits (not formally assessed)                                         ADL either  performed or assessed with clinical judgement   ADL Overall ADL's : Needs assistance/impaired Eating/Feeding: Modified independent Eating/Feeding Details (indicate cue type and reason): in recliner Grooming: Wash/dry hands;Oral care;Brushing hair;Supervision/safety;Standing Grooming Details (indicate cue type and reason): sink level Upper Body Bathing: Supervision/ safety   Lower Body Bathing: Supervison/ safety;Sitting/lateral leans   Upper Body Dressing : Supervision/safety   Lower Body Dressing: Supervision/safety   Toilet Transfer: Supervision/safety;Ambulation   Toileting- Clothing Manipulation and Hygiene: Modified independent;Sit to/from stand   Tub/ Shower Transfer: Walk-in shower;Supervision/safety;Ambulation   Functional mobility during ADLs: Supervision/safety       Vision Baseline Vision/History: Wears glasses Wears Glasses: At all times Patient Visual Report: No change from baseline Vision Assessment?: No apparent visual deficits     Perception     Praxis      Pertinent Vitals/Pain Pain Assessment: No/denies pain     Hand Dominance Right   Extremity/Trunk Assessment Upper Extremity Assessment Upper Extremity Assessment: Overall WFL for tasks assessed   Lower Extremity Assessment Lower Extremity Assessment: Overall WFL for tasks assessed   Cervical / Trunk Assessment Cervical / Trunk Assessment: Kyphotic   Communication Communication Communication: No difficulties   Cognition Arousal/Alertness: Awake/alert Behavior During Therapy: WFL for tasks assessed/performed Overall Cognitive Status: Within Functional Limits for tasks assessed  General Comments: Pt very emotional today - expressed feeling overwhelmed.   General Comments  VSS on RA, left on RA and SpO2 was saturating at 96% (drops down to 90% with activity)    Exercises     Shoulder Instructions      Home Living Family/patient expects to  be discharged to:: Private residence Living Arrangements: Spouse/significant other (is caregiver for husband) Available Help at Discharge: Family Type of Home: House Home Access: Stairs to enter Technical brewer of Steps: 4 Entrance Stairs-Rails: Right Home Layout: One level     Bathroom Shower/Tub: Occupational psychologist: Handicapped height Bathroom Accessibility: Yes How Accessible: Accessible via walker Home Equipment: Shower seat          Prior Functioning/Environment Level of Independence: Independent                 OT Problem List: Cardiopulmonary status limiting activity;Decreased activity tolerance;Decreased knowledge of use of DME or AE      OT Treatment/Interventions: Self-care/ADL training;Energy conservation;Therapeutic activities;Patient/family education;Balance training    OT Goals(Current goals can be found in the care plan section) Acute Rehab OT Goals Patient Stated Goal: get home to assist husband OT Goal Formulation: With patient Time For Goal Achievement: 09/27/20 Potential to Achieve Goals: Good ADL Goals Pt Will Perform Grooming: with modified independence;standing Pt Will Perform Upper Body Dressing: with modified independence Pt Will Perform Lower Body Dressing: with modified independence;sit to/from stand Pt Will Transfer to Toilet: with modified independence;ambulating Pt Will Perform Toileting - Clothing Manipulation and hygiene: with modified independence;sit to/from stand Additional ADL Goal #1: Pt will verbalize 3 strategies for energy conservation during ADL routine with no prompts  OT Frequency: Min 2X/week   Barriers to D/C:            Co-evaluation              AM-PAC OT "6 Clicks" Daily Activity     Outcome Measure Help from another person eating meals?: None Help from another person taking care of personal grooming?: None Help from another person toileting, which includes using toliet, bedpan, or  urinal?: None Help from another person bathing (including washing, rinsing, drying)?: A Little Help from another person to put on and taking off regular upper body clothing?: None Help from another person to put on and taking off regular lower body clothing?: None 6 Click Score: 23   End of Session Equipment Utilized During Treatment: Gait belt Nurse Communication: Mobility status;Other (comment) (SpO2 on RA, Chaplain consult)  Activity Tolerance: Patient tolerated treatment well Patient left: in chair;with call bell/phone within reach  OT Visit Diagnosis: Muscle weakness (generalized) (M62.81);Other (comment) (caridopulmonary status limiting activity)                Time: [id number] OT Time Calculation (min): 38 min Charges:  OT General Charges $OT Visit: 1 Visit OT Evaluation $OT Eval Moderate Complexity: 1 Mod OT Treatments $Self Care/Home Management : 8-22 mins $Therapeutic Activity: 8-22 mins  Jesse Sans OTR/L Acute Rehabilitation Services Pager: [phone] Office: [place] 09/13/2020, 4:27 PM

## 2020-09-14 ENCOUNTER — Ambulatory Visit: Payer: PPO | Admitting: Internal Medicine

## 2020-09-14 LAB — COMPREHENSIVE METABOLIC PANEL
ALT: 18 U/L (ref 0–44)
AST: 23 U/L (ref 15–41)
Albumin: 3.4 g/dL — ABNORMAL LOW (ref 3.5–5.0)
Alkaline Phosphatase: 56 U/L (ref 38–126)
Anion gap: 11 (ref 5–15)
BUN: 9 mg/dL (ref 8–23)
CO2: 30 mmol/L (ref 22–32)
Calcium: 9.3 mg/dL (ref 8.9–10.3)
Chloride: 95 mmol/L — ABNORMAL LOW (ref 98–111)
Creatinine, Ser: 0.56 mg/dL (ref 0.44–1.00)
GFR, Estimated: >60 mL/min (ref >60)
Glucose, Bld: 122 mg/dL — ABNORMAL HIGH (ref 70–99)
Potassium: 3.8 mmol/L (ref 3.5–5.1)
Sodium: 136 mmol/L (ref 135–145)
Total Bilirubin: 0.5 mg/dL (ref 0.3–1.2)
Total Protein: 6.2 g/dL — ABNORMAL LOW (ref 6.5–8.1)

## 2020-09-14 LAB — CBC WITH DIFFERENTIAL/PLATELET
Abs Immature Granulocytes: 0.03 10*3/uL (ref 0.00–0.07)
Basophils Absolute: 0 10*3/uL (ref 0.0–0.1)
Basophils Relative: 0 %
Eosinophils Absolute: 0 10*3/uL (ref 0.0–0.5)
Eosinophils Relative: 0 %
HCT: 43 % (ref 36.0–46.0)
Hemoglobin: 15.1 g/dL — ABNORMAL HIGH (ref 12.0–15.0)
Immature Granulocytes: 0 %
Lymphocytes Relative: 14 %
Lymphs Abs: 1.2 10*3/uL (ref 0.7–4.0)
MCH: 32.6 pg (ref 26.0–34.0)
MCHC: 35.1 g/dL (ref 30.0–36.0)
MCV: 92.9 fL (ref 80.0–100.0)
Monocytes Absolute: 0.4 10*3/uL (ref 0.1–1.0)
Monocytes Relative: 5 %
Neutro Abs: 6.9 10*3/uL (ref 1.7–7.7)
Neutrophils Relative %: 81 %
Platelets: 335 10*3/uL (ref 150–400)
RBC: 4.63 MIL/uL (ref 3.87–5.11)
RDW: 12.2 % (ref 11.5–15.5)
WBC: 8.5 10*3/uL (ref 4.0–10.5)
nRBC: 0 % (ref 0.0–0.2)

## 2020-09-14 LAB — C-REACTIVE PROTEIN: CRP: <0.5 mg/dL (ref <1.0)

## 2020-09-14 LAB — FERRITIN: Ferritin: 227 ng/mL (ref 11–307)

## 2020-09-14 LAB — D-DIMER, QUANTITATIVE: D-Dimer, Quant: 1.03 ug/mL-FEU — ABNORMAL HIGH (ref 0.00–0.50)

## 2020-09-14 MED ORDER — PREDNISONE 20 MG PO TABS: ORAL_TABLET | ORAL | 0 refills | Status: DC

## 2020-09-14 MED ORDER — ASPIRIN 81 MG PO TBEC: 81.0000 mg | DELAYED_RELEASE_TABLET | Freq: Every day | ORAL | Status: AC

## 2020-09-14 NOTE — Discharge Instructions (Signed)
Date of Positive COVID Test: 12/28  Date Quarantine Ends: 1/11    COVID-19 COVID-19 is a respiratory infection that is caused by a virus called severe acute respiratory syndrome coronavirus 2 (SARS-CoV-2). The disease is also known as coronavirus disease or novel coronavirus. In some people, the virus may not cause any symptoms. In others, it may cause a serious infection. The infection can get worse quickly and can lead to complications, such as:  Pneumonia, or infection of the lungs.  Acute respiratory distress syndrome or ARDS. This is a condition in which fluid build-up in the lungs prevents the lungs from filling with air and passing oxygen into the blood.  Acute respiratory failure. This is a condition in which there is not enough oxygen passing from the lungs to the body or when carbon dioxide is not passing from the lungs out of the body.  Sepsis or septic shock. This is a serious bodily reaction to an infection.  Blood clotting problems.  Secondary infections due to bacteria or fungus.  Organ failure. This is when your body's organs stop working. The virus that causes COVID-19 is contagious. This means that it can spread from person to person through droplets from coughs and sneezes (respiratory secretions). What are the causes? This illness is caused by a virus. You may catch the virus by:  Breathing in droplets from an infected person. Droplets can be spread by a person breathing, speaking, singing, coughing, or sneezing.  Touching something, like a table or a doorknob, that was exposed to the virus (contaminated) and then touching your mouth, nose, or eyes. What increases the risk? Risk for infection You are more likely to be infected with this virus if you:  Are within 6 feet (2 meters) of a person with COVID-19.  Provide care for or live with a person who is infected with COVID-19.  Spend time in crowded indoor spaces or live in shared housing. Risk for serious  illness You are more likely to become seriously ill from the virus if you:  Are 50 years of age or older. The higher your age, the more you are at risk for serious illness.  Live in a nursing home or long-term care facility.  Have cancer.  Have a long-term (chronic) disease such as: ? Chronic lung disease, including chronic obstructive pulmonary disease or asthma. ? A long-term disease that lowers your body's ability to fight infection (immunocompromised). ? Heart disease, including heart failure, a condition in which the arteries that lead to the heart become narrow or blocked (coronary artery disease), a disease which makes the heart muscle thick, weak, or stiff (cardiomyopathy). ? Diabetes. ? Chronic kidney disease. ? Sickle cell disease, a condition in which red blood cells have an abnormal "sickle" shape. ? Liver disease.  Are obese. What are the signs or symptoms? Symptoms of this condition can range from mild to severe. Symptoms may appear any time from 2 to 14 days after being exposed to the virus. They include:  A fever or chills.  A cough.  Difficulty breathing.  Headaches, body aches, or muscle aches.  Runny or stuffy (congested) nose.  A sore throat.  New loss of taste or smell. Some people may also have stomach problems, such as nausea, vomiting, or diarrhea. Other people may not have any symptoms of COVID-19. How is this diagnosed? This condition may be diagnosed based on:  Your signs and symptoms, especially if: ? You live in an area with a COVID-19 outbreak. ? You  recently traveled to or from an area where the virus is common. ? You provide care for or live with a person who was diagnosed with COVID-19. ? You were exposed to a person who was diagnosed with COVID-19.  A physical exam.  Lab tests, which may include: ? Taking a sample of fluid from the back of your nose and throat (nasopharyngeal fluid), your nose, or your throat using a swab. ? A sample  of mucus from your lungs (sputum). ? Blood tests.  Imaging tests, which may include, X-rays, CT scan, or ultrasound. How is this treated? At present, there is no medicine to treat COVID-19. Medicines that treat other diseases are being used on a trial basis to see if they are effective against COVID-19. Your health care provider will talk with you about ways to treat your symptoms. For most people, the infection is mild and can be managed at home with rest, fluids, and over-the-counter medicines. Treatment for a serious infection usually takes places in a hospital intensive care unit (ICU). It may include one or more of the following treatments. These treatments are given until your symptoms improve.  Receiving fluids and medicines through an IV.  Supplemental oxygen. Extra oxygen is given through a tube in the nose, a face mask, or a hood.  Positioning you to lie on your stomach (prone position). This makes it easier for oxygen to get into the lungs.  Continuous positive airway pressure (CPAP) or bi-level positive airway pressure (BPAP) machine. This treatment uses mild air pressure to keep the airways open. A tube that is connected to a motor delivers oxygen to the body.  Ventilator. This treatment moves air into and out of the lungs by using a tube that is placed in your windpipe.  Tracheostomy. This is a procedure to create a hole in the neck so that a breathing tube can be inserted.  Extracorporeal membrane oxygenation (ECMO). This procedure gives the lungs a chance to recover by taking over the functions of the heart and lungs. It supplies oxygen to the body and removes carbon dioxide. Follow these instructions at home: Lifestyle  If you are sick, stay home except to get medical care. Your health care provider will tell you how long to stay home. Call your health care provider before you go for medical care.  Rest at home as told by your health care provider.  Do not use any  products that contain nicotine or tobacco, such as cigarettes, e-cigarettes, and chewing tobacco. If you need help quitting, ask your health care provider.  Return to your normal activities as told by your health care provider. Ask your health care provider what activities are safe for you. General instructions  Take over-the-counter and prescription medicines only as told by your health care provider.  Drink enough fluid to keep your urine pale yellow.  Keep all follow-up visits as told by your health care provider. This is important. How is this prevented?  There is no vaccine to help prevent COVID-19 infection. However, there are steps you can take to protect yourself and others from this virus. To protect yourself:   Do not travel to areas where COVID-19 is a risk. The areas where COVID-19 is reported change often. To identify high-risk areas and travel restrictions, check the CDC travel website: FatFares.com.br  If you live in, or must travel to, an area where COVID-19 is a risk, take precautions to avoid infection. ? Stay away from people who are sick. ? Wash  your hands often with soap and water for 20 seconds. If soap and water are not available, use an alcohol-based hand sanitizer. ? Avoid touching your mouth, face, eyes, or nose. ? Avoid going out in public, follow guidance from your state and local health authorities. ? If you must go out in public, wear a cloth face covering or face mask. Make sure your mask covers your nose and mouth. ? Avoid crowded indoor spaces. Stay at least 6 feet (2 meters) away from others. ? Disinfect objects and surfaces that are frequently touched every day. This may include:  Counters and tables.  Doorknobs and light switches.  Sinks and faucets.  Electronics, such as phones, remote controls, keyboards, computers, and tablets. To protect others: If you have symptoms of COVID-19, take steps to prevent the virus from spreading to  others.  If you think you have a COVID-19 infection, contact your health care provider right away. Tell your health care team that you think you may have a COVID-19 infection.  Stay home. Leave your house only to seek medical care. Do not use public transport.  Do not travel while you are sick.  Wash your hands often with soap and water for 20 seconds. If soap and water are not available, use alcohol-based hand sanitizer.  Stay away from other members of your household. Let healthy household members care for children and pets, if possible. If you have to care for children or pets, wash your hands often and wear a mask. If possible, stay in your own room, separate from others. Use a different bathroom.  Make sure that all people in your household wash their hands well and often.  Cough or sneeze into a tissue or your sleeve or elbow. Do not cough or sneeze into your hand or into the air.  Wear a cloth face covering or face mask. Make sure your mask covers your nose and mouth. Where to find more information  Centers for Disease Control and Prevention: PurpleGadgets.be  World Health Organization: https://www.castaneda.info/ Contact a health care provider if:  You live in or have traveled to an area where COVID-19 is a risk and you have symptoms of the infection.  You have had contact with someone who has COVID-19 and you have symptoms of the infection. Get help right away if:  You have trouble breathing.  You have pain or pressure in your chest.  You have confusion.  You have bluish lips and fingernails.  You have difficulty waking from sleep.  You have symptoms that get worse. These symptoms may represent a serious problem that is an emergency. Do not wait to see if the symptoms will go away. Get medical help right away. Call your local emergency services (911 in the [place] Do not drive yourself to the hospital. Let the emergency medical  personnel know if you think you have COVID-19. Summary  COVID-19 is a respiratory infection that is caused by a virus. It is also known as coronavirus disease or novel coronavirus. It can cause serious infections, such as pneumonia, acute respiratory distress syndrome, acute respiratory failure, or sepsis.  The virus that causes COVID-19 is contagious. This means that it can spread from person to person through droplets from breathing, speaking, singing, coughing, or sneezing.  You are more likely to develop a serious illness if you are 38 years of age or older, have a weak immune system, live in a nursing home, or have chronic disease.  There is no medicine to treat COVID-19.  Your health care provider will talk with you about ways to treat your symptoms.  Take steps to protect yourself and others from infection. Wash your hands often and disinfect objects and surfaces that are frequently touched every day. Stay away from people who are sick and wear a mask if you are sick. This information is not intended to replace advice given to you by your health care provider. Make sure you discuss any questions you have with your health care provider. Document Revised: 06/28/2019 Document Reviewed: 10/04/2018 Elsevier Patient Education  2020 [employer].    COVID-19 Frequently Asked Questions COVID-19 (coronavirus disease) is an infection that is caused by a large family of viruses. Some viruses cause illness in people and others cause illness in animals like camels, cats, and bats. In some cases, the viruses that cause illness in animals can spread to humans. Where did the coronavirus come from? In December 2019, [place] told the [facility]) of several cases of lung disease (human respiratory illness). These cases were linked to an open seafood and livestock market in the city of [place]. The link to the seafood and livestock market suggests that the virus may have spread from animals  to humans. However, since that first outbreak in December, the virus has also been shown to spread from person to person. What is the name of the disease and the virus? Disease name Early on, this disease was called novel coronavirus. This is because scientists determined that the disease was caused by a new (novel) respiratory virus. The World Health Organization [facility]) has now named the disease COVID-19, or coronavirus disease. Virus name The virus that causes the disease is called severe acute respiratory syndrome coronavirus 2 (SARS-CoV-2). More information on disease and virus naming World Health Organization [facility]): www.who.int/emergencies/diseases/novel-coronavirus-2019/technical-guidance/naming-the-coronavirus-disease-(covid-2019)-and-the-virus-that-causes-it Who is at risk for complications from coronavirus disease? Some people may be at higher risk for complications from coronavirus disease. This includes older adults and people who have chronic diseases, such as heart disease, diabetes, and lung disease. If you are at higher risk for complications, take these extra precautions:  Stay home as much as possible.  Avoid social gatherings and travel.  Avoid close contact with others. Stay at least 6 ft (2 m) away from others, if possible.  Wash your hands often with soap and water for at least 20 seconds.  Avoid touching your face, mouth, nose, or eyes.  Keep supplies on hand at home, such as food, medicine, and cleaning supplies.  If you must go out in public, wear a cloth face covering or face mask. Make sure your mask covers your nose and mouth. How does coronavirus disease spread? The virus that causes coronavirus disease spreads easily from person to person (is contagious). You may catch the virus by:  Breathing in droplets from an infected person. Droplets can be spread by a person breathing, speaking, singing, coughing, or sneezing.  Touching something, like a table or a  doorknob, that was exposed to the virus (contaminated) and then touching your mouth, nose, or eyes. Can I get the virus from touching surfaces or objects? There is still a lot that we do not know about the virus that causes coronavirus disease. Scientists are basing a lot of information on what they know about similar viruses, such as:  Viruses cannot generally survive on surfaces for long. They need a human body (host) to survive.  It is more likely that the virus is spread by close contact with people who are  sick (direct contact), such as through: ? Shaking hands or hugging. ? Breathing in respiratory droplets that travel through the air. Droplets can be spread by a person breathing, speaking, singing, coughing, or sneezing.  It is less likely that the virus is spread when a person touches a surface or object that has the virus on it (indirect contact). The virus may be able to enter the body if the person touches a surface or object and then touches his or her face, eyes, nose, or mouth. Can a person spread the virus without having symptoms of the disease? It may be possible for the virus to spread before a person has symptoms of the disease, but this is most likely not the main way the virus is spreading. It is more likely for the virus to spread by being in close contact with people who are sick and breathing in the respiratory droplets spread by a person breathing, speaking, singing, coughing, or sneezing. What are the symptoms of coronavirus disease? Symptoms vary from person to person and can range from mild to severe. Symptoms may include:  Fever or chills.  Cough.  Difficulty breathing or feeling short of breath.  Headaches, body aches, or muscle aches.  Runny or stuffy (congested) nose.  Sore throat.  New loss of taste or smell.  Nausea, vomiting, or diarrhea. These symptoms can appear anywhere from 2 to 14 days after you have been exposed to the virus. Some people may not  have any symptoms. If you develop symptoms, call your health care provider. People with severe symptoms may need hospital care. Should I be tested for this virus? Your health care provider will decide whether to test you based on your symptoms, history of exposure, and your risk factors. How does a health care provider test for this virus? Health care providers will collect samples to send for testing. Samples may include:  Taking a swab of fluid from the back of your nose and throat, your nose, or your throat.  Taking fluid from the lungs by having you cough up mucus (sputum) into a sterile cup.  Taking a blood sample. Is there a treatment or vaccine for this virus? Currently, there is no vaccine to prevent coronavirus disease. Also, there are no medicines like antibiotics or antivirals to treat the virus. A person who becomes sick is given supportive care, which means rest and fluids. A person may also relieve his or her symptoms by using over-the-counter medicines that treat sneezing, coughing, and runny nose. These are the same medicines that a person takes for the common cold. If you develop symptoms, call your health care provider. People with severe symptoms may need hospital care. What can I do to protect myself and my family from this virus?     You can protect yourself and your family by taking the same actions that you would take to prevent the spread of other viruses. Take the following actions:  Wash your hands often with soap and water for at least 20 seconds. If soap and water are not available, use alcohol-based hand sanitizer.  Avoid touching your face, mouth, nose, or eyes.  Cough or sneeze into a tissue, sleeve, or elbow. Do not cough or sneeze into your hand or the air. ? If you cough or sneeze into a tissue, throw it away immediately and wash your hands.  Disinfect objects and surfaces that you frequently touch every day.  Stay away from people who are sick.  Avoid  going out  in public, follow guidance from your state and local health authorities.  Avoid crowded indoor spaces. Stay at least 6 ft (2 m) away from others.  If you must go out in public, wear a cloth face covering or face mask. Make sure your mask covers your nose and mouth.  Stay home if you are sick, except to get medical care. Call your health care provider before you get medical care. Your health care provider will tell you how long to stay home.  Make sure your vaccines are up to date. Ask your health care provider what vaccines you need. What should I do if I need to travel? Follow travel recommendations from your local health authority, the CDC, and WHO. Travel information and advice  Centers for Disease Control and Prevention (CDC): BodyEditor.hu  World Health Organization [facility]): ThirdIncome.ca Know the risks and take action to protect your health  You are at higher risk of getting coronavirus disease if you are traveling to areas with an outbreak or if you are exposed to travelers from areas with an outbreak.  Wash your hands often and practice good hygiene to lower the risk of catching or spreading the virus. What should I do if I am sick? General instructions to stop the spread of infection  Wash your hands often with soap and water for at least 20 seconds. If soap and water are not available, use alcohol-based hand sanitizer.  Cough or sneeze into a tissue, sleeve, or elbow. Do not cough or sneeze into your hand or the air.  If you cough or sneeze into a tissue, throw it away immediately and wash your hands.  Stay home unless you must get medical care. Call your health care provider or local health authority before you get medical care.  Avoid public areas. Do not take public transportation, if possible.  If you can, wear a mask if you must go out of the house or if you are in  close contact with someone who is not sick. Make sure your mask covers your nose and mouth. Keep your home clean  Disinfect objects and surfaces that are frequently touched every day. This may include: ? Counters and tables. ? Doorknobs and light switches. ? Sinks and faucets. ? Electronics such as phones, remote controls, keyboards, computers, and tablets.  Wash dishes in hot, soapy water or use a dishwasher. Air-dry your dishes.  Wash laundry in hot water. Prevent infecting other household members  Let healthy household members care for children and pets, if possible. If you have to care for children or pets, wash your hands often and wear a mask.  Sleep in a different bedroom or bed, if possible.  Do not share personal items, such as razors, toothbrushes, deodorant, combs, brushes, towels, and washcloths. Where to find more information Centers for Disease Control and Prevention (CDC)  Information and news updates: https://www.butler-gonzalez.com/ World Health Organization [facility])  Information and news updates: MissExecutive.com.ee  Coronavirus health topic: https://www.castaneda.info/  Questions and answers on COVID-19: OpportunityDebt.at  Global tracker: who.sprinklr.com American Academy of Pediatrics (AAP)  Information for families: www.healthychildren.org/English/health-issues/conditions/chest-lungs/Pages/2019-Novel-Coronavirus.aspx The coronavirus situation is changing rapidly. Check your local health authority website or the CDC and [facility] websites for updates and news. When should I contact a health care provider?  Contact your health care provider if you have symptoms of an infection, such as fever or cough, and you: ? Have been near anyone who is known to have coronavirus disease. ? Have come into contact with a person  who is suspected to have coronavirus disease. ? Have traveled to an area  where there is an outbreak of COVID-19. When should I get emergency medical care?  Get help right away by calling your local emergency services (911 in the [place] if you have: ? Trouble breathing. ? Pain or pressure in your chest. ? Confusion. ? Blue-tinged lips and fingernails. ? Difficulty waking from sleep. ? Symptoms that get worse. Let the emergency medical personnel know if you think you have coronavirus disease. Summary  A new respiratory virus is spreading from person to person and causing COVID-19 (coronavirus disease).  The virus that causes COVID-19 appears to spread easily. It spreads from one person to another through droplets from breathing, speaking, singing, coughing, or sneezing.  Older adults and those with chronic diseases are at higher risk of disease. If you are at higher risk for complications, take extra precautions.  There is currently no vaccine to prevent coronavirus disease. There are no medicines, such as antibiotics or antivirals, to treat the virus.  You can protect yourself and your family by washing your hands often, avoiding touching your face, and covering your coughs and sneezes. This information is not intended to replace advice given to you by your health care provider. Make sure you discuss any questions you have with your health care provider. Document Revised: 06/28/2019 Document Reviewed: 12/25/2018 Elsevier Patient Education  2020 Else[employer] Inc.   COVID-19: How to Protect Yourself and Others Know how it spreads  There is currently no vaccine to prevent coronavirus disease 2019 (COVID-19).  The best way to prevent illness is to avoid being exposed to this virus.  The virus is thought to spread mainly from person-to-person. ? Between people who are in close contact with one another (within about 6 feet). ? Through respiratory droplets produced when an infected person coughs, sneezes or talks. ? These droplets can land in the mouths or noses  of people who are nearby or possibly be inhaled into the lungs. ? COVID-19 may be spread by people who are not showing symptoms. Everyone should Clean your hands often  Wash your hands often with soap and water for at least 20 seconds especially after you have been in a public place, or after blowing your nose, coughing, or sneezing.  If soap and water are not readily available, use a hand sanitizer that contains at least 60% alcohol. Cover all surfaces of your hands and rub them together until they feel dry.  Avoid touching your eyes, nose, and mouth with unwashed hands. Avoid close contact  Limit contact with others as much as possible.  Avoid close contact with people who are sick.  Put distance between yourself and other people. ? Remember that some people without symptoms may be able to spread virus. ? This is especially important for people who are at higher risk of getting very RetroStamps.it Cover your mouth and nose with a mask when around others  You could spread COVID-19 to others even if you do not feel sick.  Everyone should wear a mask in public settings and when around people not living in their household, especially when social distancing is difficult to maintain. ? Masks should not be placed on young children under age 88, anyone who has trouble breathing, or is unconscious, incapacitated or otherwise unable to remove the mask without assistance.  The mask is meant to protect other people in case you are infected.  Do NOT use a facemask meant for a Research scientist (physical sciences).  Continue to keep about 6 feet between yourself and others. The mask is not a substitute for social distancing. Cover coughs and sneezes  Always cover your mouth and nose with a tissue when you cough or sneeze or use the inside of your elbow.  Throw used tissues in the trash.  Immediately wash your hands with soap and water for  at least 20 seconds. If soap and water are not readily available, clean your hands with a hand sanitizer that contains at least 60% alcohol. Clean and disinfect  Clean AND disinfect frequently touched surfaces daily. This includes tables, doorknobs, light switches, countertops, handles, desks, phones, keyboards, toilets, faucets, and sinks. RackRewards.fr  If surfaces are dirty, clean them: Use detergent or soap and water prior to disinfection.  Then, use a household disinfectant. You can see a list of EPA-registered household disinfectants here. michellinders.com 05/15/2019 This information is not intended to replace advice given to you by your health care provider. Make sure you discuss any questions you have with your health care provider. Document Revised: 05/23/2019 Document Reviewed: 03/21/2019 Elsevier Patient Education  [place].

## 2020-09-14 NOTE — Progress Notes (Signed)
   09/14/20 1510  Clinical Encounter Type  Visited With Other (Comment) (call)  Visit Type Spiritual support  Referral From Nurse  Consult/Referral To Chaplain  Chaplain responded to consult. Chaplain will call. Ok for nurse to call chaplain while at patient's bedside. This note was prepared by Deneen Harts, M.Div..  For questions please contact by phone [phone].

## 2020-09-14 NOTE — Discharge Summary (Signed)
DISCHARGE SUMMARY  ARDEEN NOSKO  MR#: [id number]  DOB:Aug 17, 1934  Date of Admission: 09/11/2020 Date of Discharge: 09/14/2020  Attending Physician:Bena Kobel Hennie Duos, MD  Patient's MF:[id number], Ronie Spies, MD  Consults: none   Disposition: D/C home   Date of Positive COVID Test: 12/28  Date Quarantine Ends: 1/11  COVID-19 specific Treatment: Remdesivir 1/1 > 1/3 Solu-Medrol / steroid 1/1 > 1/11  Follow-up Appts:  Follow-up Information    Leeroy Cha, MD Follow up in 10 day(s).   Specialty: Internal Medicine Contact information: [address] [place] [address] [phone]                Discharge Diagnoses: Covid pneumonia Acute hypoxic respiratory failure Hyperglycemia Normocytic anemia Chronic combined systolic and diastolic congestive heart failure  Initial presentation: 87 year old with a history of HTN, postherpetic neuralgia, and HLD who presented to the ER with Covid-like symptoms for a total of 9 days and significantly worsening shortness of breath x2 days. She was formally diagnosed with Covid 3 days prior, and her husband was at the time already admitted with Covid.  In the ED it was noted that she desaturated into the mid to upper 80s with exertion.  She was found to be severely weak in general.  Hospital Course:  Covid pneumonia with acute hypoxic respiratory failure only minimal diffuse infiltrate on CXR at time of admission with no significant change on follow-up CXR - treated w/ a short course of remdesivir and steroids - mobilized -clinically improved rapidly - was able to be titrated off O2 support even w/ exertion - cleared for d/c home to finish slow steroid taper   Hyperglycemia Appreciated on BMP - A1c 5.5  Normocytic anemia No iron deficiency on anemia panel -folate and B12 are not low  Chronic combined systolic and diastolic congestive heart failure EF 40-45% via TTE April 2021 with grade 1 DD -  no gross volume overload on exam - felt to be due to HTN - followed by [facility]  Chronic LBBB  Allergies as of 09/14/2020      Reactions   Amlodipine Besylate-valsartan    Other reaction(s): rash   Cymbalta [duloxetine Hcl]    Made her very sick (flu-like symptoms)   Hydrocodone-acetaminophen    Other reaction(s): rash   Lipitor [atorvastatin] Other (See Comments)   Muscle pain in legs   Other Other (See Comments)   Other reaction(s): OPIOID - analgesic unknown reaction   Tape    Pulled skin off. Paper tape is ok.    Codeine Rash      Medication List    STOP taking these medications   furosemide 20 MG tablet Commonly known as: LASIX     TAKE these medications   acetaminophen 500 MG tablet Commonly known as: TYLENOL Take 1,000 mg by mouth every 8 (eight) hours as needed for mild pain.   alendronate 70 MG tablet Commonly known as: FOSAMAX Take 70 mg by mouth once a week.   ARTIFICIAL TEARS OP Place 1 drop into both eyes daily as needed (dry eyes).   ascorbic acid 500 MG tablet Commonly known as: VITAMIN C Take 500 mg by mouth daily.   aspirin 81 MG EC tablet Take 1 tablet (81 mg total) by mouth daily.   BIOTIN FORTE PO Take 1 tablet by mouth daily at 12 noon.   cholecalciferol 1000 units tablet Commonly known as: VITAMIN D Take 1,000 Units by mouth daily.   escitalopram 10 MG tablet Commonly known  asLoma Sousa Take 10 mg by mouth daily as needed (mood).   fluticasone 50 MCG/ACT nasal spray Commonly known as: FLONASE Place 1 spray into both nostrils daily.   losartan 50 MG tablet Commonly known as: COZAAR Take 50 mg by mouth daily.   lovastatin 40 MG tablet Commonly known as: MEVACOR Take 40 mg by mouth daily.   metoprolol tartrate 25 MG tablet Commonly known as: LOPRESSOR Take 25 mg by mouth 2 (two) times daily.   OXcarbazepine 150 MG tablet Commonly known as: TRILEPTAL Take 1 tablet (150 mg total) by mouth 2 (two) times daily.   predniSONE  20 MG tablet Commonly known as: DELTASONE Take 2 tablets a day on 1/4 and 1/5 and 1/6 THEN 1 tablet a day on 1/7 and 1/8 and 1/9 THEN half a tablet a day on 1/10 and 1/11 THEN STOP   prenatal multivitamin Tabs tablet Take 1 tablet by mouth every morning.   TART CHERRY ADVANCED PO Take 1 tablet by mouth daily.   traMADol 50 MG tablet Commonly known as: ULTRAM Take 50 mg by mouth 2 (two) times daily as needed for moderate pain.   vitamin B-12 100 MCG tablet Commonly known as: CYANOCOBALAMIN Take 100 mcg by mouth daily.       Day of Discharge BP (!) 147/76 (BP Location: Right Arm)   Pulse 66   Temp 98.5 F (36.9 C) (Oral)   Resp 19   Ht 5\' 4"  (1.626 m)   Wt 78 kg   SpO2 94%   BMI 29.52 kg/m   Physical Exam: General: No acute respiratory distress Lungs: Clear to auscultation bilaterally without wheezes or crackles Cardiovascular: Regular rate and rhythm without murmur gallop or rub normal S1 and S2 Abdomen: Nontender, nondistended, soft, bowel sounds positive, no rebound, no ascites, no appreciable mass Extremities: No significant cyanosis, clubbing, or edema bilateral lower extremities  Basic Metabolic Panel: Recent Labs  Lab 09/11/20 1530 09/11/20 1950 09/13/20 0308 09/14/20 0948  NA 136  --  136 136  K 3.8  --  3.9 3.8  CL 97*  --  98 95*  CO2 26  --  26 30  GLUCOSE 135*  --  153* 122*  BUN 9  --  7* 9  CREATININE 0.57 0.54 0.49 0.56  CALCIUM 9.2  --  9.1 9.3  MG  --   --  1.7  --     Liver Function Tests: Recent Labs  Lab 09/13/20 0308 09/14/20 0948  AST 20 23  ALT 16 18  ALKPHOS 59 56  BILITOT 0.6 0.5  PROT 6.1* 6.2*  ALBUMIN 3.3* 3.4*    Coags: Recent Labs  Lab 09/12/20 0652  INR 1.0    CBC: Recent Labs  Lab 09/11/20 1530 09/11/20 1950 09/12/20 0652 09/13/20 0308 09/14/20 0948  WBC 5.0 5.8 5.2 5.8 8.5  NEUTROABS  --   --   --  4.4 6.9  HGB 14.0 14.3 14.9 14.8 15.1*  HCT 42.3 43.3 44.7 41.1 43.0  MCV 96.4 96.7 96.1 93.0 92.9   PLT 226 215 247 257 335     Recent Results (from the past 240 hour(s))  Resp Panel by RT-PCR (Flu A&B, Covid) Nasopharyngeal Swab     Status: Abnormal   Collection Time: 09/11/20  4:11 PM   Specimen: Nasopharyngeal Swab; Nasopharyngeal(NP) swabs in vial transport medium  Result Value Ref Range Status   SARS Coronavirus 2 by RT PCR POSITIVE (A) NEGATIVE Final    Comment: RESULT  CALLED TO, READ BACK BY AND VERIFIED WITH: Riley Kill RN [id number] 09/11/20 A BROWNING (NOTE) SARS-CoV-2 target nucleic acids are DETECTED.  The SARS-CoV-2 RNA is generally detectable in upper respiratory specimens during the acute phase of infection. Positive results are indicative of the presence of the identified virus, but do not rule out bacterial infection or co-infection with other pathogens not detected by the test. Clinical correlation with patient history and other diagnostic information is necessary to determine patient infection status. The expected result is Negative.  Fact Sheet for Patients: BloggerCourse.com  Fact Sheet for Healthcare Providers: SeriousBroker.it  This test is not yet approved or cleared by the [place] FDA and  has been authorized for detection and/or diagnosis of SARS-CoV-2 by FDA under an Emergency Use Authorization (EUA).  This EUA will remain in effect (meaning this test ca n be used) for the duration of  the COVID-19 declaration under Section 564(b)(1) of the Act, 21 U.S.C. section 360bbb-3(b)(1), unless the authorization is terminated or revoked sooner.     Influenza A by PCR NEGATIVE NEGATIVE Final   Influenza B by PCR NEGATIVE NEGATIVE Final    Comment: (NOTE) The Xpert Xpress SARS-CoV-2/FLU/RSV plus assay is intended as an aid in the diagnosis of influenza from Nasopharyngeal swab specimens and should not be used as a sole basis for treatment. Nasal washings and aspirates are unacceptable for Xpert Xpress  SARS-CoV-2/FLU/RSV testing.  Fact Sheet for Patients: BloggerCourse.com  Fact Sheet for Healthcare Providers: SeriousBroker.it  This test is not yet approved or cleared by the [place] FDA and has been authorized for detection and/or diagnosis of SARS-CoV-2 by FDA under an Emergency Use Authorization (EUA). This EUA will remain in effect (meaning this test can be used) for the duration of the COVID-19 declaration under Section 564(b)(1) of the Act, 21 U.S.C. section 360bbb-3(b)(1), unless the authorization is terminated or revoked.  Performed at [facility], [address], [place], [place] [address]   Blood Culture (routine x 2)     Status: None (Preliminary result)   Collection Time: 09/11/20  4:11 PM   Specimen: BLOOD  Result Value Ref Range Status   Specimen Description BLOOD SITE NOT SPECIFIED  Final   Special Requests   Final    BOTTLES DRAWN AEROBIC AND ANAEROBIC Blood Culture results may not be optimal due to an inadequate volume of blood received in culture bottles   Culture   Final    NO GROWTH 2 DAYS Performed at [facility], [address], [place], [place] [address]    Report Status PENDING  Incomplete  Blood Culture (routine x 2)     Status: None (Preliminary result)   Collection Time: 09/11/20  4:30 PM   Specimen: BLOOD  Result Value Ref Range Status   Specimen Description BLOOD SITE NOT SPECIFIED  Final   Special Requests   Final    BOTTLES DRAWN AEROBIC AND ANAEROBIC Blood Culture results may not be optimal due to an inadequate volume of blood received in culture bottles   Culture   Final    NO GROWTH 2 DAYS Performed at [facility], [address] [place], [place] [address]    Report Status PENDING  Incomplete      Time spent in discharge (includes decision making & examination of pt): 35 minutes  09/14/2020, 12:22 PM   Lonia Blood, MD [facility] Office   [phone]

## 2020-09-16 LAB — CULTURE, BLOOD (ROUTINE X 2)
Culture: NO GROWTH
Culture: NO GROWTH

## 2020-10-01 ENCOUNTER — Ambulatory Visit (INDEPENDENT_AMBULATORY_CARE_PROVIDER_SITE_OTHER): Payer: PPO | Admitting: Internal Medicine

## 2020-10-01 ENCOUNTER — Other Ambulatory Visit: Payer: Self-pay

## 2020-10-01 ENCOUNTER — Encounter: Payer: Self-pay | Admitting: Internal Medicine

## 2020-10-01 VITALS — BP 150/82 | HR 101 | Ht 64.0 in | Wt 167.0 lb

## 2020-10-01 DIAGNOSIS — I10 Essential (primary) hypertension: Secondary | ICD-10-CM | POA: Diagnosis not present

## 2020-10-01 DIAGNOSIS — I5020 Unspecified systolic (congestive) heart failure: Secondary | ICD-10-CM

## 2020-10-01 DIAGNOSIS — R0600 Dyspnea, unspecified: Secondary | ICD-10-CM

## 2020-10-01 DIAGNOSIS — R0609 Other forms of dyspnea: Secondary | ICD-10-CM

## 2020-10-01 DIAGNOSIS — H34219 Partial retinal artery occlusion, unspecified eye: Secondary | ICD-10-CM | POA: Diagnosis not present

## 2020-10-01 DIAGNOSIS — E785 Hyperlipidemia, unspecified: Secondary | ICD-10-CM | POA: Diagnosis not present

## 2020-10-01 DIAGNOSIS — I447 Left bundle-branch block, unspecified: Secondary | ICD-10-CM | POA: Diagnosis not present

## 2020-10-01 NOTE — Progress Notes (Signed)
Cardiology Office Note:    Date:  10/01/2020   ID:  Terri Liu, DOB 1934-07-27, MRN [id number]  PCP:  Leeroy Cha, MD  Cardiologist:  No primary care provider on file.  Electrophysiologist:  None   Referring MD: Leeroy Cha,*   Chief Complaint/Reason for Referral: Hospital follow up  History of Present Illness:    Terri Liu is a 87 y.o. female with a history of history of chronic systolic HF, LBBB, carpal tunnel of the right wrist, hypertension, hyperlipidemia, anxiety who presents for follow up after hospitalization for COVID 19 infection.   Mostly feeling ok after recovering from Covid. Mild SOB, some of which is baseline. O2 sats reasonable.   The patient denies chest pain, chest pressure, dyspnea at rest, palpitations, PND, orthopnea, or leg swelling. Denies cough, fever, chills. Denies nausea, vomiting. Denies syncope or presyncope. Denies dizziness or lightheadedness.    Past Medical History:  Diagnosis Date   Arthritis    Diarrhea    "sometimes ., due to Gallbladder removed"   High cholesterol    History of shingles    effecrt nerve sees neurologist in [facility]   Hypertension    Post herpetic neuralgia 08/15/2016   Right V1   Shingles     Past Surgical History:  Procedure Laterality Date   ABDOMINAL HYSTERECTOMY     COMPLETE   CARPAL TUNNEL RELEASE Bilateral    CHOLECYSTECTOMY     EYE SURGERY     Cornea   FEMUR IM NAIL Left 01/12/2017   Procedure: INTRAMEDULLARY (IM) NAIL FEMORAL LEFT;  Surgeon: Rod Can, MD;  Location: [facility];  Service: Orthopedics;  Laterality: Left;   GAS INSERTION Right 01/27/2015   Procedure: INSERTION OF GAS;  Surgeon: Hayden Pedro, MD;  Location: [place];  Service: Ophthalmology;  Laterality: Right;  C3F8   JOINT REPLACEMENT Right 4 YRS AGO   KNEE   KNEE ARTHROSCOPY Bilateral    MEMBRANE PEEL Right 01/27/2015   Procedure: MEMBRANE PEEL;  Surgeon: Hayden Pedro, MD;  Location: [place];  Service: Ophthalmology;  Laterality: Right;   PERFLUORONE INJECTION Right 01/27/2015   Procedure: PERFLUORONE INJECTION;  Surgeon: Hayden Pedro, MD;  Location: [place];  Service: Ophthalmology;  Laterality: Right;   PHOTOCOAGULATION WITH LASER Right 01/27/2015   Procedure: PHOTOCOAGULATION WITH LASER;  Surgeon: Hayden Pedro, MD;  Location: [place];  Service: Ophthalmology;  Laterality: Right;  ENDOLASER   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   Procedure: REPAIR OF COMPLEX TRACTION RETINAL DETACHMENT;  Surgeon: Hayden Pedro, MD;  Location: [place];  Service: Ophthalmology;  Laterality: Right;   TOTAL KNEE ARTHROPLASTY Left 12/17/2015   Procedure: LEFT TOTAL KNEE ARTHROPLASTY;  Surgeon: Susa Day, MD;  Location: [facility];  Service: Orthopedics;  Laterality: Left;   VITRECTOMY 25 GAUGE WITH SCLERAL BUCKLE Right 01/27/2015   Procedure: VITRECTOMY 25 GAUGE WITH SCLERAL BUCKLE;  Surgeon: Hayden Pedro, MD;  Location: [place];  Service: Ophthalmology;  Laterality: Right;    Current Medications: Current Meds  Medication Sig   acetaminophen (TYLENOL) 500 MG tablet Take 1,000 mg by mouth every 8 (eight) hours as needed for mild pain.   alendronate (FOSAMAX) 70 MG tablet Take 70 mg by mouth once a week.   ascorbic acid (VITAMIN C) 500 MG tablet Take 500 mg by mouth daily.   aspirin 81 MG EC tablet Take 1 tablet (81 mg total) by mouth daily.   BIOTIN FORTE PO Take  1 tablet by mouth daily at 12 noon.   Carboxymethylcellulose Sodium (ARTIFICIAL TEARS OP) Place 1 drop into both eyes daily as needed (dry eyes).   cholecalciferol (VITAMIN D) 1000 units tablet Take 1,000 Units by mouth daily.   escitalopram (LEXAPRO) 10 MG tablet Take 10 mg by mouth daily as needed (mood).   fluticasone (FLONASE) 50 MCG/ACT nasal spray Place 1 spray into both nostrils daily.   losartan (COZAAR) 50 MG tablet Take 50 mg by mouth daily.   lovastatin (MEVACOR) 40 MG tablet Take 40 mg  by mouth daily.   metoprolol tartrate (LOPRESSOR) 25 MG tablet Take 25 mg by mouth 2 (two) times daily.   Misc Natural Products (TART CHERRY ADVANCED PO) Take 1 tablet by mouth daily.   OXcarbazepine (TRILEPTAL) 150 MG tablet Take 1 tablet (150 mg total) by mouth 2 (two) times daily.   predniSONE (DELTASONE) 20 MG tablet Take 2 tablets a day on 1/4 and 1/5 and 1/6 THEN 1 tablet a day on 1/7 and 1/8 and 1/9 THEN half a tablet a day on 1/10 and 1/11 THEN STOP   Prenatal Vit-Fe Fumarate-FA (PRENATAL MULTIVITAMIN) TABS Take 1 tablet by mouth every morning.    traMADol (ULTRAM) 50 MG tablet Take 50 mg by mouth 2 (two) times daily as needed for moderate pain.   vitamin B-12 (CYANOCOBALAMIN) 100 MCG tablet Take 100 mcg by mouth daily.     Allergies:   Amlodipine besylate-valsartan, Cymbalta [duloxetine hcl], Hydrocodone-acetaminophen, Lipitor [atorvastatin], Other, Tape, and Codeine   Social History   Tobacco Use   Smoking status: Never Smoker   Smokeless tobacco: Never Used  Substance Use Topics   Alcohol use: No   Drug use: No     Family History: The patient's family history includes Cancer in her brother and sister; Cancer - Prostate in her father; Heart disease in her brother; Heart failure in her mother.  ROS:   Please see the history of present illness.    All other systems reviewed and are negative.  EKGs/Labs/Other Studies Reviewed:    The following studies were reviewed today:  EKG:  SINUS TACHYCARDIA, LBBB, RATE 101   Recent Labs: 09/13/2020: Magnesium 1.7 09/14/2020: ALT 18; BUN 9; Creatinine, Ser 0.56; Hemoglobin 15.1; Platelets 335; Potassium 3.8; Sodium 136  Recent Lipid Panel    Component Value Date/Time   TRIG 119 09/11/2020 1611    Physical Exam:    VS:  BP (!) 150/82    Pulse (!) 101    Ht 5\' 4"  (1.626 m)    Wt 167 lb (75.8 kg)    SpO2 96%    BMI 28.67 kg/m     Wt Readings from Last 5 Encounters:  10/01/20 167 lb (75.8 kg)  09/11/20 172 lb (78 kg)   06/01/20 171 lb 12.8 oz (77.9 kg)  04/17/20 176 lb (79.8 kg)  04/14/20 176 lb 6.4 oz (80 kg)    Constitutional: No acute distress Eyes: sclera non-icteric, normal conjunctiva and lids ENMT: normal dentition, moist mucous membranes Cardiovascular: regular rhythm, normal rate, no murmurs. S1 and S2 normal. Radial pulses normal bilaterally. No jugular venous distention.  Respiratory: clear to auscultation bilaterally GI : normal bowel sounds, soft and nontender. No distention.   MSK: extremities warm, well perfused. No edema.  NEURO: grossly nonfocal exam, moves all extremities. PSYCH: alert and oriented x 3, normal mood and affect.   ASSESSMENT:    1. Essential hypertension   2. Dyspnea on exertion   3. Left  bundle branch block   4. HFrEF (heart failure with reduced ejection fraction) ([place])   5. Hyperlipidemia, unspecified hyperlipidemia type   6. Hollenhorst plaque    PLAN:    Dyspnea on exertion - recovering from COVID, mild SOB currently, improving.   Left bundle branch block HFrEF (heart failure with reduced ejection fraction) (HCC) Essential hypertension Heart Failure Therapy ACE-I/ARB/ARNI: losartan 50 mg daily BB: metoprolol tartrate 25 mg BID, transition to metoprolol succinate or carvedilol at follow up.  MRA: none SGLT2I: none Diuretic plan: no longer on lasix, stopped in hospital.  BP still elevated, would like to titrate therapy if still elevated at next follow up. Would add spironolactone next.   Hyperlipidemia, unspecified hyperlipidemia type Hollenhorst plaque - lipids controlled on lovastatin 40 mg daily.   Total time of encounter: 30 minutes total time of encounter, including 24 minutes spent in face-to-face patient care on the date of this encounter. This time includes coordination of care and counseling regarding above mentioned problem list. Remainder of non-face-to-face time involved reviewing chart documents/testing relevant to the patient encounter  and documentation in the medical record. I have independently reviewed documentation from referring provider.   Cherlynn Kaiser, MD [place]   [facility]    Medication Adjustments/Labs and Tests Ordered: Current medicines are reviewed at length with the patient today.  Concerns regarding medicines are outlined above.   Orders Placed This Encounter  Procedures   EKG 12-Lead    No orders of the defined types were placed in this encounter.   Patient Instructions  Medication Instructions:  No Changes In Medications at this time.  *If you need a refill on your cardiac medications before your next appointment, please call your pharmacy*  Follow-Up: At [facility], you and your health needs are our priority.  As part of our continuing mission to provide you with exceptional heart care, we have created designated Provider Care Teams.  These Care Teams include your primary Cardiologist (physician) and Advanced Practice Providers (APPs -  Physician Assistants and Nurse Practitioners) who all work together to provide you with the care you need, when you need it.  We recommend signing up for the patient portal called "MyChart".  Sign up information is provided on this After Visit Summary.  MyChart is used to connect with patients for Virtual Visits (Telemedicine).  Patients are able to view lab/test results, encounter notes, upcoming appointments, etc.  Non-urgent messages can be sent to your provider as well.   To learn more about what you can do with MyChart, go to [email or web address].    Your next appointment:   6 month(s)  The format for your next appointment:   In [place]  Provider:   Cherlynn Kaiser, MD

## 2020-10-01 NOTE — Patient Instructions (Signed)
Medication Instructions:  °No Changes In Medications at this time.  °*If you need a refill on your cardiac medications before your next appointment, please call your pharmacy* ° °Follow-Up: °At [facility], you and your health needs are our priority.  As part of our continuing mission to provide you with exceptional heart care, we have created designated Provider Care Teams.  These Care Teams include your primary Cardiologist (physician) and Advanced Practice Providers (APPs -  Physician Assistants and Nurse Practitioners) who all work together to provide you with the care you need, when you need it. ° °We recommend signing up for the patient portal called "MyChart".  Sign up information is provided on this After Visit Summary.  MyChart is used to connect with patients for Virtual Visits (Telemedicine).  Patients are able to view lab/test results, encounter notes, upcoming appointments, etc.  Non-urgent messages can be sent to your provider as well.   °To learn more about what you can do with MyChart, go to [email or web address].   ° °Your next appointment:   °6 month(s) ° °The format for your next appointment:   °In Person ° °Provider:   °Gayatri Acharya, MD °

## 2020-10-02 DIAGNOSIS — H532 Diplopia: Secondary | ICD-10-CM | POA: Diagnosis not present

## 2020-10-05 DIAGNOSIS — Z0000 Encounter for general adult medical examination without abnormal findings: Secondary | ICD-10-CM | POA: Diagnosis not present

## 2020-10-05 DIAGNOSIS — L304 Erythema intertrigo: Secondary | ICD-10-CM | POA: Diagnosis not present

## 2020-10-05 DIAGNOSIS — Z7189 Other specified counseling: Secondary | ICD-10-CM | POA: Diagnosis not present

## 2020-10-05 DIAGNOSIS — M85859 Other specified disorders of bone density and structure, unspecified thigh: Secondary | ICD-10-CM | POA: Diagnosis not present

## 2020-10-05 DIAGNOSIS — E7800 Pure hypercholesterolemia, unspecified: Secondary | ICD-10-CM | POA: Diagnosis not present

## 2020-10-05 DIAGNOSIS — B0229 Other postherpetic nervous system involvement: Secondary | ICD-10-CM | POA: Diagnosis not present

## 2020-10-05 DIAGNOSIS — F3341 Major depressive disorder, recurrent, in partial remission: Secondary | ICD-10-CM | POA: Diagnosis not present

## 2020-10-05 DIAGNOSIS — I10 Essential (primary) hypertension: Secondary | ICD-10-CM | POA: Diagnosis not present

## 2020-10-05 DIAGNOSIS — R21 Rash and other nonspecific skin eruption: Secondary | ICD-10-CM | POA: Diagnosis not present

## 2020-10-05 DIAGNOSIS — Z1389 Encounter for screening for other disorder: Secondary | ICD-10-CM | POA: Diagnosis not present

## 2020-11-27 DIAGNOSIS — H532 Diplopia: Secondary | ICD-10-CM | POA: Diagnosis not present

## 2021-01-22 DIAGNOSIS — H532 Diplopia: Secondary | ICD-10-CM | POA: Diagnosis not present

## 2021-02-05 DIAGNOSIS — M25562 Pain in left knee: Secondary | ICD-10-CM | POA: Diagnosis not present

## 2021-02-05 DIAGNOSIS — M25512 Pain in left shoulder: Secondary | ICD-10-CM | POA: Diagnosis not present

## 2021-02-05 DIAGNOSIS — M25511 Pain in right shoulder: Secondary | ICD-10-CM | POA: Diagnosis not present

## 2021-03-16 ENCOUNTER — Emergency Department (HOSPITAL_COMMUNITY): Payer: PPO

## 2021-03-16 ENCOUNTER — Encounter (HOSPITAL_COMMUNITY): Payer: Self-pay

## 2021-03-16 ENCOUNTER — Other Ambulatory Visit: Payer: Self-pay

## 2021-03-16 ENCOUNTER — Inpatient Hospital Stay (HOSPITAL_COMMUNITY)
Admission: EM | Admit: 2021-03-16 | Discharge: 2021-03-26 | DRG: 563 | Disposition: A | Discharge status: Skilled Nursing Facility | Payer: PPO | Attending: Internal Medicine | Admitting: Internal Medicine

## 2021-03-16 DIAGNOSIS — Z634 Disappearance and death of family member: Secondary | ICD-10-CM

## 2021-03-16 DIAGNOSIS — R0902 Hypoxemia: Secondary | ICD-10-CM | POA: Diagnosis not present

## 2021-03-16 DIAGNOSIS — Z20822 Contact with and (suspected) exposure to covid-19: Secondary | ICD-10-CM | POA: Diagnosis present

## 2021-03-16 DIAGNOSIS — S32501A Unspecified fracture of right pubis, initial encounter for closed fracture: Secondary | ICD-10-CM | POA: Diagnosis not present

## 2021-03-16 DIAGNOSIS — D171 Benign lipomatous neoplasm of skin and subcutaneous tissue of trunk: Secondary | ICD-10-CM | POA: Diagnosis not present

## 2021-03-16 DIAGNOSIS — D72819 Decreased white blood cell count, unspecified: Secondary | ICD-10-CM | POA: Diagnosis not present

## 2021-03-16 DIAGNOSIS — D72829 Elevated white blood cell count, unspecified: Secondary | ICD-10-CM | POA: Diagnosis present

## 2021-03-16 DIAGNOSIS — W1830XA Fall on same level, unspecified, initial encounter: Secondary | ICD-10-CM | POA: Diagnosis present

## 2021-03-16 DIAGNOSIS — S42201A Unspecified fracture of upper end of right humerus, initial encounter for closed fracture: Secondary | ICD-10-CM | POA: Diagnosis present

## 2021-03-16 DIAGNOSIS — Z66 Do not resuscitate: Secondary | ICD-10-CM | POA: Diagnosis not present

## 2021-03-16 DIAGNOSIS — I5022 Chronic systolic (congestive) heart failure: Secondary | ICD-10-CM | POA: Diagnosis not present

## 2021-03-16 DIAGNOSIS — Z6830 Body mass index (BMI) 30.0-30.9, adult: Secondary | ICD-10-CM

## 2021-03-16 DIAGNOSIS — E785 Hyperlipidemia, unspecified: Secondary | ICD-10-CM | POA: Diagnosis not present

## 2021-03-16 DIAGNOSIS — R739 Hyperglycemia, unspecified: Secondary | ICD-10-CM | POA: Diagnosis not present

## 2021-03-16 DIAGNOSIS — M25572 Pain in left ankle and joints of left foot: Secondary | ICD-10-CM | POA: Diagnosis not present

## 2021-03-16 DIAGNOSIS — X58XXXA Exposure to other specified factors, initial encounter: Secondary | ICD-10-CM | POA: Diagnosis not present

## 2021-03-16 DIAGNOSIS — R2681 Unsteadiness on feet: Secondary | ICD-10-CM | POA: Diagnosis not present

## 2021-03-16 DIAGNOSIS — S301XXA Contusion of abdominal wall, initial encounter: Secondary | ICD-10-CM | POA: Diagnosis not present

## 2021-03-16 DIAGNOSIS — M25551 Pain in right hip: Secondary | ICD-10-CM | POA: Diagnosis not present

## 2021-03-16 DIAGNOSIS — M7989 Other specified soft tissue disorders: Secondary | ICD-10-CM | POA: Diagnosis not present

## 2021-03-16 DIAGNOSIS — E669 Obesity, unspecified: Secondary | ICD-10-CM | POA: Diagnosis present

## 2021-03-16 DIAGNOSIS — R609 Edema, unspecified: Secondary | ICD-10-CM | POA: Diagnosis not present

## 2021-03-16 DIAGNOSIS — S42201D Unspecified fracture of upper end of right humerus, subsequent encounter for fracture with routine healing: Secondary | ICD-10-CM | POA: Diagnosis not present

## 2021-03-16 DIAGNOSIS — I10 Essential (primary) hypertension: Secondary | ICD-10-CM | POA: Diagnosis not present

## 2021-03-16 DIAGNOSIS — Z79899 Other long term (current) drug therapy: Secondary | ICD-10-CM

## 2021-03-16 DIAGNOSIS — S199XXA Unspecified injury of neck, initial encounter: Secondary | ICD-10-CM | POA: Diagnosis not present

## 2021-03-16 DIAGNOSIS — M79603 Pain in arm, unspecified: Secondary | ICD-10-CM | POA: Diagnosis not present

## 2021-03-16 DIAGNOSIS — I5042 Chronic combined systolic (congestive) and diastolic (congestive) heart failure: Secondary | ICD-10-CM | POA: Diagnosis present

## 2021-03-16 DIAGNOSIS — R5381 Other malaise: Secondary | ICD-10-CM | POA: Diagnosis not present

## 2021-03-16 DIAGNOSIS — F32A Depression, unspecified: Secondary | ICD-10-CM | POA: Diagnosis not present

## 2021-03-16 DIAGNOSIS — S300XXA Contusion of lower back and pelvis, initial encounter: Secondary | ICD-10-CM | POA: Diagnosis present

## 2021-03-16 DIAGNOSIS — B0229 Other postherpetic nervous system involvement: Secondary | ICD-10-CM | POA: Diagnosis not present

## 2021-03-16 DIAGNOSIS — S3289XA Fracture of other parts of pelvis, initial encounter for closed fracture: Secondary | ICD-10-CM | POA: Diagnosis not present

## 2021-03-16 DIAGNOSIS — R2689 Other abnormalities of gait and mobility: Secondary | ICD-10-CM | POA: Diagnosis not present

## 2021-03-16 DIAGNOSIS — S3282XA Multiple fractures of pelvis without disruption of pelvic ring, initial encounter for closed fracture: Secondary | ICD-10-CM | POA: Diagnosis present

## 2021-03-16 DIAGNOSIS — Z9049 Acquired absence of other specified parts of digestive tract: Secondary | ICD-10-CM

## 2021-03-16 DIAGNOSIS — E538 Deficiency of other specified B group vitamins: Secondary | ICD-10-CM | POA: Diagnosis not present

## 2021-03-16 DIAGNOSIS — S42291A Other displaced fracture of upper end of right humerus, initial encounter for closed fracture: Secondary | ICD-10-CM | POA: Diagnosis not present

## 2021-03-16 DIAGNOSIS — S0990XA Unspecified injury of head, initial encounter: Secondary | ICD-10-CM | POA: Diagnosis not present

## 2021-03-16 DIAGNOSIS — I447 Left bundle-branch block, unspecified: Secondary | ICD-10-CM | POA: Diagnosis present

## 2021-03-16 DIAGNOSIS — Z7982 Long term (current) use of aspirin: Secondary | ICD-10-CM

## 2021-03-16 DIAGNOSIS — D649 Anemia, unspecified: Secondary | ICD-10-CM | POA: Diagnosis present

## 2021-03-16 DIAGNOSIS — S3722XA Contusion of bladder, initial encounter: Secondary | ICD-10-CM | POA: Diagnosis not present

## 2021-03-16 DIAGNOSIS — G609 Hereditary and idiopathic neuropathy, unspecified: Secondary | ICD-10-CM | POA: Diagnosis not present

## 2021-03-16 DIAGNOSIS — Z888 Allergy status to other drugs, medicaments and biological substances status: Secondary | ICD-10-CM

## 2021-03-16 DIAGNOSIS — Z8249 Family history of ischemic heart disease and other diseases of the circulatory system: Secondary | ICD-10-CM

## 2021-03-16 DIAGNOSIS — Z91048 Other nonmedicinal substance allergy status: Secondary | ICD-10-CM

## 2021-03-16 DIAGNOSIS — M1990 Unspecified osteoarthritis, unspecified site: Secondary | ICD-10-CM | POA: Diagnosis present

## 2021-03-16 DIAGNOSIS — T1490XA Injury, unspecified, initial encounter: Secondary | ICD-10-CM

## 2021-03-16 DIAGNOSIS — I672 Cerebral atherosclerosis: Secondary | ICD-10-CM | POA: Diagnosis not present

## 2021-03-16 DIAGNOSIS — S42301A Unspecified fracture of shaft of humerus, right arm, initial encounter for closed fracture: Secondary | ICD-10-CM | POA: Diagnosis not present

## 2021-03-16 DIAGNOSIS — Z9889 Other specified postprocedural states: Secondary | ICD-10-CM | POA: Diagnosis not present

## 2021-03-16 DIAGNOSIS — I110 Hypertensive heart disease with heart failure: Secondary | ICD-10-CM | POA: Diagnosis not present

## 2021-03-16 DIAGNOSIS — M6281 Muscle weakness (generalized): Secondary | ICD-10-CM | POA: Diagnosis not present

## 2021-03-16 DIAGNOSIS — J9811 Atelectasis: Secondary | ICD-10-CM | POA: Diagnosis not present

## 2021-03-16 DIAGNOSIS — I959 Hypotension, unspecified: Secondary | ICD-10-CM | POA: Diagnosis not present

## 2021-03-16 DIAGNOSIS — Z7983 Long term (current) use of bisphosphonates: Secondary | ICD-10-CM

## 2021-03-16 DIAGNOSIS — S32110A Nondisplaced Zone I fracture of sacrum, initial encounter for closed fracture: Secondary | ICD-10-CM | POA: Diagnosis present

## 2021-03-16 DIAGNOSIS — M25511 Pain in right shoulder: Secondary | ICD-10-CM | POA: Diagnosis not present

## 2021-03-16 DIAGNOSIS — Z79891 Long term (current) use of opiate analgesic: Secondary | ICD-10-CM

## 2021-03-16 DIAGNOSIS — S42351A Displaced comminuted fracture of shaft of humerus, right arm, initial encounter for closed fracture: Secondary | ICD-10-CM | POA: Diagnosis not present

## 2021-03-16 DIAGNOSIS — S3210XA Unspecified fracture of sacrum, initial encounter for closed fracture: Secondary | ICD-10-CM | POA: Diagnosis not present

## 2021-03-16 DIAGNOSIS — Z8616 Personal history of COVID-19: Secondary | ICD-10-CM | POA: Diagnosis not present

## 2021-03-16 DIAGNOSIS — S42211A Unspecified displaced fracture of surgical neck of right humerus, initial encounter for closed fracture: Secondary | ICD-10-CM | POA: Diagnosis not present

## 2021-03-16 DIAGNOSIS — M1611 Unilateral primary osteoarthritis, right hip: Secondary | ICD-10-CM | POA: Diagnosis not present

## 2021-03-16 DIAGNOSIS — M25562 Pain in left knee: Secondary | ICD-10-CM | POA: Diagnosis not present

## 2021-03-16 DIAGNOSIS — E871 Hypo-osmolality and hyponatremia: Secondary | ICD-10-CM | POA: Diagnosis present

## 2021-03-16 DIAGNOSIS — Z90710 Acquired absence of both cervix and uterus: Secondary | ICD-10-CM

## 2021-03-16 DIAGNOSIS — S32511A Fracture of superior rim of right pubis, initial encounter for closed fracture: Secondary | ICD-10-CM | POA: Diagnosis not present

## 2021-03-16 DIAGNOSIS — Z96653 Presence of artificial knee joint, bilateral: Secondary | ICD-10-CM | POA: Diagnosis present

## 2021-03-16 DIAGNOSIS — M47816 Spondylosis without myelopathy or radiculopathy, lumbar region: Secondary | ICD-10-CM | POA: Diagnosis not present

## 2021-03-16 DIAGNOSIS — Z885 Allergy status to narcotic agent status: Secondary | ICD-10-CM

## 2021-03-16 DIAGNOSIS — M19012 Primary osteoarthritis, left shoulder: Secondary | ICD-10-CM | POA: Diagnosis not present

## 2021-03-16 DIAGNOSIS — Z743 Need for continuous supervision: Secondary | ICD-10-CM | POA: Diagnosis not present

## 2021-03-16 DIAGNOSIS — W19XXXA Unspecified fall, initial encounter: Secondary | ICD-10-CM | POA: Diagnosis not present

## 2021-03-16 DIAGNOSIS — N319 Neuromuscular dysfunction of bladder, unspecified: Secondary | ICD-10-CM | POA: Diagnosis present

## 2021-03-16 DIAGNOSIS — R262 Difficulty in walking, not elsewhere classified: Secondary | ICD-10-CM | POA: Diagnosis not present

## 2021-03-16 LAB — CBC WITH DIFFERENTIAL/PLATELET
Abs Immature Granulocytes: 0.18 10*3/uL — ABNORMAL HIGH (ref 0.00–0.07)
Basophils Absolute: 0 10*3/uL (ref 0.0–0.1)
Basophils Relative: 0 %
Eosinophils Absolute: 0.1 10*3/uL (ref 0.0–0.5)
Eosinophils Relative: 1 %
HCT: 39.7 % (ref 36.0–46.0)
Hemoglobin: 13.2 g/dL (ref 12.0–15.0)
Immature Granulocytes: 1 %
Lymphocytes Relative: 11 %
Lymphs Abs: 1.6 10*3/uL (ref 0.7–4.0)
MCH: 32 pg (ref 26.0–34.0)
MCHC: 33.2 g/dL (ref 30.0–36.0)
MCV: 96.1 fL (ref 80.0–100.0)
Monocytes Absolute: 1.1 10*3/uL — ABNORMAL HIGH (ref 0.1–1.0)
Monocytes Relative: 8 %
Neutro Abs: 11.4 10*3/uL — ABNORMAL HIGH (ref 1.7–7.7)
Neutrophils Relative %: 79 %
Platelets: 221 10*3/uL (ref 150–400)
RBC: 4.13 MIL/uL (ref 3.87–5.11)
RDW: 12.7 % (ref 11.5–15.5)
WBC: 14.3 10*3/uL — ABNORMAL HIGH (ref 4.0–10.5)
nRBC: 0 % (ref 0.0–0.2)

## 2021-03-16 LAB — BASIC METABOLIC PANEL
Anion gap: 6 (ref 5–15)
BUN: 15 mg/dL (ref 8–23)
CO2: 30 mmol/L (ref 22–32)
Calcium: 9 mg/dL (ref 8.9–10.3)
Chloride: 97 mmol/L — ABNORMAL LOW (ref 98–111)
Creatinine, Ser: 0.57 mg/dL (ref 0.44–1.00)
GFR, Estimated: >60 mL/min (ref >60)
Glucose, Bld: 115 mg/dL — ABNORMAL HIGH (ref 70–99)
Potassium: 3.6 mmol/L (ref 3.5–5.1)
Sodium: 133 mmol/L — ABNORMAL LOW (ref 135–145)

## 2021-03-16 MED ORDER — FENTANYL CITRATE (PF) 100 MCG/2ML IJ SOLN
50.0000 ug | Freq: Once | INTRAMUSCULAR | Status: AC
Start: 2021-03-16 — End: 2021-03-17
  Administered 2021-03-17: 50 ug via INTRAVENOUS
  Filled 2021-03-16: qty 2

## 2021-03-16 MED ORDER — FENTANYL CITRATE (PF) 100 MCG/2ML IJ SOLN
50.0000 ug | Freq: Once | INTRAMUSCULAR | Status: AC
  Administered 2021-03-16: 22:00:00 50 ug via INTRAVENOUS
  Filled 2021-03-16: qty 2

## 2021-03-16 NOTE — ED Provider Notes (Signed)
[facility] EMERGENCY DEPARTMENT Provider Note   CSN: [id number] Arrival date & time: 03/16/21  2149     History Chief Complaint  Patient presents with   Hip Injury    Terri Liu is a 87 y.o. female with past medical history of hypertension that presents to the emerge department today for fall.  Patient states that she went to her doctor's appointment earlier to have a cyst drained in her left knee, states that she was standing and her left knee gave out on her which caused her to fall on her right side.  Was a mechanical fall.  Patient is on a blood thinner.  Patient did not hit her head.  Patient states that she primarily fell in the right side, complaining of right hip pain and right upper extremity pain.  Patient denies any numbness or tingling.  States that she did break her left hip and does not feel similar.  Patient states that she was unable to get up from the floor, was on the floor for about 10 minutes.  No other injuries or complaints at this time.  No lacerations.  Was in her normal health before this.  HPI     Past Medical History:  Diagnosis Date   Arthritis    Diarrhea    "sometimes ., due to Gallbladder removed"   High cholesterol    History of shingles    effecrt nerve sees neurologist in [facility]   Hypertension    Post herpetic neuralgia 08/15/2016   Right V1   Shingles     Patient Active Problem List   Diagnosis Date Noted   Closed fracture of proximal end of right humerus, unspecified fracture morphology, initial encounter 03/16/2021   Pneumonia due to COVID-19 virus 09/12/2020   COVID-19 virus infection 09/11/2020   Generalized weakness 09/11/2020   COVID-19 09/11/2020   Generalized weakness 09/11/2020   Closed comminuted intertrochanteric fracture of left femur ([place]) 01/12/2017   Closed intertrochanteric fracture, left, initial encounter ([place]) 01/11/2017   Post herpetic neuralgia 08/15/2016   Hereditary and idiopathic  peripheral neuropathy 08/15/2016   Primary osteoarthritis of left knee 12/17/2015   Left knee DJD 12/17/2015   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   Preretinal fibrosis, right eye 01/27/2015   Epiretinal membrane 01/27/2015   HYPERLIPIDEMIA 11/09/2006   OBESITY, NOS 11/09/2006   DEPRESSION, MAJOR, RECURRENT 11/09/2006   NEUROGENIC BLADDER 11/09/2006   HYPERTENSION, BENIGN SYSTEMIC 11/09/2006   ARTHRITIS 11/09/2006    Past Surgical History:  Procedure Laterality Date   ABDOMINAL HYSTERECTOMY     COMPLETE   CARPAL TUNNEL RELEASE Bilateral    CHOLECYSTECTOMY     EYE SURGERY     Cornea   FEMUR IM NAIL Left 01/12/2017   Procedure: INTRAMEDULLARY (IM) NAIL FEMORAL LEFT;  Surgeon: Rod Can, MD;  Location: [facility];  Service: Orthopedics;  Laterality: Left;   GAS INSERTION Right 01/27/2015   Procedure: INSERTION OF GAS;  Surgeon: Hayden Pedro, MD;  Location: [place];  Service: Ophthalmology;  Laterality: Right;  [id number]   JOINT REPLACEMENT Right 4 YRS AGO   KNEE   KNEE ARTHROSCOPY Bilateral    MEMBRANE PEEL Right 01/27/2015   Procedure: MEMBRANE PEEL;  Surgeon: Hayden Pedro, MD;  Location: [place];  Service: Ophthalmology;  Laterality: Right;   PERFLUORONE INJECTION Right 01/27/2015   Procedure: PERFLUORONE INJECTION;  Surgeon: Hayden Pedro, MD;  Location: [place];  Service: Ophthalmology;  Laterality: Right;  PHOTOCOAGULATION WITH LASER Right 01/27/2015   Procedure: PHOTOCOAGULATION WITH LASER;  Surgeon: Hayden Pedro, MD;  Location: [place];  Service: Ophthalmology;  Laterality: Right;  ENDOLASER   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   Procedure: REPAIR OF COMPLEX TRACTION RETINAL DETACHMENT;  Surgeon: Hayden Pedro, MD;  Location: [place];  Service: Ophthalmology;  Laterality: Right;   TOTAL KNEE ARTHROPLASTY Left 12/17/2015   Procedure: LEFT TOTAL KNEE ARTHROPLASTY;  Surgeon: Susa Day, MD;  Location: [facility];  Service: Orthopedics;   Laterality: Left;   VITRECTOMY 25 GAUGE WITH SCLERAL BUCKLE Right 01/27/2015   Procedure: VITRECTOMY 25 GAUGE WITH SCLERAL BUCKLE;  Surgeon: Hayden Pedro, MD;  Location: [place];  Service: Ophthalmology;  Laterality: Right;     OB History   No obstetric history on file.     Family History  Problem Relation Age of Onset   Heart failure Mother    Cancer - Prostate Father    Heart disease Brother    Cancer Sister    Cancer Brother     Social History   Tobacco Use   Smoking status: Never   Smokeless tobacco: Never  Substance Use Topics   Alcohol use: No   Drug use: No    Home Medications Prior to Admission medications   Medication Sig Start Date End Date Taking? Authorizing Provider  acetaminophen (TYLENOL) 500 MG tablet Take 1,000 mg by mouth every 8 (eight) hours as needed for mild pain.    [provider]  alendronate (FOSAMAX) 70 MG tablet Take 70 mg by mouth once a week.    [provider]  ascorbic acid (VITAMIN C) 500 MG tablet Take 500 mg by mouth daily.    [provider]  aspirin 81 MG EC tablet Take 1 tablet (81 mg total) by mouth daily. 09/14/20   Cherene Altes, MD  BIOTIN FORTE PO Take 1 tablet by mouth daily at 12 noon.    [provider]  Carboxymethylcellulose Sodium (ARTIFICIAL TEARS OP) Place 1 drop into both eyes daily as needed (dry eyes).    [provider]  cholecalciferol (VITAMIN D) 1000 units tablet Take 1,000 Units by mouth daily.    [provider]  escitalopram (LEXAPRO) 10 MG tablet Take 10 mg by mouth daily as needed (mood). 07/28/20   [provider]  fluticasone (FLONASE) 50 MCG/ACT nasal spray Place 1 spray into both nostrils daily.    [provider]  losartan (COZAAR) 50 MG tablet Take 50 mg by mouth daily. 07/27/20   [provider]  lovastatin (MEVACOR) 40 MG tablet Take 40 mg by mouth daily. 07/29/16   [provider]  metoprolol tartrate  (LOPRESSOR) 25 MG tablet Take 25 mg by mouth 2 (two) times daily. 05/23/16   [provider]  Misc Natural Products (TART CHERRY ADVANCED PO) Take 1 tablet by mouth daily.    [provider]  OXcarbazepine (TRILEPTAL) 150 MG tablet Take 1 tablet (150 mg total) by mouth 2 (two) times daily. 05/23/18   Dennie Bible, NP  predniSONE (DELTASONE) 20 MG tablet Take 2 tablets a day on 1/4 and 1/5 and 1/6 THEN 1 tablet a day on 1/7 and 1/8 and 1/9 THEN half a tablet a day on 1/10 and 1/11 THEN STOP 09/14/20   Cherene Altes, MD  Prenatal Vit-Fe Fumarate-FA (PRENATAL MULTIVITAMIN) TABS Take 1 tablet by mouth every morning.     [provider]  traMADol (  ULTRAM) 50 MG tablet Take 50 mg by mouth 2 (two) times daily as needed for moderate pain. 04/13/20   [provider]  vitamin B-12 (CYANOCOBALAMIN) 100 MCG tablet Take 100 mcg by mouth daily.    [provider]    Allergies    Amlodipine besylate-valsartan, Cymbalta [duloxetine hcl], Hydrocodone-acetaminophen, Lipitor [atorvastatin], Other, Tape, and Codeine  Review of Systems   Review of Systems  Constitutional:  Negative for chills, diaphoresis, fatigue and fever.  HENT:  Negative for congestion, sore throat and trouble swallowing.   Eyes:  Negative for pain and visual disturbance.  Respiratory:  Negative for cough, shortness of breath and wheezing.   Cardiovascular:  Negative for chest pain, palpitations and leg swelling.  Gastrointestinal:  Negative for abdominal distention, abdominal pain, diarrhea, nausea and vomiting.  Genitourinary:  Negative for difficulty urinating.  Musculoskeletal:  Positive for arthralgias. Negative for back pain, neck pain and neck stiffness.  Skin:  Negative for pallor.  Neurological:  Negative for dizziness, speech difficulty, weakness and headaches.  Psychiatric/Behavioral:  Negative for confusion.    Physical Exam Updated Vital Signs BP (!) 120/47   Pulse 69    Temp 99.2 F (37.3 C) (Oral)   Resp 20   Ht 5\' 4"  (1.626 m)   Wt 80.7 kg   SpO2 92%   BMI 30.55 kg/m   Physical Exam Constitutional:      General: She is in acute distress.     Appearance: Normal appearance. She is not ill-appearing, toxic-appearing or diaphoretic.  HENT:     Head: Normocephalic and atraumatic.     Comments: Head atraumatic without any lacerations or hematomas noted.    Mouth/Throat:     Mouth: Mucous membranes are moist.     Pharynx: Oropharynx is clear.  Eyes:     General: No scleral icterus.    Extraocular Movements: Extraocular movements intact.     Pupils: Pupils are equal, round, and reactive to light.  Cardiovascular:     Rate and Rhythm: Normal rate and regular rhythm.     Pulses: Normal pulses.     Heart sounds: Normal heart sounds.  Pulmonary:     Effort: Pulmonary effort is normal. No respiratory distress.     Breath sounds: Normal breath sounds. No stridor. No wheezing, rhonchi or rales.  Chest:     Chest wall: No tenderness.  Abdominal:     General: Abdomen is flat. There is no distension.     Palpations: Abdomen is soft.     Tenderness: There is no abdominal tenderness. There is no guarding or rebound.  Musculoskeletal:        General: No swelling or tenderness. Normal range of motion.     Cervical back: Normal range of motion and neck supple. No rigidity.     Right lower leg: No edema.     Left lower leg: No edema.     Comments: Right upper extremity with tenderness to right humerus.  No obvious deformity noted.  No tenderness to palpation of shoulder elbow or wrist.  Normal strength of wrist and elbow, however unable to move shoulder due to humerus pain.  Radial pulse 2+.  Good strength of fingers.  Right lower extremity with tenderness to right hip.  Patient's leg is not shortened or internally or externally rotated.  Patient is able to lift leg in the air, however does cause pain.  No tenderness to movement or palpation of knee or ankle.   DP pulse 2+.  Left side normal. No cervical, thoracic or lumbar tenderness.  Skin:    General: Skin is warm and dry.     Capillary Refill: Capillary refill takes less than 2 seconds.     Coloration: Skin is not pale.  Neurological:     General: No focal deficit present.     Mental Status: She is alert and oriented to person, place, and time.  Psychiatric:        Mood and Affect: Mood normal.        Behavior: Behavior normal.    ED Results / Procedures / Treatments   Labs (all labs ordered are listed, but only abnormal results are displayed) Labs Reviewed  BASIC METABOLIC PANEL - Abnormal; Notable for the following components:      Result Value   Sodium 133 (*)    Chloride 97 (*)    Glucose, Bld 115 (*)    All other components within normal limits  CBC WITH DIFFERENTIAL/PLATELET - Abnormal; Notable for the following components:   WBC 14.3 (*)    Neutro Abs 11.4 (*)    Monocytes Absolute 1.1 (*)    Abs Immature Granulocytes 0.18 (*)    All other components within normal limits  RESP PANEL BY RT-PCR (FLU A&B, COVID) [id number]    EKG None  Radiology DG Shoulder Right  Result Date: 03/16/2021 CLINICAL DATA:  Pain and limited range of motion after a fall. EXAM: RIGHT SHOULDER - 2+ VIEW COMPARISON:  03/05/2018 FINDINGS: Acute comminuted fractures involving the right humeral head and neck. Fracture lines extend across the greater tuberosity with mild displacement of the tuberosity fragment. Fracture fragments are impacted. No glenohumeral dislocation. Degenerative changes in the glenohumeral and acromioclavicular joints. Increase of the subacromial space likely represents an effusion. IMPRESSION: Acute comminuted fractures of the right humeral head and neck. Electronically Signed   By: Lucienne Capers M.D.   On: 03/16/2021 22:59   DG Chest Portable 1 View  Result Date: 03/16/2021 CLINICAL DATA:  Trauma.  Mechanical fall. EXAM: PORTABLE CHEST 1 VIEW COMPARISON:  Chest x-ray  09/13/2020. FINDINGS: The heart size and mediastinal contours are unchanged. Aortic calcification. Similar-appearing elevated right hemidiaphragm. Right base atelectasis. No focal consolidation. No pulmonary edema. No pleural effusion. No pneumothorax. Comminuted right humeral fracture new from 09/13/2020. IMPRESSION: 1. Comminuted right humeral fracture new from 09/13/2020. please see separately dictated dedicated right shoulder radiographs 03/16/2021. 2. No acute cardiopulmonary abnormality. Electronically Signed   By: Iven Finn M.D.   On: 03/16/2021 23:30   DG Humerus Right  Result Date: 03/16/2021 CLINICAL DATA:  Pain and limited range of motion after a fall. EXAM: RIGHT HUMERUS - 2+ VIEW COMPARISON:  03/05/2018 FINDINGS: Acute comminuted fractures of the right humeral head and neck with superior displacement and impaction of the distal fracture fragments resulting in varus angulation. The midshaft and distal right humerus are intact. Diffuse soft tissue swelling. IMPRESSION: Acute comminuted fractures of the right humeral head and neck with varus angulation. Electronically Signed   By: Lucienne Capers M.D.   On: 03/16/2021 22:58   DG Hip Unilat With Pelvis 2-3 Views Right  Result Date: 03/16/2021 CLINICAL DATA:  Pain and limited range of motion after a fall EXAM: DG HIP (WITH OR WITHOUT PELVIS) 2-3V RIGHT COMPARISON:  None. FINDINGS: Degenerative changes in the lower lumbar spine and hips. Postoperative internal fixation of the left proximal femur. Acute appearing displaced fractures of the right superior and inferior pubic rami. No right hip fracture identified. SI joints  and symphysis pubis are not displaced. IMPRESSION: Acute displaced fractures of the right superior and inferior pubic rami. Degenerative changes in the lower lumbar spine and hips. Electronically Signed   By: Lucienne Capers M.D.   On: 03/16/2021 22:57    Procedures Procedures   Medications Ordered in ED Medications   fentaNYL (SUBLIMAZE) injection 50 mcg (has no administration in time range)  fentaNYL (SUBLIMAZE) injection 50 mcg (50 mcg Intravenous Given 03/16/21 2221)    ED Course  I have reviewed the triage vital signs and the nursing notes.  Pertinent labs & imaging results that were available during my care of the patient were reviewed by me and considered in my medical decision making (see chart for details).    MDM Rules/Calculators/A&P                         Patient presents to the emerge department today for mechanical fall.  No obvious trauma noted.  Right side affected, will obtain imaging of right upper extremity and right hip.  Patient is distally neurovascularly intact.  Unable to ambulate.  Orthopedic surgery and trauma will consult, wants medical admission. Sling ordered for R shoulder.   Spoke to Dr. Myna Hidalgo who will admit patient.  The patient appears reasonably stabilized for admission considering the current resources, flow, and capabilities available in the ED at this time, and I doubt any other [facility] requiring further screening and/or treatment in the ED prior to admission.  I discussed this case with my attending physician who cosigned this note including patient's presenting symptoms, physical exam, and planned diagnostics and interventions. Attending physician stated agreement with plan or made changes to plan which were implemented.   Attending physician assessed patient at bedside.    Final Clinical Impression(s) / ED Diagnoses Final diagnoses:  Humeral head fracture, right, closed, initial encounter  Multiple closed fractures of pelvis without disruption of pelvic ring, initial encounter [facility])    Rx / [place] Orders ED Discharge Orders     None        Alfredia Client, PA-C 03/16/21 2352    Lacretia Leigh, MD 03/17/21 1551

## 2021-03-16 NOTE — ED Triage Notes (Signed)
Pt coming from home after mechanical fall. Pt reported being at doctors appointment earlier to have a cyst drained in LT knee; same knee gave out at home which caused the fall. Pt fell on RT side and reports RT hip pain, and RUE pain. Arrived in sling.

## 2021-03-16 NOTE — ED Provider Notes (Signed)
I provided a substantive portion of the care of this patient.  I personally performed the entirety of the medical decision making for this encounter.     87 year old female presents had mechanical fall prior to arrival.  Complains of pain to her right hip as well as her right upper extremity.  Will x-ray.  Patient is neurovascular intact.   Lacretia Leigh, MD 03/16/21 2230

## 2021-03-17 ENCOUNTER — Encounter (HOSPITAL_COMMUNITY): Payer: Self-pay | Admitting: Family Medicine

## 2021-03-17 ENCOUNTER — Inpatient Hospital Stay (HOSPITAL_COMMUNITY): Payer: PPO

## 2021-03-17 DIAGNOSIS — S42291A Other displaced fracture of upper end of right humerus, initial encounter for closed fracture: Secondary | ICD-10-CM | POA: Insufficient documentation

## 2021-03-17 DIAGNOSIS — D72819 Decreased white blood cell count, unspecified: Secondary | ICD-10-CM | POA: Insufficient documentation

## 2021-03-17 DIAGNOSIS — S3282XA Multiple fractures of pelvis without disruption of pelvic ring, initial encounter for closed fracture: Secondary | ICD-10-CM | POA: Diagnosis present

## 2021-03-17 DIAGNOSIS — F32A Depression, unspecified: Secondary | ICD-10-CM | POA: Diagnosis present

## 2021-03-17 DIAGNOSIS — I5022 Chronic systolic (congestive) heart failure: Secondary | ICD-10-CM | POA: Diagnosis present

## 2021-03-17 LAB — BASIC METABOLIC PANEL
Anion gap: 7 (ref 5–15)
BUN: 15 mg/dL (ref 8–23)
CO2: 29 mmol/L (ref 22–32)
Calcium: 9 mg/dL (ref 8.9–10.3)
Chloride: 99 mmol/L (ref 98–111)
Creatinine, Ser: 0.6 mg/dL (ref 0.44–1.00)
GFR, Estimated: >60 mL/min (ref >60)
Glucose, Bld: 165 mg/dL — ABNORMAL HIGH (ref 70–99)
Potassium: 4.4 mmol/L (ref 3.5–5.1)
Sodium: 135 mmol/L (ref 135–145)

## 2021-03-17 LAB — CBC
HCT: 35.2 % — ABNORMAL LOW (ref 36.0–46.0)
Hemoglobin: 11.7 g/dL — ABNORMAL LOW (ref 12.0–15.0)
MCH: 32.1 pg (ref 26.0–34.0)
MCHC: 33.2 g/dL (ref 30.0–36.0)
MCV: 96.4 fL (ref 80.0–100.0)
Platelets: 207 10*3/uL (ref 150–400)
RBC: 3.65 MIL/uL — ABNORMAL LOW (ref 3.87–5.11)
RDW: 12.9 % (ref 11.5–15.5)
WBC: 11.9 10*3/uL — ABNORMAL HIGH (ref 4.0–10.5)
nRBC: 0 % (ref 0.0–0.2)

## 2021-03-17 LAB — RESP PANEL BY RT-PCR (FLU A&B, COVID) ARPGX2
Influenza A by PCR: NEGATIVE
Influenza B by PCR: NEGATIVE
SARS Coronavirus 2 by RT PCR: NEGATIVE

## 2021-03-17 LAB — URINALYSIS, ROUTINE W REFLEX MICROSCOPIC
Bilirubin Urine: NEGATIVE
Glucose, UA: NEGATIVE mg/dL
Hgb urine dipstick: NEGATIVE
Ketones, ur: NEGATIVE mg/dL
Nitrite: NEGATIVE
Protein, ur: NEGATIVE mg/dL
Specific Gravity, Urine: >1.046 — ABNORMAL HIGH (ref 1.005–1.030)
pH: 5 (ref 5.0–8.0)

## 2021-03-17 MED ORDER — BISACODYL 5 MG PO TBEC: 5.0000 mg | DELAYED_RELEASE_TABLET | Freq: Every day | ORAL | Status: DC | PRN

## 2021-03-17 MED ORDER — LACTATED RINGERS IV SOLN: INTRAVENOUS | Status: DC

## 2021-03-17 MED ORDER — LOSARTAN POTASSIUM 50 MG PO TABS
50.0000 mg | ORAL_TABLET | Freq: Every day | ORAL | Status: DC
  Administered 2021-03-17: 50 mg via ORAL
  Filled 2021-03-17: qty 1

## 2021-03-17 MED ORDER — TRAMADOL HCL 50 MG PO TABS: 50.0000 mg | ORAL_TABLET | Freq: Four times a day (QID) | ORAL | Status: DC | PRN

## 2021-03-17 MED ORDER — VITAMIN D 25 MCG (1000 UNIT) PO TABS
1000.0000 [IU] | ORAL_TABLET | Freq: Every day | ORAL | Status: DC
  Administered 2021-03-17 – 2021-03-26 (×10): 1000 [IU] via ORAL
  Filled 2021-03-17 (×10): qty 1

## 2021-03-17 MED ORDER — ONDANSETRON HCL 4 MG PO TABS: 4.0000 mg | ORAL_TABLET | Freq: Four times a day (QID) | ORAL | Status: DC | PRN

## 2021-03-17 MED ORDER — MORPHINE SULFATE (PF) 2 MG/ML IV SOLN
2.0000 mg | INTRAVENOUS | Status: DC | PRN
  Administered 2021-03-17: 2 mg via INTRAVENOUS
  Filled 2021-03-17: qty 1

## 2021-03-17 MED ORDER — ACETAMINOPHEN 325 MG PO TABS: 650.0000 mg | ORAL_TABLET | Freq: Four times a day (QID) | ORAL | Status: DC | PRN

## 2021-03-17 MED ORDER — IOHEXOL 300 MG/ML  SOLN
100.0000 mL | Freq: Once | INTRAMUSCULAR | Status: AC | PRN
  Administered 2021-03-17: 100 mL via INTRAVENOUS

## 2021-03-17 MED ORDER — ENOXAPARIN SODIUM 30 MG/0.3ML IJ SOSY: 30.0000 mg | PREFILLED_SYRINGE | INTRAMUSCULAR | Status: DC

## 2021-03-17 MED ORDER — OXCARBAZEPINE 150 MG PO TABS
150.0000 mg | ORAL_TABLET | Freq: Two times a day (BID) | ORAL | Status: DC
  Administered 2021-03-17 – 2021-03-26 (×19): 150 mg via ORAL
  Filled 2021-03-17 (×20): qty 1

## 2021-03-17 MED ORDER — FENTANYL CITRATE (PF) 100 MCG/2ML IJ SOLN
25.0000 ug | INTRAMUSCULAR | Status: DC | PRN
  Administered 2021-03-17: 25 ug via INTRAVENOUS
  Filled 2021-03-17: qty 2

## 2021-03-17 MED ORDER — ACETAMINOPHEN 500 MG PO TABS: 1000.0000 mg | ORAL_TABLET | Freq: Three times a day (TID) | ORAL | Status: DC | PRN

## 2021-03-17 MED ORDER — SENNOSIDES-DOCUSATE SODIUM 8.6-50 MG PO TABS: 1.0000 | ORAL_TABLET | Freq: Every evening | ORAL | Status: DC | PRN

## 2021-03-17 MED ORDER — ONDANSETRON HCL 4 MG/2ML IJ SOLN
4.0000 mg | Freq: Four times a day (QID) | INTRAMUSCULAR | Status: DC | PRN
  Administered 2021-03-17 – 2021-03-20 (×3): 4 mg via INTRAVENOUS
  Filled 2021-03-17 (×3): qty 2

## 2021-03-17 MED ORDER — METOPROLOL TARTRATE 25 MG PO TABS
25.0000 mg | ORAL_TABLET | Freq: Two times a day (BID) | ORAL | Status: DC
  Administered 2021-03-18: 25 mg via ORAL
  Filled 2021-03-17 (×3): qty 1

## 2021-03-17 MED ORDER — ADULT MULTIVITAMIN W/MINERALS CH
1.0000 | ORAL_TABLET | Freq: Every day | ORAL | Status: DC
  Administered 2021-03-17 – 2021-03-26 (×10): 1 via ORAL
  Filled 2021-03-17 (×9): qty 1

## 2021-03-17 MED ORDER — PRENATAL MULTIVITAMIN CH
1.0000 | ORAL_TABLET | Freq: Every morning | ORAL | Status: DC
  Filled 2021-03-17: qty 1

## 2021-03-17 MED ORDER — VITAMIN B-12 100 MCG PO TABS
100.0000 ug | ORAL_TABLET | Freq: Every day | ORAL | Status: DC
  Administered 2021-03-17 – 2021-03-26 (×10): 100 ug via ORAL
  Filled 2021-03-17 (×10): qty 1

## 2021-03-17 MED ORDER — OXYCODONE-ACETAMINOPHEN 7.5-325 MG PO TABS
1.0000 | ORAL_TABLET | ORAL | Status: DC | PRN
  Administered 2021-03-17 – 2021-03-18 (×3): 1 via ORAL
  Filled 2021-03-17 (×3): qty 1

## 2021-03-17 MED ORDER — ACETAMINOPHEN 650 MG RE SUPP: 650.0000 mg | Freq: Four times a day (QID) | RECTAL | Status: DC | PRN

## 2021-03-17 MED ORDER — PRAVASTATIN SODIUM 40 MG PO TABS
40.0000 mg | ORAL_TABLET | Freq: Every day | ORAL | Status: DC
  Administered 2021-03-17 – 2021-03-25 (×9): 40 mg via ORAL
  Filled 2021-03-17 (×9): qty 1

## 2021-03-17 NOTE — ED Notes (Signed)
Attempted to give report, RN currently in report and requests to call back in 15 minutes.

## 2021-03-17 NOTE — Progress Notes (Signed)
PROGRESS NOTE   Terri Liu  SWF:[id number] DOB: 1933/10/01 DOA: 03/16/2021 PCP: Leeroy Cha, MD  Brief Narrative:  87 year old white female HTN, postherpetic neuralgia, HLD, left hip fracture 01/2017 COVID-pneumonia 09/14/2020 status post remdesivir, steroids HFpEF/HFrEF-Echo 40-45% 12/31/2019 + chronic LBBB  Status post Baker's cyst fluid drainage 03/16/2021-left knee subsequently gave out patient fell onto her right side-hit right face-severe pain right arm  In emergency room trauma work-up revealed fracture right humeral head and neck sacral ala and superior inferior pubic rami on the right  Orthopedics and trauma were consulted-patient deemed her nonoperative candidate  Hospital-Problem based course  Accidental fall Right humerus head fracture, sacral ala fracture are non-operable/ orthopedics Pain rated 6/10-first choice Percocet 7.5 every 4 as needed, second choice IV morphine 2 mg every 3 as needed severe pain Needs shoulder/pelvis x-rays 4 weeks Dr. Lucia Gaskins office In the outpatient setting requires DEXA scan--check one 25-hydroxy vitamin D and increase vitamin D dosing if low Hold aspirin until discharge Iliopsoas hematomas with bladder displacement SCDs for now per trauma Lovenox as per them SCDs at this time HFpEF/HFrEF EF 40%-compensated no acute component Resume metoprolol 25 twice daily, losartan 50 daily--adjust medications if continues to be hypotensive Leukocytosis on admission Likely 2/2 stress of fracture-monitor trends over the next day or so No antibiotics indicated at this time Normocytic anemia ?  B12 deficiency in addition-continue B12 supplements, continue prenatal multivitamins Depression Continue Trileptal 150 twice daily   DVT prophylaxis: Lovenox may be from 7/7 SCD at this time Code Status:  DNR confirmed at the bedside Family Communication: Daughter present at the bedside understands plan of care as per discussion by me Disposition:  Status  is: Inpatient  Remains inpatient appropriate because:Persistent severe electrolyte disturbances, Ongoing active pain requiring inpatient pain management, and IV treatments appropriate due to intensity of illness or inability to take PO  Dispo: The patient is from: Home              Anticipated d/c is to: SNF              Patient currently is not medically stable to d/c.   Difficult to place patient No       Consultants:  Trauma Dr. Jamas [facility] Dr. Lucia Gaskins  Procedures: Multiple CT scan  Antimicrobials: None at this time   Subjective: Pain is 6/10 at rest "Pain is above 20 when I move" Declines to use sling as she states it is uncomfortable No fever no chills-hungry-daughter at bedside  Objective: Vitals:   03/17/21 0700 03/17/21 0730 03/17/21 0800 03/17/21 0830  BP: (!) 127/48 (!) 94/46 (!) 95/55 (!) 105/47  Pulse: 69 66 70 64  Resp: 16 (!) 22 (!) 26 (!) 25  Temp:      TempSrc:      SpO2: 98% 94% 97% 97%  Weight:      Height:       No intake or output data in the 24 hours ending 03/17/21 0929 Filed Weights   03/16/21 2152  Weight: 80.7 kg    Examination:  EOMI NCAT about stated age no rales no rhonchi neck soft supple able to move head right arm seems to be in a position of comfort across chest she has difficulty moving it externally rotating the shoulder etc. S1-S2 murmur noted no rub no gallop No lower extremity edema She is unable to straight leg raise on the right side Abdomen is soft nontender  Data Reviewed: personally reviewed   CBC  Component Value Date/Time   WBC 11.9 (H) 03/17/2021 0435   RBC 3.65 (L) 03/17/2021 0435   HGB 11.7 (L) 03/17/2021 0435   HGB 13.5 08/15/2016 1253   HCT 35.2 (L) 03/17/2021 0435   HCT 39.6 08/15/2016 1253   PLT 207 03/17/2021 0435   PLT 309 08/15/2016 1253   MCV 96.4 03/17/2021 0435   MCV 94 08/15/2016 1253   MCH 32.1 03/17/2021 0435   MCHC 33.2 03/17/2021 0435   RDW 12.9 03/17/2021 0435   RDW 13.6  08/15/2016 1253   LYMPHSABS 1.6 03/16/2021 2203   LYMPHSABS 1.4 08/15/2016 1253   MONOABS 1.1 (H) 03/16/2021 2203   EOSABS 0.1 03/16/2021 2203   EOSABS 0.1 08/15/2016 1253   BASOSABS 0.0 03/16/2021 2203   BASOSABS 0.0 08/15/2016 1253   CMP Latest Ref Rng & Units 03/17/2021 03/16/2021 09/14/2020  Glucose 70 - 99 mg/dL 165(H) 115(H) 122(H)  BUN 8 - 23 mg/dL 15 15 9   Creatinine 0.44 - 1.00 mg/dL 0.60 0.57 0.56  Sodium 135 - 145 mmol/L 135 133(L) 136  Potassium 3.5 - 5.1 mmol/L 4.4 3.6 3.8  Chloride 98 - 111 mmol/L 99 97(L) 95(L)  CO2 22 - 32 mmol/L 29 30 30   Calcium 8.9 - 10.3 mg/dL 9.0 9.0 9.3  Total Protein 6.5 - 8.1 g/dL - - 6.2(L)  Total Bilirubin 0.3 - 1.2 mg/dL - - 0.5  Alkaline Phos 38 - 126 U/L - - 56  AST 15 - 41 U/L - - 23  ALT 0 - 44 U/L - - 18     Radiology Studies: DG Shoulder Right  Result Date: 03/16/2021 CLINICAL DATA:  Pain and limited range of motion after a fall. EXAM: RIGHT SHOULDER - 2+ VIEW COMPARISON:  03/05/2018 FINDINGS: Acute comminuted fractures involving the right humeral head and neck. Fracture lines extend across the greater tuberosity with mild displacement of the tuberosity fragment. Fracture fragments are impacted. No glenohumeral dislocation. Degenerative changes in the glenohumeral and acromioclavicular joints. Increase of the subacromial space likely represents an effusion. IMPRESSION: Acute comminuted fractures of the right humeral head and neck. Electronically Signed   By: Lucienne Capers M.D.   On: 03/16/2021 22:59   CT HEAD WO CONTRAST  Result Date: 03/17/2021 CLINICAL DATA:  Mechanical fall.  Head trauma. EXAM: CT HEAD WITHOUT CONTRAST CT CERVICAL SPINE WITHOUT CONTRAST TECHNIQUE: Multidetector CT imaging of the head and cervical spine was performed following the standard protocol without intravenous contrast. Multiplanar CT image reconstructions of the cervical spine were also generated. COMPARISON:  MRI brain 06/23/2017 FINDINGS: CT HEAD FINDINGS  Brain: No evidence of acute infarction, hemorrhage, hydrocephalus, extra-axial collection or mass lesion/mass effect. Mild cerebral atrophy. Patchy low-attenuation changes in the deep white matter consistent with small vessel ischemia. 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 Vascular: Moderate intracranial arterial vascular calcifications. Skull: Calvarium appears intact. Sinuses/Orbits: Paranasal sinuses and mastoid air cells are clear. Other: None. CT CERVICAL SPINE FINDINGS Alignment: Straightening of usual cervical lordosis is likely positional or degenerative. No anterior subluxations. Normal alignment of the facet joints. C1-2 articulation appears intact. Head tilt stores the right, likely positional but could indicate muscle spasm. Skull base and vertebrae: No acute fracture. No primary bone lesion or focal pathologic process. Soft tissues and spinal canal: No prevertebral fluid or swelling. No visible canal hematoma. Disc levels: Degenerative changes throughout with narrowed disc spaces and endplate osteophyte formation. Degenerative changes in the facet joints. Upper chest: Lung apices are clear. Other: None. IMPRESSION: 1. No  acute intracranial abnormalities. Mild cerebral atrophy and small vessel ischemic changes. 2. Nonspecific straightening of usual cervical lordosis. Diffuse degenerative change. No acute displaced fractures identified. Electronically Signed   By: Lucienne Capers M.D.   On: 03/17/2021 00:59   CT CERVICAL SPINE WO CONTRAST  Result Date: 03/17/2021 CLINICAL DATA:  Mechanical fall.  Head trauma. EXAM: CT HEAD WITHOUT CONTRAST CT CERVICAL SPINE WITHOUT CONTRAST TECHNIQUE: Multidetector CT imaging of the head and cervical spine was performed following the standard protocol without intravenous contrast. Multiplanar CT image reconstructions of the cervical spine were also generated. COMPARISON:  MRI brain 06/23/2017 FINDINGS: CT HEAD FINDINGS Brain: No evidence of acute infarction,  hemorrhage, hydrocephalus, extra-axial collection or mass lesion/mass effect. Mild cerebral atrophy. Patchy low-attenuation changes in the deep white matter consistent with small vessel ischemia. 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 Vascular: Moderate intracranial arterial vascular calcifications. Skull: Calvarium appears intact. Sinuses/Orbits: Paranasal sinuses and mastoid air cells are clear. Other: None. CT CERVICAL SPINE FINDINGS Alignment: Straightening of usual cervical lordosis is likely positional or degenerative. No anterior subluxations. Normal alignment of the facet joints. C1-2 articulation appears intact. Head tilt stores the right, likely positional but could indicate muscle spasm. Skull base and vertebrae: No acute fracture. No primary bone lesion or focal pathologic process. Soft tissues and spinal canal: No prevertebral fluid or swelling. No visible canal hematoma. Disc levels: Degenerative changes throughout with narrowed disc spaces and endplate osteophyte formation. Degenerative changes in the facet joints. Upper chest: Lung apices are clear. Other: None. IMPRESSION: 1. No acute intracranial abnormalities. Mild cerebral atrophy and small vessel ischemic changes. 2. Nonspecific straightening of usual cervical lordosis. Diffuse degenerative change. No acute displaced fractures identified. Electronically Signed   By: Lucienne Capers M.D.   On: 03/17/2021 00:59   CT CHEST ABDOMEN PELVIS W CONTRAST  Result Date: 03/17/2021 CLINICAL DATA:  Mechanical fall.  Right hip pain. EXAM: CT CHEST, ABDOMEN, AND PELVIS WITH CONTRAST TECHNIQUE: Multidetector CT imaging of the chest, abdomen and pelvis was performed following the standard protocol during bolus administration of intravenous contrast. CONTRAST:  1105mL OMNIPAQUE IOHEXOL 300 MG/ML  SOLN COMPARISON:  Chest radiograph 03/16/2021 FINDINGS: CT CHEST FINDINGS Cardiovascular: Normal heart size. No pericardial effusions. Coronary artery,  aortic, and aortic valve calcifications. Normal caliber thoracic aorta. No dissection. Great vessel origins are patent. Mediastinum/Nodes: Esophagus is decompressed. No significant lymphadenopathy. Thyroid gland is unremarkable. No abnormal mediastinal gas or fluid collections. Lungs/Pleura: Atelectasis in the right lung base. Lungs are otherwise clear and expanded. No pleural effusions. No pneumothorax. Airways are patent. Musculoskeletal: Comminuted and displaced fractures of the right humeral head and neck. No glenohumeral dislocation. Right shoulder effusion. Intramuscular lipoma in the posterior shoulder musculature. Large cystic collections anterior to the left shoulder and in the subcoracoid region likely bursal collections. Prominent degenerative changes in the left shoulder. Normal alignment of the thoracic spine. Diffuse degenerative changes. No acute compression deformities identified. Visualized ribs appear intact. No sternal depression. CT ABDOMEN PELVIS FINDINGS Hepatobiliary: No focal liver abnormality is seen. Status post cholecystectomy. No biliary dilatation. Pancreas: Unremarkable. No pancreatic ductal dilatation or surrounding inflammatory changes. Spleen: Normal in size without focal abnormality. Adrenals/Urinary Tract: No adrenal gland nodules. Bilateral renal cysts. Nephrograms are homogeneous and normal. No hydronephrosis or hydroureter. Bladder is displaced towards the left by right obturator/ileus psoas hematoma. Stomach/Bowel: Stomach is within normal limits. Appendix appears normal. No evidence of bowel wall thickening, distention, or inflammatory changes. Vascular/Lymphatic: Aortic atherosclerosis. No enlarged abdominal or pelvic  lymph nodes. Reproductive: Status post hysterectomy. No adnexal masses. Other: No free air or free fluid in the abdomen. Musculoskeletal: Degenerative changes in the lumbar spine. Normal alignment. No vertebral compression deformities. Acute linear nondisplaced  fractures are demonstrated in the right sacral ala extending through the SI joint into the posterosuperior right iliac bone. The SI joint is not significantly widened. Mildly displaced fractures of the right superior and inferior pubic rami. Associated hematomas in the right obturator and ileus psoas regions with displacement of the bladder towards the right. Degenerative changes in the hips. Previous internal fixation of the left hip. No hip fractures are demonstrated. Lumbar scoliosis convex towards the left. IMPRESSION: 1. No evidence of mediastinal or pulmonary parenchymal injury. No evidence of solid abdominal organ injury or bowel perforation. 2. Comminuted fractures of the proximal right humerus. 3. Nondisplaced fractures of the right sacral ala extending through the SI joint into the posterosuperior right iliac bone. No widening of the sacral iliac joint. 4. Mildly displaced fractures of the right superior and inferior pubic rami with associated hematomas in the iliopsoas and obturator regions. Hematoma causes displacement bladder towards the left. 5. Degenerative changes in the left shoulder with large bursal collections. 6. Right lung base atelectasis. 7. Aortic atherosclerosis. Electronically Signed   By: Lucienne Capers M.D.   On: 03/17/2021 01:15   DG Chest Portable 1 View  Result Date: 03/16/2021 CLINICAL DATA:  Trauma.  Mechanical fall. EXAM: PORTABLE CHEST 1 VIEW COMPARISON:  Chest x-ray 09/13/2020. FINDINGS: The heart size and mediastinal contours are unchanged. Aortic calcification. Similar-appearing elevated right hemidiaphragm. Right base atelectasis. No focal consolidation. No pulmonary edema. No pleural effusion. No pneumothorax. Comminuted right humeral fracture new from 09/13/2020. IMPRESSION: 1. Comminuted right humeral fracture new from 09/13/2020. please see separately dictated dedicated right shoulder radiographs 03/16/2021. 2. No acute cardiopulmonary abnormality. Electronically  Signed   By: Iven Finn M.D.   On: 03/16/2021 23:30   DG Humerus Right  Result Date: 03/16/2021 CLINICAL DATA:  Pain and limited range of motion after a fall. EXAM: RIGHT HUMERUS - 2+ VIEW COMPARISON:  03/05/2018 FINDINGS: Acute comminuted fractures of the right humeral head and neck with superior displacement and impaction of the distal fracture fragments resulting in varus angulation. The midshaft and distal right humerus are intact. Diffuse soft tissue swelling. IMPRESSION: Acute comminuted fractures of the right humeral head and neck with varus angulation. Electronically Signed   By: Lucienne Capers M.D.   On: 03/16/2021 22:58   DG Hip Unilat With Pelvis 2-3 Views Right  Result Date: 03/16/2021 CLINICAL DATA:  Pain and limited range of motion after a fall EXAM: DG HIP (WITH OR WITHOUT PELVIS) 2-3V RIGHT COMPARISON:  None. FINDINGS: Degenerative changes in the lower lumbar spine and hips. Postoperative internal fixation of the left proximal femur. Acute appearing displaced fractures of the right superior and inferior pubic rami. No right hip fracture identified. SI joints and symphysis pubis are not displaced. IMPRESSION: Acute displaced fractures of the right superior and inferior pubic rami. Degenerative changes in the lower lumbar spine and hips. Electronically Signed   By: Lucienne Capers M.D.   On: 03/16/2021 22:57     Scheduled Meds: Continuous Infusions:  lactated ringers       LOS: 1 day   Time spent: [place], MD [facility] To contact the attending provider between 7A-7P or the covering provider during after hours 7P-7A, please log into the web site [email or web address] and access using universal [facility] password for that web site. If you do not have the password, please call the hospital operator.  03/17/2021, 9:29 AM

## 2021-03-17 NOTE — Progress Notes (Signed)
Orthopedic Tech Progress Note Patient Details:  Terri Liu [birth year] [id number]  Ortho Devices Type of Ortho Device: Sling immobilizer Ortho Device/Splint Location: rue Ortho Device/Splint Interventions: Ordered, Application, Adjustment   Post Interventions Patient Tolerated: Well Instructions Provided: Care of device, Adjustment of device  Karolee Stamps 03/17/2021, 6:15 AM

## 2021-03-17 NOTE — Hospital Course (Signed)
87 year old white female HTN, postherpetic neuralgia, HLD, left hip fracture 01/2017 COVID-pneumonia 09/14/2020 status post remdesivir, steroids HFpEF/HFrEF-Echo 40-45% 12/31/2019 + chronic LBBB  Status post Baker's cyst fluid drainage 03/16/2021-left knee subsequently gave out patient fell onto her right side-hit right face-severe pain right arm Hochrein In emergency room trauma work-up revealed fracture right humeral head and neck sacral ala and superior inferior pubic rami on the right  Orthopedics and trauma were consulted-patient kept n.p.o. aspirin held-patient placed in a sling

## 2021-03-17 NOTE — H&P (Addendum)
History and Physical    Terri Liu YQM:[id number] DOB: [birth year] DOA: 03/16/2021  PCP: Leeroy Cha, MD   Patient coming from: Home   Chief Complaint: Fall with right arm and hip pain   HPI: Terri Liu is a 87 y.o. female with medical history significant for chronic combined systolic and diastolic CHF, hypertension, depression, now presenting to the emergency department with right arm and hip pain after a fall.  Patient reports that she had been in her usual state of health, had some fluid drained from a Baker's cyst on the left, then felt as though her left knee gave out, causing her to fall onto her right side.  She hit her right face but does not have much pain in the area and denies any headache or loss of consciousness.  She has been experiencing severe pain in the proximal right arm and in her right groin.  She had a GI illness recently but had recovered from that and was in her usual state of health prior to this injury.  She has been living alone since her husband passed away last 01/19/2023 and is independent with all ADLs.  ED Course: Upon arrival to the ED, patient is found to be afebrile, saturating low 90s on room air, and with blood pressure 109/50.  EKG features sinus rhythm with chronic LBBB.  Chest x-ray negative for acute cardiopulmonary disease.  Chemistry panel with mild hyponatremia.  CBC with leukocytosis to 14,300.  COVID-19 screening test is negative.  CT head, cervical spine, chest, abdomen, and pelvis were performed and the patient is found to have fractures involving the right humeral head and neck, sacral ala, and superior and inferior pubic rami on the right.  Orthopedic surgery and trauma surgery were consulted by the ED physician and medical admission was recommended.  Review of Systems:  All other systems reviewed and apart from HPI, are negative.  Past Medical History:  Diagnosis Date   Arthritis    Diarrhea    "sometimes ., due to Gallbladder  removed"   High cholesterol    History of shingles    effecrt nerve sees neurologist in [facility]   Hypertension    Post herpetic neuralgia 08/15/2016   Right V1   Shingles     Past Surgical History:  Procedure Laterality Date   ABDOMINAL HYSTERECTOMY     COMPLETE   CARPAL TUNNEL RELEASE Bilateral    CHOLECYSTECTOMY     EYE SURGERY     Cornea   FEMUR IM NAIL Left 01/12/2017   Procedure: INTRAMEDULLARY (IM) NAIL FEMORAL LEFT;  Surgeon: Rod Can, MD;  Location: [facility];  Service: Orthopedics;  Laterality: Left;   GAS INSERTION Right 01/27/2015   Procedure: INSERTION OF GAS;  Surgeon: Hayden Pedro, MD;  Location: [place];  Service: Ophthalmology;  Laterality: Right;  C3F8   JOINT REPLACEMENT Right 4 YRS AGO   KNEE   KNEE ARTHROSCOPY Bilateral    MEMBRANE PEEL Right 01/27/2015   Procedure: MEMBRANE PEEL;  Surgeon: Hayden Pedro, MD;  Location: [place];  Service: Ophthalmology;  Laterality: Right;   PERFLUORONE INJECTION Right 01/27/2015   Procedure: PERFLUORONE INJECTION;  Surgeon: Hayden Pedro, MD;  Location: [place];  Service: Ophthalmology;  Laterality: Right;   PHOTOCOAGULATION WITH LASER Right 01/27/2015   Procedure: PHOTOCOAGULATION WITH LASER;  Surgeon: Hayden Pedro, MD;  Location: [place];  Service: Ophthalmology;  Laterality: Right;  ENDOLASER   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  01/27/2015   Procedure: REPAIR OF COMPLEX TRACTION RETINAL DETACHMENT;  Surgeon: Hayden Pedro, MD;  Location: [place];  Service: Ophthalmology;  Laterality: Right;   TOTAL KNEE ARTHROPLASTY Left 12/17/2015   Procedure: LEFT TOTAL KNEE ARTHROPLASTY;  Surgeon: Susa Day, MD;  Location: [facility];  Service: Orthopedics;  Laterality: Left;   VITRECTOMY 25 GAUGE WITH SCLERAL BUCKLE Right 01/27/2015   Procedure: VITRECTOMY 25 GAUGE WITH SCLERAL BUCKLE;  Surgeon: Hayden Pedro, MD;  Location: [place];  Service: Ophthalmology;  Laterality: Right;    Social History:   reports that she  has never smoked. She has never used smokeless tobacco. She reports that she does not drink alcohol and does not use drugs.  Allergies  Allergen Reactions   Amlodipine Besylate-Valsartan     Other reaction(s): rash   Cymbalta [Duloxetine Hcl]     Made her very sick (flu-like symptoms)   Hydrocodone-Acetaminophen     Other reaction(s): rash   Lipitor [Atorvastatin] Other (See Comments)    Muscle pain in legs   Other Other (See Comments)    Other reaction(s): OPIOID - analgesic unknown reaction   Tape     Pulled skin off. Paper tape is ok.    Codeine Rash    Family History  Problem Relation Age of Onset   Heart failure Mother    Cancer - Prostate Father    Heart disease Brother    Cancer Sister    Cancer Brother      Prior to Admission medications   Medication Sig Start Date End Date Taking? Authorizing Provider  acetaminophen (TYLENOL) 500 MG tablet Take 1,000 mg by mouth every 8 (eight) hours as needed for mild pain.    [provider]  alendronate (FOSAMAX) 70 MG tablet Take 70 mg by mouth once a week.    [provider]  ascorbic acid (VITAMIN C) 500 MG tablet Take 500 mg by mouth daily.    [provider]  aspirin 81 MG EC tablet Take 1 tablet (81 mg total) by mouth daily. 09/14/20   Cherene Altes, MD  BIOTIN FORTE PO Take 1 tablet by mouth daily at 12 noon.    [provider]  Carboxymethylcellulose Sodium (ARTIFICIAL TEARS OP) Place 1 drop into both eyes daily as needed (dry eyes).    [provider]  cholecalciferol (VITAMIN D) 1000 units tablet Take 1,000 Units by mouth daily.    [provider]  escitalopram (LEXAPRO) 10 MG tablet Take 10 mg by mouth daily as needed (mood). 07/28/20   [provider]  fluticasone (FLONASE) 50 MCG/ACT nasal spray Place 1 spray into both nostrils daily.    [provider]  losartan (COZAAR) 50 MG tablet Take 50 mg by mouth daily. 07/27/20   [provider]  lovastatin (MEVACOR) 40 MG tablet Take 40 mg by mouth daily. 07/29/16   [provider]  metoprolol tartrate (LOPRESSOR) 25 MG tablet Take 25 mg by mouth 2 (two) times daily. 05/23/16   [provider]  Misc Natural Products (TART CHERRY ADVANCED PO) Take 1 tablet by mouth daily.    [provider]  OXcarbazepine (TRILEPTAL) 150 MG tablet Take 1 tablet (150 mg total) by mouth 2 (two) times daily. 05/23/18   Dennie Bible, NP  predniSONE (DELTASONE) 20 MG tablet Take 2 tablets a day on 1/4 and 1/5 and 1/6 THEN 1 tablet a day on 1/7 and 1/8 and 1/9 THEN half a tablet a day  on 1/10 and 1/11 THEN STOP 09/14/20   Cherene Altes, MD  Prenatal Vit-Fe Fumarate-FA (PRENATAL MULTIVITAMIN) TABS Take 1 tablet by mouth every morning.     [provider]  traMADol (ULTRAM) 50 MG tablet Take 50 mg by mouth 2 (two) times daily as needed for moderate pain. 04/13/20   [provider]  vitamin B-12 (CYANOCOBALAMIN) 100 MCG tablet Take 100 mcg by mouth daily.    [provider]    Physical Exam: Vitals:   03/16/21 2152 03/16/21 2200 03/16/21 2230 03/16/21 2300  BP: (!) 136/94 (!) 134/117 (!) 109/50 (!) 120/47  Pulse: 67 79 74 69  Resp: 12 (!) 21 (!) 22 20  Temp: 99.2 F (37.3 C)     TempSrc: Oral     SpO2: 93% 93% (!) 88% 92%  Weight: 80.7 kg     Height: 5\' 4"  (1.626 m)       Constitutional: NAD, calm  Eyes: PERTLA, lids and conjunctivae normal ENMT: Mucous membranes are moist. Posterior pharynx clear of any exudate or lesions.   Neck: supple, no masses  Respiratory:  no wheezing, no crackles. No accessory muscle use.  Cardiovascular: S1 & S2 heard, regular rate and rhythm. No extremity edema.   Abdomen: No distension, no tenderness, soft. Bowel sounds active.  Musculoskeletal: no clubbing / cyanosis. Right arm immobilized in sling, neurovascularly intact distally. Hip tenderness, neurovascularly intact distally.   Skin: no significant  rashes, lesions, ulcers. Warm, dry, well-perfused. Neurologic: CN 2-12 grossly intact. Sensation intact. Strength 5/5 in all 4 limbs.  Psychiatric: Alert and oriented to person, place, and situation. Very pleasant and cooperative.    Labs and Imaging on Admission: I have personally reviewed following labs and imaging studies  CBC: Recent Labs  Lab 03/16/21 2203  WBC 14.3*  NEUTROABS 11.4*  HGB 13.2  HCT 39.7  MCV 96.1  PLT 762   Basic Metabolic Panel: Recent Labs  Lab 03/16/21 2203  NA 133*  K 3.6  CL 97*  CO2 30  GLUCOSE 115*  BUN 15  CREATININE 0.57  CALCIUM 9.0   GFR: Estimated Creatinine Clearance: 53.8 mL/min (by C-G formula based on SCr of 0.57 mg/dL). Liver Function Tests: No results for input(s): AST, ALT, ALKPHOS, BILITOT, PROT, ALBUMIN in the last 168 hours. No results for input(s): LIPASE, AMYLASE in the last 168 hours. No results for input(s): AMMONIA in the last 168 hours. Coagulation Profile: No results for input(s): INR, PROTIME in the last 168 hours. Cardiac Enzymes: No results for input(s): CKTOTAL, CKMB, CKMBINDEX, TROPONINI in the last 168 hours. BNP (last 3 results) No results for input(s): PROBNP in the last 8760 hours. HbA1C: No results for input(s): HGBA1C in the last 72 hours. CBG: No results for input(s): GLUCAP in the last 168 hours. Lipid Profile: No results for input(s): CHOL, HDL, LDLCALC, TRIG, CHOLHDL, LDLDIRECT in the last 72 hours. Thyroid Function Tests: No results for input(s): TSH, T4TOTAL, FREET4, T3FREE, THYROIDAB in the last 72 hours. Anemia Panel: No results for input(s): VITAMINB12, FOLATE, FERRITIN, TIBC, IRON, RETICCTPCT in the last 72 hours. Urine analysis:    Component Value Date/Time   COLORURINE YELLOW 09/19/2017 2330   APPEARANCEUR CLOUDY (A) 09/19/2017 2330   LABSPEC 1.012 09/19/2017 2330   PHURINE 6.0 09/19/2017 2330   GLUCOSEU NEGATIVE 09/19/2017 2330   HGBUR NEGATIVE 09/19/2017 2330   BILIRUBINUR  NEGATIVE 09/19/2017 2330   KETONESUR NEGATIVE 09/19/2017 2330   PROTEINUR NEGATIVE 09/19/2017 2330   UROBILINOGEN 0.2 05/26/2008  [place] 09/19/2017 2330   LEUKOCYTESUR LARGE (A) 09/19/2017 2330   Sepsis Labs: @LABRCNTIP (procalcitonin:4,lacticidven:4) ) Recent Results (from the past 240 hour(s))  Resp Panel by RT-PCR (Flu A&B, Covid) Nasopharyngeal Swab     Status: None   Collection Time: 03/16/21 11:55 PM   Specimen: Nasopharyngeal Swab; Nasopharyngeal(NP) swabs in vial transport medium  Result Value Ref Range Status   SARS Coronavirus 2 by RT PCR NEGATIVE NEGATIVE Final    Comment: (NOTE) SARS-CoV-2 target nucleic acids are NOT DETECTED.  The SARS-CoV-2 RNA is generally detectable in upper respiratory specimens during the acute phase of infection. The lowest concentration of SARS-CoV-2 viral copies this assay can detect is 138 copies/mL. A negative result does not preclude SARS-Cov-2 infection and should not be used as the sole basis for treatment or other patient management decisions. A negative result may occur with  improper specimen collection/handling, submission of specimen other than nasopharyngeal swab, presence of viral mutation(s) within the areas targeted by this assay, and inadequate number of viral copies(<138 copies/mL). A negative result must be combined with clinical observations, patient history, and epidemiological information. The expected result is Negative.  Fact Sheet for Patients:  EntrepreneurPulse.com.au  Fact Sheet for Healthcare Providers:  IncredibleEmployment.be  This test is no t yet approved or cleared by the [place] FDA and  has been authorized for detection and/or diagnosis of SARS-CoV-2 by FDA under an Emergency Use Authorization (EUA). This EUA will remain  in effect (meaning this test can be used) for the duration of the COVID-19 declaration under Section 564(b)(1) of the Act,  21 U.S.C.section 360bbb-3(b)(1), unless the authorization is terminated  or revoked sooner.       Influenza A by PCR NEGATIVE NEGATIVE Final   Influenza B by PCR NEGATIVE NEGATIVE Final    Comment: (NOTE) The Xpert Xpress SARS-CoV-2/FLU/RSV plus assay is intended as an aid in the diagnosis of influenza from Nasopharyngeal swab specimens and should not be used as a sole basis for treatment. Nasal washings and aspirates are unacceptable for Xpert Xpress SARS-CoV-2/FLU/RSV testing.  Fact Sheet for Patients: EntrepreneurPulse.com.au  Fact Sheet for Healthcare Providers: IncredibleEmployment.be  This test is not yet approved or cleared by the [place] FDA and has been authorized for detection and/or diagnosis of SARS-CoV-2 by FDA under an Emergency Use Authorization (EUA). This EUA will remain in effect (meaning this test can be used) for the duration of the COVID-19 declaration under Section 564(b)(1) of the Act, 21 U.S.C. section 360bbb-3(b)(1), unless the authorization is terminated or revoked.  Performed at [facility], [place] [address], [place], [place] [address]      Radiological Exams on Admission: DG Shoulder Right  Result Date: 03/16/2021 CLINICAL DATA:  Pain and limited range of motion after a fall. EXAM: RIGHT SHOULDER - 2+ VIEW COMPARISON:  03/05/2018 FINDINGS: Acute comminuted fractures involving the right humeral head and neck. Fracture lines extend across the greater tuberosity with mild displacement of the tuberosity fragment. Fracture fragments are impacted. No glenohumeral dislocation. Degenerative changes in the glenohumeral and acromioclavicular joints. Increase of the subacromial space likely represents an effusion. IMPRESSION: Acute comminuted fractures of the right humeral head and neck. Electronically Signed   By: Lucienne Capers M.D.   On: 03/16/2021 22:59   CT HEAD WO CONTRAST  Result Date: 03/17/2021 CLINICAL  DATA:  Mechanical fall.  Head trauma. EXAM: CT HEAD WITHOUT CONTRAST CT CERVICAL SPINE WITHOUT CONTRAST TECHNIQUE: Multidetector CT imaging of the head and cervical  spine was performed following the standard protocol without intravenous contrast. Multiplanar CT image reconstructions of the cervical spine were also generated. COMPARISON:  MRI brain 06/23/2017 FINDINGS: CT HEAD FINDINGS Brain: No evidence of acute infarction, hemorrhage, hydrocephalus, extra-axial collection or mass lesion/mass effect. Mild cerebral atrophy. Patchy low-attenuation changes in the deep white matter consistent with small vessel ischemia. 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 Vascular: Moderate intracranial arterial vascular calcifications. Skull: Calvarium appears intact. Sinuses/Orbits: Paranasal sinuses and mastoid air cells are clear. Other: None. CT CERVICAL SPINE FINDINGS Alignment: Straightening of usual cervical lordosis is likely positional or degenerative. No anterior subluxations. Normal alignment of the facet joints. C1-2 articulation appears intact. Head tilt stores the right, likely positional but could indicate muscle spasm. Skull base and vertebrae: No acute fracture. No primary bone lesion or focal pathologic process. Soft tissues and spinal canal: No prevertebral fluid or swelling. No visible canal hematoma. Disc levels: Degenerative changes throughout with narrowed disc spaces and endplate osteophyte formation. Degenerative changes in the facet joints. Upper chest: Lung apices are clear. Other: None. IMPRESSION: 1. No acute intracranial abnormalities. Mild cerebral atrophy and small vessel ischemic changes. 2. Nonspecific straightening of usual cervical lordosis. Diffuse degenerative change. No acute displaced fractures identified. Electronically Signed   By: Lucienne Capers M.D.   On: 03/17/2021 00:59   CT CERVICAL SPINE WO CONTRAST  Result Date: 03/17/2021 CLINICAL DATA:  Mechanical fall.  Head trauma.  EXAM: CT HEAD WITHOUT CONTRAST CT CERVICAL SPINE WITHOUT CONTRAST TECHNIQUE: Multidetector CT imaging of the head and cervical spine was performed following the standard protocol without intravenous contrast. Multiplanar CT image reconstructions of the cervical spine were also generated. COMPARISON:  MRI brain 06/23/2017 FINDINGS: CT HEAD FINDINGS Brain: No evidence of acute infarction, hemorrhage, hydrocephalus, extra-axial collection or mass lesion/mass effect. Mild cerebral atrophy. Patchy low-attenuation changes in the deep white matter consistent with small vessel ischemia. 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 Vascular: Moderate intracranial arterial vascular calcifications. Skull: Calvarium appears intact. Sinuses/Orbits: Paranasal sinuses and mastoid air cells are clear. Other: None. CT CERVICAL SPINE FINDINGS Alignment: Straightening of usual cervical lordosis is likely positional or degenerative. No anterior subluxations. Normal alignment of the facet joints. C1-2 articulation appears intact. Head tilt stores the right, likely positional but could indicate muscle spasm. Skull base and vertebrae: No acute fracture. No primary bone lesion or focal pathologic process. Soft tissues and spinal canal: No prevertebral fluid or swelling. No visible canal hematoma. Disc levels: Degenerative changes throughout with narrowed disc spaces and endplate osteophyte formation. Degenerative changes in the facet joints. Upper chest: Lung apices are clear. Other: None. IMPRESSION: 1. No acute intracranial abnormalities. Mild cerebral atrophy and small vessel ischemic changes. 2. Nonspecific straightening of usual cervical lordosis. Diffuse degenerative change. No acute displaced fractures identified. Electronically Signed   By: Lucienne Capers M.D.   On: 03/17/2021 00:59   CT CHEST ABDOMEN PELVIS W CONTRAST  Result Date: 03/17/2021 CLINICAL DATA:  Mechanical fall.  Right hip pain. EXAM: CT CHEST, ABDOMEN, AND  PELVIS WITH CONTRAST TECHNIQUE: Multidetector CT imaging of the chest, abdomen and pelvis was performed following the standard protocol during bolus administration of intravenous contrast. CONTRAST:  136mL OMNIPAQUE IOHEXOL 300 MG/ML  SOLN COMPARISON:  Chest radiograph 03/16/2021 FINDINGS: CT CHEST FINDINGS Cardiovascular: Normal heart size. No pericardial effusions. Coronary artery, aortic, and aortic valve calcifications. Normal caliber thoracic aorta. No dissection. Great vessel origins are patent. Mediastinum/Nodes: Esophagus is decompressed. No significant lymphadenopathy. Thyroid gland is  unremarkable. No abnormal mediastinal gas or fluid collections. Lungs/Pleura: Atelectasis in the right lung base. Lungs are otherwise clear and expanded. No pleural effusions. No pneumothorax. Airways are patent. Musculoskeletal: Comminuted and displaced fractures of the right humeral head and neck. No glenohumeral dislocation. Right shoulder effusion. Intramuscular lipoma in the posterior shoulder musculature. Large cystic collections anterior to the left shoulder and in the subcoracoid region likely bursal collections. Prominent degenerative changes in the left shoulder. Normal alignment of the thoracic spine. Diffuse degenerative changes. No acute compression deformities identified. Visualized ribs appear intact. No sternal depression. CT ABDOMEN PELVIS FINDINGS Hepatobiliary: No focal liver abnormality is seen. Status post cholecystectomy. No biliary dilatation. Pancreas: Unremarkable. No pancreatic ductal dilatation or surrounding inflammatory changes. Spleen: Normal in size without focal abnormality. Adrenals/Urinary Tract: No adrenal gland nodules. Bilateral renal cysts. Nephrograms are homogeneous and normal. No hydronephrosis or hydroureter. Bladder is displaced towards the left by right obturator/ileus psoas hematoma. Stomach/Bowel: Stomach is within normal limits. Appendix appears normal. No evidence of bowel wall  thickening, distention, or inflammatory changes. Vascular/Lymphatic: Aortic atherosclerosis. No enlarged abdominal or pelvic lymph nodes. Reproductive: Status post hysterectomy. No adnexal masses. Other: No free air or free fluid in the abdomen. Musculoskeletal: Degenerative changes in the lumbar spine. Normal alignment. No vertebral compression deformities. Acute linear nondisplaced fractures are demonstrated in the right sacral ala extending through the SI joint into the posterosuperior right iliac bone. The SI joint is not significantly widened. Mildly displaced fractures of the right superior and inferior pubic rami. Associated hematomas in the right obturator and ileus psoas regions with displacement of the bladder towards the right. Degenerative changes in the hips. Previous internal fixation of the left hip. No hip fractures are demonstrated. Lumbar scoliosis convex towards the left. IMPRESSION: 1. No evidence of mediastinal or pulmonary parenchymal injury. No evidence of solid abdominal organ injury or bowel perforation. 2. Comminuted fractures of the proximal right humerus. 3. Nondisplaced fractures of the right sacral ala extending through the SI joint into the posterosuperior right iliac bone. No widening of the sacral iliac joint. 4. Mildly displaced fractures of the right superior and inferior pubic rami with associated hematomas in the iliopsoas and obturator regions. Hematoma causes displacement bladder towards the left. 5. Degenerative changes in the left shoulder with large bursal collections. 6. Right lung base atelectasis. 7. Aortic atherosclerosis. Electronically Signed   By: Lucienne Capers M.D.   On: 03/17/2021 01:15   DG Chest Portable 1 View  Result Date: 03/16/2021 CLINICAL DATA:  Trauma.  Mechanical fall. EXAM: PORTABLE CHEST 1 VIEW COMPARISON:  Chest x-ray 09/13/2020. FINDINGS: The heart size and mediastinal contours are unchanged. Aortic calcification. Similar-appearing elevated right  hemidiaphragm. Right base atelectasis. No focal consolidation. No pulmonary edema. No pleural effusion. No pneumothorax. Comminuted right humeral fracture new from 09/13/2020. IMPRESSION: 1. Comminuted right humeral fracture new from 09/13/2020. please see separately dictated dedicated right shoulder radiographs 03/16/2021. 2. No acute cardiopulmonary abnormality. Electronically Signed   By: Iven Finn M.D.   On: 03/16/2021 23:30   DG Humerus Right  Result Date: 03/16/2021 CLINICAL DATA:  Pain and limited range of motion after a fall. EXAM: RIGHT HUMERUS - 2+ VIEW COMPARISON:  03/05/2018 FINDINGS: Acute comminuted fractures of the right humeral head and neck with superior displacement and impaction of the distal fracture fragments resulting in varus angulation. The midshaft and distal right humerus are intact. Diffuse soft tissue swelling. IMPRESSION: Acute comminuted fractures of the right humeral head and neck with varus angulation.  Electronically Signed   By: Lucienne Capers M.D.   On: 03/16/2021 22:58   DG Hip Unilat With Pelvis 2-3 Views Right  Result Date: 03/16/2021 CLINICAL DATA:  Pain and limited range of motion after a fall EXAM: DG HIP (WITH OR WITHOUT PELVIS) 2-3V RIGHT COMPARISON:  None. FINDINGS: Degenerative changes in the lower lumbar spine and hips. Postoperative internal fixation of the left proximal femur. Acute appearing displaced fractures of the right superior and inferior pubic rami. No right hip fracture identified. SI joints and symphysis pubis are not displaced. IMPRESSION: Acute displaced fractures of the right superior and inferior pubic rami. Degenerative changes in the lower lumbar spine and hips. Electronically Signed   By: Lucienne Capers M.D.   On: 03/16/2021 22:57    EKG: Independently reviewed. Sinus rhythm, chronic LBBB.   Assessment/Plan   1. Proximal right humerus fracture; pelvic fractures  - Patient presents with right arm and hip pain after a ground level  fall and is found to have comminuted fx of rt humeral head and neck, nondisplaced fxs of right sacral ala, and mildly displaced right sup and inf pubic rami fxs  - Orthopedic and trauma surgery consulted by ED PA  - Keep NPO and hold ASA pending surgical consultation, continue pain-control and supportive care    2. Chronic combined systolic and diastolic CHF  - EF was 97-94% with grade 1 diastolic dysfunction, mild MR, and mild AI in April 2021  - Appears hypovolemic on admission  - Gentle IVF hydration while npo, monitor volume status    3. Hypertension  - BP low-normal in ED and antihypertensives held initially   4. Leukocytosis  - WBC 14,300 on admission without fever or apparent infectious process  - Likely reactive, monitor, culture if febrile    DVT prophylaxis: SCDs  Code Status: DNR, confirmed with patient on admission  Level of Care: Level of care: Med-Surg Family Communication: Daughter updated at bedside  Disposition Plan:  Patient is from: Home  Anticipated d/c is to: TBD Anticipated d/c date is: 03/20/21 Patient currently: Pending surgical consultation  Consults called: Orthopedic and trauma surgery consulted by ED PA  Admission status: Inpatient     Vianne Bulls, MD [facility]  03/17/2021, 2:05 AM

## 2021-03-17 NOTE — Consult Note (Signed)
Brief orthopedic consult note:  Patient admitted after fall with right proximal humerus fracture and right sided superior and inferior pubic ramus fractures.  Orthopedics was consulted.  After reviewing the images the patient may weight-bear as tolerated on the right lower extremity with physical therapy.  We will give her a sling for her right upper extremity.    We will plan to treat these injuries nonoperatively.  Okay for diet from orthopedic standpoint.  She will follow-up in my office in 4 weeks for repeat x-rays of the right shoulder and pelvis.

## 2021-03-17 NOTE — Consult Note (Addendum)
Reason for Consult/Chief Complaint: fall, pelvic fx, humerus fx Consultant: Zenia Resides, MD  [place] is an 87 y.o. female.   HPI: 37F s/p mechanical GLF. She had a Baker's cyst of the L knee drained earlier that day  and reports that the ipsilateral knee gave out on her when standing and she fell landing on her right side. She denies hitting her head or losing consciousness. Her daughter is at bedside.  Past Medical History:  Diagnosis Date   Arthritis    Diarrhea    "sometimes ., due to Gallbladder removed"   High cholesterol    History of shingles    effecrt nerve sees neurologist in [facility]   Hypertension    Post herpetic neuralgia 08/15/2016   Right V1   Shingles     Past Surgical History:  Procedure Laterality Date   ABDOMINAL HYSTERECTOMY     COMPLETE   CARPAL TUNNEL RELEASE Bilateral    CHOLECYSTECTOMY     EYE SURGERY     [place]ornea   FEMUR IM NAIL Left 01/12/2017   Procedure: INTRAMEDULLARY (IM) NAIL FEMORAL LEFT;  Surgeon: Rod Can, MD;  Location: [facility];  Service: Orthopedics;  Laterality: Left;   GAS INSERTION Right 01/27/2015   Procedure: INSERTION OF GAS;  Surgeon: Hayden Pedro, MD;  Location: [place];  Service: Ophthalmology;  Laterality: Right;  C3F8   JOINT REPLACEMENT Right 4 YRS AGO   KNEE   KNEE ARTHROSCOPY Bilateral    MEMBRANE PEEL Right 01/27/2015   Procedure: MEMBRANE PEEL;  Surgeon: Hayden Pedro, MD;  Location: [place];  Service: Ophthalmology;  Laterality: Right;   PERFLUORONE INJECTION Right 01/27/2015   Procedure: PERFLUORONE INJECTION;  Surgeon: Hayden Pedro, MD;  Location: [place];  Service: Ophthalmology;  Laterality: Right;   PHOTOCOAGULATION WITH LASER Right 01/27/2015   Procedure: PHOTOCOAGULATION WITH LASER;  Surgeon: Hayden Pedro, MD;  Location: [place];  Service: Ophthalmology;  Laterality: Right;  ENDOLASER   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   Procedure: REPAIR OF COMPLEX TRACTION RETINAL  DETACHMENT;  Surgeon: Hayden Pedro, MD;  Location: [place];  Service: Ophthalmology;  Laterality: Right;   TOTAL KNEE ARTHROPLASTY Left 12/17/2015   Procedure: LEFT TOTAL KNEE ARTHROPLASTY;  Surgeon: Susa Day, MD;  Location: [facility];  Service: Orthopedics;  Laterality: Left;   VITRECTOMY 25 GAUGE WITH SCLERAL BUCKLE Right 01/27/2015   Procedure: VITRECTOMY 25 GAUGE WITH SCLERAL BUCKLE;  Surgeon: Hayden Pedro, MD;  Location: [place];  Service: Ophthalmology;  Laterality: Right;    Family History  Problem Relation Age of Onset   Heart failure Mother    Cancer - Prostate Father    Heart disease Brother    Cancer Sister    Cancer Brother     Social History:  reports that she has never smoked. She has never used smokeless tobacco. She reports that she does not drink alcohol and does not use drugs.  Allergies:  Allergies  Allergen Reactions   Cymbalta [Duloxetine Hcl] Other (See Comments)    Made her very sick (flu-like symptoms)   Lipitor [Atorvastatin] Other (See Comments)    Muscle pain in legs   Other Other (See Comments)    Other reaction(s): OPIOID - analgesic unknown reaction   Amlodipine Besylate-Valsartan Rash   Codeine Rash   Hydrocodone-Acetaminophen Rash   Tape Other (See Comments)    Pulled skin off. Paper tape is ok.     Medications: I  have reviewed the patient's current medications.  Results for orders placed or performed during the hospital encounter of 03/16/21 (from the past 48 hour(s))  Basic metabolic panel     Status: Abnormal   Collection Time: 03/16/21 10:03 PM  Result Value Ref Range   Sodium 133 (L) 135 - 145 mmol/L   Potassium 3.6 3.5 - 5.1 mmol/L   Chloride 97 (L) 98 - 111 mmol/L   CO2 30 22 - 32 mmol/L   Glucose, Bld 115 (H) 70 - 99 mg/dL    Comment: Glucose reference range applies only to samples taken after fasting for at least 8 hours.   BUN 15 8 - 23 mg/dL   Creatinine, Ser 0.57 0.44 - 1.00 mg/dL   Calcium 9.0 8.9 - 10.3 mg/dL   GFR,  Estimated >60 >60 mL/min    Comment: (NOTE) Calculated using the CKD-EPI Creatinine Equation (2021)    Anion gap 6 5 - 15    Comment: Performed at [facility] [address] [place], [place] [address]  CBC with Differential     Status: Abnormal   Collection Time: 03/16/21 10:03 PM  Result Value Ref Range   WBC 14.3 (H) 4.0 - 10.5 K/uL   RBC 4.13 3.87 - 5.11 MIL/uL   Hemoglobin 13.2 12.0 - 15.0 g/dL   HCT 39.7 36.0 - 46.0 %   MCV 96.1 80.0 - 100.0 fL   MCH 32.0 26.0 - 34.0 pg   MCHC 33.2 30.0 - 36.0 g/dL   RDW 12.7 11.5 - 15.5 %   Platelets 221 150 - 400 K/uL   nRBC 0.0 0.0 - 0.2 %   Neutrophils Relative % 79 %   Neutro Abs 11.4 (H) 1.7 - 7.7 K/uL   Lymphocytes Relative 11 %   Lymphs Abs 1.6 0.7 - 4.0 K/uL   Monocytes Relative 8 %   Monocytes Absolute 1.1 (H) 0.1 - 1.0 K/uL   Eosinophils Relative 1 %   Eosinophils Absolute 0.1 0.0 - 0.5 K/uL   Basophils Relative 0 %   Basophils Absolute 0.0 0.0 - 0.1 K/uL   Immature Granulocytes 1 %   Abs Immature Granulocytes 0.18 (H) 0.00 - 0.07 K/uL    Comment: Performed at [facility] [address], [place], [place] [address]  Resp Panel by RT-PCR (Flu A&B, Covid) Nasopharyngeal Swab     Status: None   Collection Time: 03/16/21 11:55 PM   Specimen: Nasopharyngeal Swab; Nasopharyngeal(NP) swabs in vial transport medium  Result Value Ref Range   SARS Coronavirus 2 by RT PCR NEGATIVE NEGATIVE    Comment: (NOTE) SARS-CoV-2 target nucleic acids are NOT DETECTED.  The SARS-CoV-2 RNA is generally detectable in upper respiratory specimens during the acute phase of infection. The lowest concentration of SARS-CoV-2 viral copies this assay can detect is 138 copies/mL. A negative result does not preclude SARS-Cov-2 infection and should not be used as the sole basis for treatment or other patient management decisions. A negative result may occur with  improper specimen collection/handling, submission of specimen other than  nasopharyngeal swab, presence of viral mutation(s) within the areas targeted by this assay, and inadequate number of viral copies(<138 copies/mL). A negative result must be combined with clinical observations, patient history, and epidemiological information. The expected result is Negative.  Fact Sheet for Patients:  EntrepreneurPulse.com.au  Fact Sheet for Healthcare Providers:  IncredibleEmployment.be  This test is no t yet approved or cleared by the [place] FDA and  has been  authorized for detection and/or diagnosis of SARS-CoV-2 by FDA under an Emergency Use Authorization (EUA). This EUA will remain  in effect (meaning this test can be used) for the duration of the COVID-19 declaration under Section 564(b)(1) of the Act, 21 U.S.C.section 360bbb-3(b)(1), unless the authorization is terminated  or revoked sooner.       Influenza A by PCR NEGATIVE NEGATIVE   Influenza B by PCR NEGATIVE NEGATIVE    Comment: (NOTE) The Xpert Xpress SARS-CoV-2/FLU/RSV plus assay is intended as an aid in the diagnosis of influenza from Nasopharyngeal swab specimens and should not be used as a sole basis for treatment. Nasal washings and aspirates are unacceptable for Xpert Xpress SARS-CoV-2/FLU/RSV testing.  Fact Sheet for Patients: EntrepreneurPulse.com.au  Fact Sheet for Healthcare Providers: IncredibleEmployment.be  This test is not yet approved or cleared by the [place] FDA and has been authorized for detection and/or diagnosis of SARS-CoV-2 by FDA under an Emergency Use Authorization (EUA). This EUA will remain in effect (meaning this test can be used) for the duration of the COVID-19 declaration under Section 564(b)(1) of the Act, 21 U.S.C. section 360bbb-3(b)(1), unless the authorization is terminated or revoked.  Performed at [facility], [place] [address], [place], [place] [address]   Basic  metabolic panel     Status: Abnormal   Collection Time: 03/17/21  4:35 AM  Result Value Ref Range   Sodium 135 135 - 145 mmol/L   Potassium 4.4 3.5 - 5.1 mmol/L   Chloride 99 98 - 111 mmol/L   CO2 29 22 - 32 mmol/L   Glucose, Bld 165 (H) 70 - 99 mg/dL    Comment: Glucose reference range applies only to samples taken after fasting for at least 8 hours.   BUN 15 8 - 23 mg/dL   Creatinine, Ser 0.60 0.44 - 1.00 mg/dL   Calcium 9.0 8.9 - 10.3 mg/dL   GFR, Estimated >60 >60 mL/min    Comment: (NOTE) Calculated using the CKD-EPI Creatinine Equation (2021)    Anion gap 7 5 - 15    Comment: Performed at [facility] [address] [place], [place] [address]  CBC     Status: Abnormal   Collection Time: 03/17/21  4:35 AM  Result Value Ref Range   WBC 11.9 (H) 4.0 - 10.5 K/uL   RBC 3.65 (L) 3.87 - 5.11 MIL/uL   Hemoglobin 11.7 (L) 12.0 - 15.0 g/dL   HCT 35.2 (L) 36.0 - 46.0 %   MCV 96.4 80.0 - 100.0 fL   MCH 32.1 26.0 - 34.0 pg   MCHC 33.2 30.0 - 36.0 g/dL   RDW 12.9 11.5 - 15.5 %   Platelets 207 150 - 400 K/uL   nRBC 0.0 0.0 - 0.2 %    Comment: Performed at [facility], [place] [address], [place], [place] [address]    DG Shoulder Right  Result Date: 03/16/2021 CLINICAL DATA:  Pain and limited range of motion after a fall. EXAM: RIGHT SHOULDER - 2+ VIEW COMPARISON:  03/05/2018 FINDINGS: Acute comminuted fractures involving the right humeral head and neck. Fracture lines extend across the greater tuberosity with mild displacement of the tuberosity fragment. Fracture fragments are impacted. No glenohumeral dislocation. Degenerative changes in the glenohumeral and acromioclavicular joints. Increase of the subacromial space likely represents an effusion. IMPRESSION: Acute comminuted fractures of the right humeral head and neck. Electronically Signed   By: Lucienne Capers M.D.   On: 03/16/2021 22:59  CT HEAD WO CONTRAST  Result Date: 03/17/2021 CLINICAL DATA:  Mechanical fall.   Head trauma. EXAM: CT HEAD WITHOUT CONTRAST CT CERVICAL SPINE WITHOUT CONTRAST TECHNIQUE: Multidetector CT imaging of the head and cervical spine was performed following the standard protocol without intravenous contrast. Multiplanar CT image reconstructions of the cervical spine were also generated. COMPARISON:  MRI brain 06/23/2017 FINDINGS: CT HEAD FINDINGS Brain: No evidence of acute infarction, hemorrhage, hydrocephalus, extra-axial collection or mass lesion/mass effect. Mild cerebral atrophy. Patchy low-attenuation changes in the deep white matter consistent with small vessel ischemia. 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 Vascular: Moderate intracranial arterial vascular calcifications. Skull: Calvarium appears intact. Sinuses/Orbits: Paranasal sinuses and mastoid air cells are clear. Other: None. CT CERVICAL SPINE FINDINGS Alignment: Straightening of usual cervical lordosis is likely positional or degenerative. No anterior subluxations. Normal alignment of the facet joints. C1-2 articulation appears intact. Head tilt stores the right, likely positional but could indicate muscle spasm. Skull base and vertebrae: No acute fracture. No primary bone lesion or focal pathologic process. Soft tissues and spinal canal: No prevertebral fluid or swelling. No visible canal hematoma. Disc levels: Degenerative changes throughout with narrowed disc spaces and endplate osteophyte formation. Degenerative changes in the facet joints. Upper chest: Lung apices are clear. Other: None. IMPRESSION: 1. No acute intracranial abnormalities. Mild cerebral atrophy and small vessel ischemic changes. 2. Nonspecific straightening of usual cervical lordosis. Diffuse degenerative change. No acute displaced fractures identified. Electronically Signed   By: Lucienne Capers M.D.   On: 03/17/2021 00:59   CT CERVICAL SPINE WO CONTRAST  Result Date: 03/17/2021 CLINICAL DATA:  Mechanical fall.  Head trauma. EXAM: CT HEAD WITHOUT  CONTRAST CT CERVICAL SPINE WITHOUT CONTRAST TECHNIQUE: Multidetector CT imaging of the head and cervical spine was performed following the standard protocol without intravenous contrast. Multiplanar CT image reconstructions of the cervical spine were also generated. COMPARISON:  MRI brain 06/23/2017 FINDINGS: CT HEAD FINDINGS Brain: No evidence of acute infarction, hemorrhage, hydrocephalus, extra-axial collection or mass lesion/mass effect. Mild cerebral atrophy. Patchy low-attenuation changes in the deep white matter consistent with small vessel ischemia. 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 Vascular: Moderate intracranial arterial vascular calcifications. Skull: Calvarium appears intact. Sinuses/Orbits: Paranasal sinuses and mastoid air cells are clear. Other: None. CT CERVICAL SPINE FINDINGS Alignment: Straightening of usual cervical lordosis is likely positional or degenerative. No anterior subluxations. Normal alignment of the facet joints. C1-2 articulation appears intact. Head tilt stores the right, likely positional but could indicate muscle spasm. Skull base and vertebrae: No acute fracture. No primary bone lesion or focal pathologic process. Soft tissues and spinal canal: No prevertebral fluid or swelling. No visible canal hematoma. Disc levels: Degenerative changes throughout with narrowed disc spaces and endplate osteophyte formation. Degenerative changes in the facet joints. Upper chest: Lung apices are clear. Other: None. IMPRESSION: 1. No acute intracranial abnormalities. Mild cerebral atrophy and small vessel ischemic changes. 2. Nonspecific straightening of usual cervical lordosis. Diffuse degenerative change. No acute displaced fractures identified. Electronically Signed   By: Lucienne Capers M.D.   On: 03/17/2021 00:59   CT CHEST ABDOMEN PELVIS W CONTRAST  Result Date: 03/17/2021 CLINICAL DATA:  Mechanical fall.  Right hip pain. EXAM: CT CHEST, ABDOMEN, AND PELVIS WITH CONTRAST  TECHNIQUE: Multidetector CT imaging of the chest, abdomen and pelvis was performed following the standard protocol during bolus administration of intravenous contrast. CONTRAST:  181mL OMNIPAQUE IOHEXOL 300 MG/ML  SOLN COMPARISON:  Chest radiograph 03/16/2021 FINDINGS: CT CHEST  FINDINGS Cardiovascular: Normal heart size. No pericardial effusions. Coronary artery, aortic, and aortic valve calcifications. Normal caliber thoracic aorta. No dissection. Great vessel origins are patent. Mediastinum/Nodes: Esophagus is decompressed. No significant lymphadenopathy. Thyroid gland is unremarkable. No abnormal mediastinal gas or fluid collections. Lungs/Pleura: Atelectasis in the right lung base. Lungs are otherwise clear and expanded. No pleural effusions. No pneumothorax. Airways are patent. Musculoskeletal: Comminuted and displaced fractures of the right humeral head and neck. No glenohumeral dislocation. Right shoulder effusion. Intramuscular lipoma in the posterior shoulder musculature. Large cystic collections anterior to the left shoulder and in the subcoracoid region likely bursal collections. Prominent degenerative changes in the left shoulder. Normal alignment of the thoracic spine. Diffuse degenerative changes. No acute compression deformities identified. Visualized ribs appear intact. No sternal depression. CT ABDOMEN PELVIS FINDINGS Hepatobiliary: No focal liver abnormality is seen. Status post cholecystectomy. No biliary dilatation. Pancreas: Unremarkable. No pancreatic ductal dilatation or surrounding inflammatory changes. Spleen: Normal in size without focal abnormality. Adrenals/Urinary Tract: No adrenal gland nodules. Bilateral renal cysts. Nephrograms are homogeneous and normal. No hydronephrosis or hydroureter. Bladder is displaced towards the left by right obturator/ileus psoas hematoma. Stomach/Bowel: Stomach is within normal limits. Appendix appears normal. No evidence of bowel wall thickening,  distention, or inflammatory changes. Vascular/Lymphatic: Aortic atherosclerosis. No enlarged abdominal or pelvic lymph nodes. Reproductive: Status post hysterectomy. No adnexal masses. Other: No free air or free fluid in the abdomen. Musculoskeletal: Degenerative changes in the lumbar spine. Normal alignment. No vertebral compression deformities. Acute linear nondisplaced fractures are demonstrated in the right sacral ala extending through the SI joint into the posterosuperior right iliac bone. The SI joint is not significantly widened. Mildly displaced fractures of the right superior and inferior pubic rami. Associated hematomas in the right obturator and ileus psoas regions with displacement of the bladder towards the right. Degenerative changes in the hips. Previous internal fixation of the left hip. No hip fractures are demonstrated. Lumbar scoliosis convex towards the left. IMPRESSION: 1. No evidence of mediastinal or pulmonary parenchymal injury. No evidence of solid abdominal organ injury or bowel perforation. 2. Comminuted fractures of the proximal right humerus. 3. Nondisplaced fractures of the right sacral ala extending through the SI joint into the posterosuperior right iliac bone. No widening of the sacral iliac joint. 4. Mildly displaced fractures of the right superior and inferior pubic rami with associated hematomas in the iliopsoas and obturator regions. Hematoma causes displacement bladder towards the left. 5. Degenerative changes in the left shoulder with large bursal collections. 6. Right lung base atelectasis. 7. Aortic atherosclerosis. Electronically Signed   By: Lucienne Capers M.D.   On: 03/17/2021 01:15   DG Chest Portable 1 View  Result Date: 03/16/2021 CLINICAL DATA:  Trauma.  Mechanical fall. EXAM: PORTABLE CHEST 1 VIEW COMPARISON:  Chest x-ray 09/13/2020. FINDINGS: The heart size and mediastinal contours are unchanged. Aortic calcification. Similar-appearing elevated right  hemidiaphragm. Right base atelectasis. No focal consolidation. No pulmonary edema. No pleural effusion. No pneumothorax. Comminuted right humeral fracture new from 09/13/2020. IMPRESSION: 1. Comminuted right humeral fracture new from 09/13/2020. please see separately dictated dedicated right shoulder radiographs 03/16/2021. 2. No acute cardiopulmonary abnormality. Electronically Signed   By: Iven Finn M.D.   On: 03/16/2021 23:30   DG Humerus Right  Result Date: 03/16/2021 CLINICAL DATA:  Pain and limited range of motion after a fall. EXAM: RIGHT HUMERUS - 2+ VIEW COMPARISON:  03/05/2018 FINDINGS: Acute comminuted fractures of the right humeral head and neck with superior displacement and  impaction of the distal fracture fragments resulting in varus angulation. The midshaft and distal right humerus are intact. Diffuse soft tissue swelling. IMPRESSION: Acute comminuted fractures of the right humeral head and neck with varus angulation. Electronically Signed   By: Lucienne Capers M.D.   On: 03/16/2021 22:58   DG Hip Unilat With Pelvis 2-3 Views Right  Result Date: 03/16/2021 CLINICAL DATA:  Pain and limited range of motion after a fall EXAM: DG HIP (WITH OR WITHOUT PELVIS) 2-3V RIGHT COMPARISON:  None. FINDINGS: Degenerative changes in the lower lumbar spine and hips. Postoperative internal fixation of the left proximal femur. Acute appearing displaced fractures of the right superior and inferior pubic rami. No right hip fracture identified. SI joints and symphysis pubis are not displaced. IMPRESSION: Acute displaced fractures of the right superior and inferior pubic rami. Degenerative changes in the lower lumbar spine and hips. Electronically Signed   By: Lucienne Capers M.D.   On: 03/16/2021 22:57    ROS 10 point review of systems is negative except as listed above in HPI.   Physical Exam Blood pressure (!) 95/55, pulse 70, temperature 99.2 F (37.3 C), temperature source Oral, resp. rate (!) 26,  height 5\' 4"  (1.626 m), weight 80.7 kg, SpO2 97 %. Constitutional: well-developed, well-nourished HEENT: pupils equal, round, reactive to light, 74mm b/l, moist conjunctiva, external inspection of ears and nose normal, hearing intact Oropharynx: normal oropharyngeal mucosa, normal dentition Neck: no thyromegaly, trachea midline, no midline cervical tenderness to palpation Chest: breath sounds equal bilaterally, normal respiratory effort, no midline or lateral chest wall tenderness to palpation/deformity Abdomen: soft, NT, no bruising, no hepatosplenomegaly GU: normal female genitalia  Back: no wounds, no thoracic/lumbar spine tenderness to palpation, no thoracic/lumbar spine stepoffs Rectal: deferred Extremities: 2+ radial and pedal pulses bilaterally, motor and sensation intact to bilateral UE and LE,  trace  peripheral edema of RLE MSK: unable to assess gait/station, no clubbing/cyanosis of fingers/toes, limited ROM of RUE 2/2 pain, otherwise normal ROM of extremities Skin: warm, dry, no rashes Psych: normal memory, normal mood/affect    Assessment/Plan: 47F s/p mechanical GLF  R sacral ala fx extending into SI joint and R superior and inferior pubic rami fx with associated pelvic hematoma - rec ortho c/s, recheck CBC this PM, send UA R humerus fx - rec ortho c/s FEN - okay for regular diet if no surgery per ortho DVT - SCDs, rec initiation of 30mg  BID when hgb confirmed to be stable, potentially 7/7 Dispo - IMS to admit to inpatient   Jesusita Oka, MD General and [facility]

## 2021-03-17 NOTE — ED Notes (Signed)
Dr. Jenelle Mages at bedside

## 2021-03-17 NOTE — ED Notes (Signed)
Pt placed back on 3L [place] for sats of 88% (before morphine given).  Sats improved to 99%.

## 2021-03-17 NOTE — ED Notes (Signed)
Called to give report, floor prefers report once the room is clean.

## 2021-03-17 NOTE — ED Notes (Signed)
Ortho tech called for shoulder immobilizer application

## 2021-03-17 NOTE — TOC CAGE-AID Note (Signed)
Transition of Care [facility]) - CAGE-AID Screening   Patient Details  Name: Terri Liu MRN: [id number] Date of Birth: 06/09/1934  Transition of Care [facility]) CM/SW Contact:    Markie Frith C Tarpley-Carter, [place] Phone Number: 03/17/2021, 12:23 PM   Clinical Narrative: Pt did participate, but has no substance use.  Fionnuala Hemmerich Tarpley-Carter, MSW, LCSW-A Pronouns:  She/Her/Hers                          [place] Clinical Social WorkerTransitions of Care Cell:  [phone] [email or web address]    CAGE-AID Screening: Substance Abuse Screening unable to be completed due to: : Patient unable to participate  Have You Ever Felt You Ought to Cut Down on Your Drinking or Drug Use?: No Have People Annoyed You By Critizing Your Drinking Or Drug Use?: No Have You Felt Bad Or Guilty About Your Drinking Or Drug Use?: No Have You Ever Had a Drink or Used Drugs First Thing In The Morning to Steady Your Nerves or to Get Rid of a Hangover?: No CAGE-AID Score: 0

## 2021-03-18 ENCOUNTER — Inpatient Hospital Stay (HOSPITAL_COMMUNITY): Payer: PPO

## 2021-03-18 LAB — CBC
HCT: 32.9 % — ABNORMAL LOW (ref 36.0–46.0)
HCT: 31.4 % — ABNORMAL LOW (ref 36.0–46.0)
HCT: 26 % — ABNORMAL LOW (ref 36.0–46.0)
HCT: 26.5 % — ABNORMAL LOW (ref 36.0–46.0)
Hemoglobin: 10.6 g/dL — ABNORMAL LOW (ref 12.0–15.0)
Hemoglobin: 10.3 g/dL — ABNORMAL LOW (ref 12.0–15.0)
Hemoglobin: 8.5 g/dL — ABNORMAL LOW (ref 12.0–15.0)
Hemoglobin: 8.9 g/dL — ABNORMAL LOW (ref 12.0–15.0)
MCH: 31.9 pg (ref 26.0–34.0)
MCH: 32.5 pg (ref 26.0–34.0)
MCH: 32 pg (ref 26.0–34.0)
MCH: 32.7 pg (ref 26.0–34.0)
MCHC: 32.2 g/dL (ref 30.0–36.0)
MCHC: 32.8 g/dL (ref 30.0–36.0)
MCHC: 32.7 g/dL (ref 30.0–36.0)
MCHC: 33.6 g/dL (ref 30.0–36.0)
MCV: 99.1 fL (ref 80.0–100.0)
MCV: 99.1 fL (ref 80.0–100.0)
MCV: 97.7 fL (ref 80.0–100.0)
MCV: 97.4 fL (ref 80.0–100.0)
Platelets: UNDETERMINED 10*3/uL (ref 150–400)
Platelets: 156 10*3/uL (ref 150–400)
Platelets: 168 10*3/uL (ref 150–400)
Platelets: 148 10*3/uL — ABNORMAL LOW (ref 150–400)
RBC: 3.32 MIL/uL — ABNORMAL LOW (ref 3.87–5.11)
RBC: 3.17 MIL/uL — ABNORMAL LOW (ref 3.87–5.11)
RBC: 2.66 MIL/uL — ABNORMAL LOW (ref 3.87–5.11)
RBC: 2.72 MIL/uL — ABNORMAL LOW (ref 3.87–5.11)
RDW: 13.4 % (ref 11.5–15.5)
RDW: 13.5 % (ref 11.5–15.5)
RDW: 13.4 % (ref 11.5–15.5)
RDW: 13.3 % (ref 11.5–15.5)
WBC: 10.6 10*3/uL — ABNORMAL HIGH (ref 4.0–10.5)
WBC: 8.4 10*3/uL (ref 4.0–10.5)
WBC: 8.2 10*3/uL (ref 4.0–10.5)
WBC: 8 10*3/uL (ref 4.0–10.5)
nRBC: 0 % (ref 0.0–0.2)
nRBC: 0 % (ref 0.0–0.2)
nRBC: 0 % (ref 0.0–0.2)
nRBC: 0 % (ref 0.0–0.2)

## 2021-03-18 LAB — TYPE AND SCREEN
ABO/RH(D): A POS
Antibody Screen: NEGATIVE

## 2021-03-18 LAB — BASIC METABOLIC PANEL
Anion gap: 6 (ref 5–15)
BUN: 17 mg/dL (ref 8–23)
CO2: 29 mmol/L (ref 22–32)
Calcium: 8.7 mg/dL — ABNORMAL LOW (ref 8.9–10.3)
Chloride: 100 mmol/L (ref 98–111)
Creatinine, Ser: 0.79 mg/dL (ref 0.44–1.00)
GFR, Estimated: >60 mL/min (ref >60)
Glucose, Bld: 111 mg/dL — ABNORMAL HIGH (ref 70–99)
Potassium: 4.4 mmol/L (ref 3.5–5.1)
Sodium: 135 mmol/L (ref 135–145)

## 2021-03-18 LAB — VITAMIN D 25 HYDROXY (VIT D DEFICIENCY, FRACTURES): Vit D, 25-Hydroxy: 36.09 ng/mL (ref 30–100)

## 2021-03-18 MED ORDER — POLYETHYLENE GLYCOL 3350 17 G PO PACK
17.0000 g | PACK | Freq: Every day | ORAL | Status: DC
  Administered 2021-03-18 – 2021-03-23 (×6): 17 g via ORAL
  Filled 2021-03-18 (×9): qty 1

## 2021-03-18 MED ORDER — IBUPROFEN 600 MG PO TABS
600.0000 mg | ORAL_TABLET | Freq: Four times a day (QID) | ORAL | Status: DC
  Administered 2021-03-18: 600 mg via ORAL
  Filled 2021-03-18: qty 1

## 2021-03-18 MED ORDER — SODIUM CHLORIDE 0.9 % IV BOLUS
250.0000 mL | Freq: Once | INTRAVENOUS | Status: AC
  Administered 2021-03-18: 250 mL via INTRAVENOUS

## 2021-03-18 MED ORDER — ACETAMINOPHEN 500 MG PO TABS
1000.0000 mg | ORAL_TABLET | Freq: Four times a day (QID) | ORAL | Status: DC
  Administered 2021-03-18 – 2021-03-26 (×27): 1000 mg via ORAL
  Filled 2021-03-18 (×31): qty 2

## 2021-03-18 MED ORDER — METHOCARBAMOL 500 MG PO TABS
1000.0000 mg | ORAL_TABLET | Freq: Three times a day (TID) | ORAL | Status: DC
  Administered 2021-03-18 – 2021-03-26 (×24): 1000 mg via ORAL
  Filled 2021-03-18 (×24): qty 2

## 2021-03-18 MED ORDER — OXYCODONE HCL 5 MG/5ML PO SOLN
5.0000 mg | ORAL | Status: DC | PRN
  Administered 2021-03-18 (×2): 5 mg via ORAL
  Filled 2021-03-18: qty 5
  Filled 2021-03-18: qty 10

## 2021-03-18 MED ORDER — LOSARTAN POTASSIUM 25 MG PO TABS
25.0000 mg | ORAL_TABLET | Freq: Every day | ORAL | Status: DC
  Administered 2021-03-18 – 2021-03-26 (×9): 25 mg via ORAL
  Filled 2021-03-18 (×9): qty 1

## 2021-03-18 MED ORDER — ESCITALOPRAM OXALATE 10 MG PO TABS
10.0000 mg | ORAL_TABLET | Freq: Every day | ORAL | Status: DC
  Administered 2021-03-18 – 2021-03-26 (×9): 10 mg via ORAL
  Filled 2021-03-18 (×9): qty 1

## 2021-03-18 NOTE — Evaluation (Addendum)
Physical Therapy Evaluation Patient Details Name: Terri Liu MRN: [id number] DOB: May 18, 1934 Today's Date: 03/18/2021   History of Present Illness  Pt adm 7/5 after fall causing rt humeral head/neck  fx and sacral ala and superior and inferior pubic rami fx on the rt. PMH - HTN, post herpetic neuralgia, arthritis, lt femur fx, rt TKR, lt TKR, covid  Clinical Impression  Pt presents to PT with expected decr mobility due to pain associated with rt shoulder and rt pelvic fx. Pt is very motivated to return to prior level of function and expect she will make steady progress toward that goa.     Follow Up Recommendations SNF    Equipment Recommendations  Cane;Other (comment) (vs hemiwalker)    Recommendations for Other Services       Precautions / Restrictions Precautions Precautions: Fall Precaution Comments: Following total shoulder precautions until orders are clarified Required Braces or Orthoses: Sling Restrictions Weight Bearing Restrictions: Yes RUE Weight Bearing: Non weight bearing RLE Weight Bearing: Weight bearing as tolerated      Mobility  Bed Mobility Overal bed mobility: Needs Assistance Bed Mobility: Supine to Sit     Supine to sit: Max assist Sit to supine: Mod assist   General bed mobility comments: Assist to move RLE, elevate trunk into sitting, and bring hips to EOB    Transfers                 General transfer comment: Attempted to stand but pt unable to rise due to pain  Ambulation/Gait                Stairs            Wheelchair Mobility    Modified Rankin (Stroke Patients Only)       Balance Overall balance assessment: Needs assistance Sitting-balance support: No upper extremity supported;Feet supported Sitting balance-Leahy Scale: Good                                       Pertinent Vitals/Pain Pain Assessment: 0-10 Pain Score: 4  Pain Location: RUE and RLE Pain Descriptors / Indicators:  Aching;Grimacing;Guarding;Sore Pain Intervention(s): Limited activity within patient's tolerance;Premedicated before session;Repositioned    Home Living Family/patient expects to be discharged to:: Private residence Living Arrangements: Children Available Help at Discharge: Family Type of Home: House Home Access: Stairs to enter Entrance Stairs-Rails: Right Entrance Stairs-Number of Steps: 4 Home Layout: One level Home Equipment: Clinical cytogeneticist - 4 wheels;Cane - single point      Prior Function Level of Independence: Independent               Hand Dominance   Dominant Hand: Right    Extremity/Trunk Assessment   Upper Extremity Assessment Upper Extremity Assessment: Defer to OT evaluation    Lower Extremity Assessment Lower Extremity Assessment: RLE deficits/detail;Generalized weakness RLE Deficits / Details: limited by pain    Cervical / Trunk Assessment Cervical / Trunk Assessment: Normal  Communication   Communication: No difficulties  Cognition Arousal/Alertness: Awake/alert Behavior During Therapy: WFL for tasks assessed/performed Overall Cognitive Status: Within Functional Limits for tasks assessed                                        General Comments General comments (skin integrity, edema, etc.): VSS on RA  Exercises General Exercises - Lower Extremity Ankle Circles/Pumps: AROM;Both;10 reps;Supine Quad Sets: AROM;Both;5 reps;Supine Long Arc Quad: AROM;15 reps;Both;Seated    Assessment/Plan    PT Assessment Patient needs continued PT services  PT Problem List Decreased strength;Decreased range of motion;Decreased activity tolerance;Decreased balance;Decreased mobility;Pain       PT Treatment Interventions DME instruction;Gait training;Functional mobility training;Therapeutic activities;Therapeutic exercise;Balance training;Patient/family education    PT Goals (Current goals can be found in the Care Plan section)  Acute  Rehab PT Goals Patient Stated Goal: return home PT Goal Formulation: With patient Time For Goal Achievement: 04/01/21 Potential to Achieve Goals: Good    Frequency Min 3X/week   Barriers to discharge        Co-evaluation               AM-PAC PT "6 Clicks" Mobility  Outcome Measure Help needed turning from your back to your side while in a flat bed without using bedrails?: A Lot Help needed moving from lying on your back to sitting on the side of a flat bed without using bedrails?: A Lot Help needed moving to and from a bed to a chair (including a wheelchair)?: Total Help needed standing up from a chair using your arms (e.g., wheelchair or bedside chair)?: Total Help needed to walk in hospital room?: Total Help needed climbing 3-5 steps with a railing? : Total 6 Click Score: 8    End of Session   Activity Tolerance: Patient limited by pain Patient left: in bed;with call bell/phone within reach;with family/visitor present;Other (comment) (OT present) Nurse Communication: Mobility status PT Visit Diagnosis: Other abnormalities of gait and mobility (R26.89);Pain Pain - Right/Left: Right Pain - part of body: Shoulder;Hip    Time: 4034-7425 PT Time Calculation (min) (ACUTE ONLY): 27 min   Charges:   PT Evaluation $PT Eval Moderate Complexity: 1 Mod PT Treatments $Therapeutic Activity: 8-22 mins        [place] Pager [phone] Office [place] 03/18/2021, 3:26 PM

## 2021-03-18 NOTE — Progress Notes (Signed)
Trauma/Critical Care Follow Up Note  Subjective:    Overnight Issues:   Objective:  Vital signs for last 24 hours: Temp:  [98.2 F (36.8 C)-98.7 F (37.1 C)] 98.4 F (36.9 C) (07/07 0737) Pulse Rate:  [45-83] 80 (07/07 0737) Resp:  [12-27] 19 (07/07 0737) BP: (91-159)/(33-57) 159/47 (07/07 0737) SpO2:  [92 %-100 %] 99 % (07/07 0737) Weight:  [79.8 kg] 79.8 kg (07/07 0606)  Hemodynamic parameters for last 24 hours:    Intake/Output from previous day: 07/06 0701 - 07/07 0700 In: 1927.6 [I.V.:1427.6; IV Piggyback:500] Out: -   Intake/Output this shift: No intake/output data recorded.  Vent settings for last 24 hours:    Physical Exam:  Gen: comfortable, no distress Neuro: non-focal exam HEENT: PERRL Neck: supple CV: RRR Pulm: unlabored breathing Abd: soft, NT GU: clear yellow urine Extr: wwp, no edema   Results for orders placed or performed during the hospital encounter of 03/16/21 (from the past 24 hour(s))  Urinalysis, Routine w reflex microscopic Urine, Random     Status: Abnormal   Collection Time: 03/17/21  8:05 PM  Result Value Ref Range   Color, Urine YELLOW YELLOW   APPearance CLEAR CLEAR   Specific Gravity, Urine >1.046 (H) 1.005 - 1.030   pH 5.0 5.0 - 8.0   Glucose, UA NEGATIVE NEGATIVE mg/dL   Hgb urine dipstick NEGATIVE NEGATIVE   Bilirubin Urine NEGATIVE NEGATIVE   Ketones, ur NEGATIVE NEGATIVE mg/dL   Protein, ur NEGATIVE NEGATIVE mg/dL   Nitrite NEGATIVE NEGATIVE   Leukocytes,Ua LARGE (A) NEGATIVE   RBC / HPF 11-20 0 - 5 RBC/hpf   WBC, UA 21-50 0 - 5 WBC/hpf   Bacteria, UA RARE (A) NONE SEEN   Squamous Epithelial / LPF 0-5 0 - 5  CBC     Status: Abnormal   Collection Time: 03/18/21 12:30 AM  Result Value Ref Range   WBC 10.6 (H) 4.0 - 10.5 K/uL   RBC 3.32 (L) 3.87 - 5.11 MIL/uL   Hemoglobin 10.6 (L) 12.0 - 15.0 g/dL   HCT 32.9 (L) 36.0 - 46.0 %   MCV 99.1 80.0 - 100.0 fL   MCH 31.9 26.0 - 34.0 pg   MCHC 32.2 30.0 - 36.0 g/dL    RDW 13.4 11.5 - 15.5 %   Platelets PLATELET CLUMPS NOTED ON SMEAR, UNABLE TO ESTIMATE 150 - 400 K/uL   nRBC 0.0 0.0 - 0.2 %  Basic metabolic panel     Status: Abnormal   Collection Time: 03/18/21  7:11 AM  Result Value Ref Range   Sodium 135 135 - 145 mmol/L   Potassium 4.4 3.5 - 5.1 mmol/L   Chloride 100 98 - 111 mmol/L   CO2 29 22 - 32 mmol/L   Glucose, Bld 111 (H) 70 - 99 mg/dL   BUN 17 8 - 23 mg/dL   Creatinine, Ser 0.79 0.44 - 1.00 mg/dL   Calcium 8.7 (L) 8.9 - 10.3 mg/dL   GFR, Estimated >60 >60 mL/min   Anion gap 6 5 - 15  CBC     Status: Abnormal   Collection Time: 03/18/21  7:11 AM  Result Value Ref Range   WBC 8.4 4.0 - 10.5 K/uL   RBC 3.17 (L) 3.87 - 5.11 MIL/uL   Hemoglobin 10.3 (L) 12.0 - 15.0 g/dL   HCT 31.4 (L) 36.0 - 46.0 %   MCV 99.1 80.0 - 100.0 fL   MCH 32.5 26.0 - 34.0 pg   MCHC 32.8 30.0 -  36.0 g/dL   RDW 13.5 11.5 - 15.5 %   Platelets 156 150 - 400 K/uL   nRBC 0.0 0.0 - 0.2 %  VITAMIN D 25 Hydroxy (Vit-D Deficiency, Fractures)     Status: None   Collection Time: 03/18/21  7:11 AM  Result Value Ref Range   Vit D, 25-Hydroxy 36.09 30 - 100 ng/mL    Assessment & Plan:  Present on Admission:  Closed fracture of proximal end of right humerus, unspecified fracture morphology, initial encounter  Multiple pelvic fractures (HCC)  Chronic systolic CHF (congestive heart failure) (HCC)  Depression  HYPERTENSION, BENIGN SYSTEMIC    LOS: 2 days   Additional comments:I reviewed the patient's new clinical lab test results.   and I reviewed the patients new imaging test results.    GLF   R sacral ala fx extending into SI joint and R superior and inferior pubic rami fx with associated pelvic hematoma - ortho c/s, nonop, recheck CBC this PM. Pain regimen adjusted-sch tylenol, robaxin, ibuprofen. PRN oxy R humerus fx - ortho c/s, nonop FEN - okay for regular diet, patient requesting soft DVT - SCDs, rec initiation of 30mg  BID when hgb confirmed to be stable,  likely 7/8 Dispo - medsurg, PT/OT  Jesusita Oka, MD Trauma & General Surgery Please use [email or web address] to contact on call provider  03/18/2021  *Care during the described time interval was provided by me. I have reviewed this patient's available data, including medical history, events of note, physical examination and test results as part of my evaluation.

## 2021-03-18 NOTE — Plan of Care (Signed)
  Problem: Education: Goal: Knowledge of General Education information will improve Description Including pain rating scale, medication(s)/side effects and non-pharmacologic comfort measures Outcome: Progressing   

## 2021-03-18 NOTE — Progress Notes (Signed)
Patient has had a 2 g drop in her hgb from am labs at 0700.  Given she has a pelvic hematoma from her fractures with a moderate drop in her hgb as well as some increasing hypotension, will consult IR for consideration of embolization.  [name] 3:54 PM 03/18/2021

## 2021-03-18 NOTE — Evaluation (Signed)
Occupational Therapy Evaluation Patient Details Name: Terri Liu MRN: [id number] DOB: 02/05/1934 Today's Date: 03/18/2021    History of Present Illness Pt adm 7/5 after fall causing rt humeral head/neck  fx and sacral ala and superior and inferior pubic rami fx on the rt. PMH - HTN, post herpetic neuralgia, arthritis, lt femur fx, rt TKR, lt TKR, covid   Clinical Impression   Pt admitted for concerns listed above. PTA pt and her family reported that she was independent with all ADL's and functional mobility, and her family assisted with IADL's as needed. Additionally, pt reports that she only used a cane occasionally. At the time of the evaluation, pt was limited by pain, unable to come to standing, however was able to complete bed mobility, ADL assessments, and limited ROM of elbow, wrist, and hand. Pt was also educated on compensatory techniques of dressing and bathing, while limiting RUE movement. Pt will benefit from skilled intensive therapies to maximize her recovery and acute OT will follow to address concerns listed below.    Follow Up Recommendations  SNF;Supervision/Assistance - 24 hour    Equipment Recommendations  Other (comment) (TBD)    Recommendations for Other Services       Precautions / Restrictions Precautions Precautions: Fall Precaution Comments: Following total shoulder precautions until orders are clarified Required Braces or Orthoses: Sling Restrictions Weight Bearing Restrictions: Yes RUE Weight Bearing: Non weight bearing RLE Weight Bearing: Weight bearing as tolerated      Mobility Bed Mobility Overal bed mobility: Needs Assistance Bed Mobility: Sit to Supine       Sit to supine: Mod assist   General bed mobility comments: Mod A to support BLE and bring them on to the bed.    Transfers                 General transfer comment: Pain level too high this session to transfer OOB    Balance Overall balance assessment: Mild deficits  observed, not formally tested                                         ADL either performed or assessed with clinical judgement   ADL Overall ADL's : Needs assistance/impaired Eating/Feeding: Independent;Sitting   Grooming: Set up;Sitting   Upper Body Bathing: Minimal assistance;Sitting   Lower Body Bathing: Maximal assistance;Sitting/lateral leans   Upper Body Dressing : Minimal assistance;Sitting   Lower Body Dressing: Maximal assistance;Sitting/lateral leans   Toilet Transfer: Maximal assistance;+2 for safety/equipment;+2 for physical assistance;Squat-pivot   Toileting- Clothing Manipulation and Hygiene: Maximal assistance;Sitting/lateral lean   Tub/ Shower Transfer: Maximal assistance;+2 for physical assistance;+2 for safety/equipment;Squat-pivot     General ADL Comments: Pt is at an overall min-max A level.     Vision Baseline Vision/History: Wears glasses Wears Glasses: At all times Patient Visual Report: No change from baseline Vision Assessment?: No apparent visual deficits     Perception Perception Perception Tested?: No   Praxis Praxis Praxis tested?: Not tested    Pertinent Vitals/Pain Pain Assessment: 0-10 Pain Score: 5  Pain Location: RUE and RLE Pain Descriptors / Indicators: Aching;Grimacing;Guarding;Sore Pain Intervention(s): Limited activity within patient's tolerance;Monitored during session;Repositioned;RN gave pain meds during session     Hand Dominance Right   Extremity/Trunk Assessment Upper Extremity Assessment Upper Extremity Assessment: RUE deficits/detail;LUE deficits/detail RUE Deficits / Details: Fractured humerus head - only moving at elbows, wrist, and hand RUE  Sensation: WNL RUE Coordination: decreased gross motor LUE Deficits / Details: Unable to flex shoulder past 80degrees, abduct shoulder past 70 degrees, or extend shoulder to put her hand behind her back. Elbow, wrist, and hand ROM intact LUE Sensation:  WNL LUE Coordination: decreased gross motor   Lower Extremity Assessment Lower Extremity Assessment: Defer to PT evaluation   Cervical / Trunk Assessment Cervical / Trunk Assessment: Normal   Communication Communication Communication: No difficulties   Cognition Arousal/Alertness: Awake/alert Behavior During Therapy: WFL for tasks assessed/performed Overall Cognitive Status: Within Functional Limits for tasks assessed                                     General Comments  VSS on RA    Exercises Exercises: Shoulder Shoulder Exercises Elbow Flexion: 10 reps;AAROM Elbow Extension: 10 reps;AAROM Wrist Flexion: AAROM;10 reps Wrist Extension: AAROM;10 reps Digit Composite Flexion: AROM;10 reps   Shoulder Instructions Shoulder Instructions Donning/doffing sling/immobilizer: Patient able to independently direct caregiver;Moderate assistance Correct positioning of sling/immobilizer: Minimal assistance;Patient able to independently direct caregiver ROM for elbow, wrist and digits of operated UE: Min-guard    Home Living Family/patient expects to be discharged to:: Private residence Living Arrangements: Children Available Help at Discharge: Family Type of Home: House Home Access: Stairs to enter Technical brewer of Steps: 4 Entrance Stairs-Rails: Right Home Layout: One level     Bathroom Shower/Tub: Occupational psychologist: Handicapped height Bathroom Accessibility: Yes How Accessible: Accessible via [place]: Clinical cytogeneticist - 4 wheels;Cane - single point          Prior Functioning/Environment Level of Independence: Independent                 OT Problem List: Decreased strength;Decreased range of motion;Decreased activity tolerance;Impaired balance (sitting and/or standing);Decreased coordination;Decreased safety awareness;Decreased knowledge of use of DME or AE;Impaired UE functional use;Pain      OT  Treatment/Interventions: Self-care/ADL training;Therapeutic exercise;Energy conservation;DME and/or AE instruction;Therapeutic activities;Patient/family education;Balance training    OT Goals(Current goals can be found in the care plan section) Acute Rehab OT Goals Patient Stated Goal: To lessen pain OT Goal Formulation: With patient Time For Goal Achievement: 04/01/21 Potential to Achieve Goals: Good ADL Goals Pt Will Perform Grooming: with min assist;standing Pt Will Perform Upper Body Dressing: with supervision;with adaptive equipment;sitting Pt Will Perform Lower Body Dressing: with min assist;with adaptive equipment;sitting/lateral leans;sit to/from stand Pt Will Transfer to Toilet: with mod assist;stand pivot transfer Pt Will Perform Toileting - Clothing Manipulation and hygiene: with min assist;with adaptive equipment;sitting/lateral leans;sit to/from stand  OT Frequency: Min 2X/week   Barriers to D/C:            Co-evaluation              AM-PAC OT "6 Clicks" Daily Activity     Outcome Measure Help from another person eating meals?: None Help from another person taking care of personal grooming?: A Little Help from another person toileting, which includes using toliet, bedpan, or urinal?: A Lot Help from another person bathing (including washing, rinsing, drying)?: A Lot Help from another person to put on and taking off regular upper body clothing?: A Little Help from another person to put on and taking off regular lower body clothing?: A Lot 6 Click Score: 16   End of Session Nurse Communication: Mobility status;Patient requests pain meds  Activity Tolerance: Patient tolerated treatment well Patient  left: in bed;with call bell/phone within reach;with family/visitor present  OT Visit Diagnosis: Unsteadiness on feet (R26.81);Other abnormalities of gait and mobility (R26.89);Muscle weakness (generalized) (M62.81)                Time: 6811-5726 OT Time Calculation  (min): 38 min Charges:  OT General Charges $OT Visit: 1 Visit OT Evaluation $OT Eval Moderate Complexity: 1 Mod OT Treatments $Self Care/Home Management : 8-22 mins $Therapeutic Activity: 8-22 mins  Terri Niblack H., OTR/L Acute Rehabilitation  Terri Liu 03/18/2021, 2:33 PM

## 2021-03-18 NOTE — Plan of Care (Signed)

## 2021-03-18 NOTE — Progress Notes (Signed)
PROGRESS NOTE   Terri Liu  JEH:[id number] DOB: [birth year] DOA: 03/16/2021 PCP: Leeroy Cha, MD  Brief Narrative:  87 year old white female HTN, postherpetic neuralgia, HLD, left hip fracture 01/2017 COVID-pneumonia 09/14/2020 status post remdesivir, steroids HFpEF/HFrEF-Echo 40-45% 12/31/2019 + chronic LBBB  Status post Baker's cyst fluid drainage 03/16/2021-left knee subsequently gave out patient fell onto her right side-hit right face-severe pain right arm  In emergency room trauma work-up revealed fracture right humeral head and neck sacral ala and superior inferior pubic rami on the right  Orthopedics and trauma were consulted-patient deemed her nonoperative candidate  Hospital-Problem based course  Accidental fall-polytrauma being followed by trauma service Right humerus head fracture, sacral ala fracture are non-operable/ orthopedics first choice ibu 600 qid, 2nd choice oxyir 5-10 q4prn Needs shoulder/pelvis x-rays 4 weeks Dr. Lucia Gaskins office In the outpatient setting requires DEXA scan--check one 25-hydroxy vitamin D and increase vitamin D dosing if low Hold aspirin until discharge Iliopsoas hematomas with bladder displacement SCDs for now per trauma Lovenox as per them SCDs at this time HFpEF/HFrEF EF 40%-compensated no acute component Resume metoprolol 25 twice daily, losartan from 25 daily Leukocytosis on admission Likely 2/2 stress of fracture-antibiotics not indicated at this time Normocytic anemia ?  B12 deficiency in addition-continue B12 supplements, continue prenatal multivitamins Depression Continue Trileptal 150 twice daily Hyperglycemia not otherwise specified CBGs ranging 1 11-1 64 This correlates to an A1c of probably less than 6.5 If above 180 , then OP discussion hypogly agents   DVT prophylaxis: Lovenox may be from 7/7 SCD at this time Code Status:  DNR confirmed at the bedside Family Communication:  Disposition:  Status is:  Inpatient  Remains inpatient appropriate because:Persistent severe electrolyte disturbances, Ongoing active pain requiring inpatient pain management, and IV treatments appropriate due to intensity of illness or inability to take PO  Dispo: The patient is from: Home              Anticipated d/c is to: SNF              Patient currently is not medically stable to d/c.   Difficult to place patient No   Consultants:  Trauma Dr. Bobbye Morton [facility]opedics Dr. Lucia Gaskins  Procedures: Multiple CT scan  Antimicrobials: None at this time   Subjective:   emotional about loss of her son recently and her husband in addition Pain is rated at 5 on 10-6 on 10 with no movement and this can nontender with movement Therapy input is still pending She has no fever no chills No nausea no vomiting she is not eating a whole lot  Objective: Vitals:   03/18/21 0102 03/18/21 0224 03/18/21 0606 03/18/21 0737  BP: (!) 104/33 (!) 111/44 (!) 106/57 (!) 159/47  Pulse: 70 74 80 80  Resp: 15  16 19   Temp: 98.2 F (36.8 C)  98.5 F (36.9 C) 98.4 F (36.9 C)  TempSrc: Oral  Oral Oral  SpO2: 100%  100% 99%  Weight:   79.8 kg   Height:        Intake/Output Summary (Last 24 hours) at 03/18/2021 1042 Last data filed at 03/18/2021 0604 Gross per 24 hour  Intake 1927.55 ml  Output --  Net 1927.55 ml   Filed Weights   03/16/21 2152 03/18/21 0606  Weight: 80.7 kg 79.8 kg    Examination:  Coherent no distress--somewhat emotional EOMI NCAT no focal deficit Restriction to ROM right side and left lower extremity she is guarding her right arm Abdomen is soft  nontender no rebound no guarding Neurologically intact no focal deficit moving fingers and toes right with restriction of pain  Data Reviewed: personally reviewed   CBC    Component Value Date/Time   WBC 8.4 03/18/2021 0711   RBC 3.17 (L) 03/18/2021 0711   HGB 10.3 (L) 03/18/2021 0711   HGB 13.5 08/15/2016 1253   HCT 31.4 (L) 03/18/2021 0711   HCT 39.6  08/15/2016 1253   PLT 156 03/18/2021 0711   PLT 309 08/15/2016 1253   MCV 99.1 03/18/2021 0711   MCV 94 08/15/2016 1253   MCH 32.5 03/18/2021 0711   MCHC 32.8 03/18/2021 0711   RDW 13.5 03/18/2021 0711   RDW 13.6 08/15/2016 1253   LYMPHSABS 1.6 03/16/2021 2203   LYMPHSABS 1.4 08/15/2016 1253   MONOABS 1.1 (H) 03/16/2021 2203   EOSABS 0.1 03/16/2021 2203   EOSABS 0.1 08/15/2016 1253   BASOSABS 0.0 03/16/2021 2203   BASOSABS 0.0 08/15/2016 1253   CMP Latest Ref Rng & Units 03/18/2021 03/17/2021 03/16/2021  Glucose 70 - 99 mg/dL 111(H) 165(H) 115(H)  BUN 8 - 23 mg/dL 17 15 15   Creatinine 0.44 - 1.00 mg/dL 0.79 0.60 0.57  Sodium 135 - 145 mmol/L 135 135 133(L)  Potassium 3.5 - 5.1 mmol/L 4.4 4.4 3.6  Chloride 98 - 111 mmol/L 100 99 97(L)  CO2 22 - 32 mmol/L 29 29 30   Calcium 8.9 - 10.3 mg/dL 8.7(L) 9.0 9.0  Total Protein 6.5 - 8.1 g/dL - - -  Total Bilirubin 0.3 - 1.2 mg/dL - - -  Alkaline Phos 38 - 126 U/L - - -  AST 15 - 41 U/L - - -  ALT 0 - 44 U/L - - -     Radiology Studies: Reviewed on admission Scheduled Meds:  acetaminophen  1,000 mg Oral Q6H   cholecalciferol  1,000 Units Oral Daily   ibuprofen  600 mg Oral QID   losartan  50 mg Oral Daily   methocarbamol  1,000 mg Oral Q8H   metoprolol tartrate  25 mg Oral BID   multivitamin with minerals  1 tablet Oral Daily   OXcarbazepine  150 mg Oral BID   pravastatin  40 mg Oral q1800   vitamin B-12  100 mcg Oral Daily   Continuous Infusions:  lactated ringers 75 mL/hr at 03/18/21 0604     LOS: 2 days   Time spent: [place], MD [facility] To contact the attending provider between 7A-7P or the covering provider during after hours 7P-7A, please log into the web site [email or web address] and access using universal [place] password for that web site. If you do not have the password, please call the hospital operator.  03/18/2021, 10:42 AM

## 2021-03-18 NOTE — Progress Notes (Signed)
Bladder scan 315 mL urine. (Highest of 3 scans)

## 2021-03-18 NOTE — Progress Notes (Signed)
Interventional Radiology Progress Note  87 yo female admitted for trauma, pelvic fracture.   Repeat noncontrast CT performed as diagnostic study to evaluate a perceived drop in H&H by lab trend.   CT shows no evidence of expansion of the pelvic hematoma.  Presumed venous source based on prior and current CT. No CT evidence to account for the drop in H&H.   Empiric embolization not indicated at this time.    VIR will follow.   Signed,  Dulcy Fanny. Earleen Newport, DO

## 2021-03-18 NOTE — Progress Notes (Signed)
IR was requested for image guided pelvic arteriogram and possible embolization.  Patient is currently hospitalized due right humeral head and neck fracture and a displaced fracture of right superior and inferior pubic rami after a fall on 03/16/2021.   Patient underwent CT chest abdomen pelvis with contrast on 03/17/2021 which showed hematomas in the iliopsoas and obturator regions.  Patient was hemodynamically stable, no emergent intervention was indicated.  Patient has been followed by repeated H&H, which was stable till today afternoon when Hgb dropped from 10.3 to 8.5, HCT dropped from 31.4 to 26.   Her blood pressure also went down from 159/47 to 97/45.  IR was requested for pelvic arteriogram and possible embolization. Patient was evaluated with Dr. Earleen Newport at the bedside.  Patient laying in the bed, not in acute distress.  Daughter at the bedside. Complains of right arm pain, and not able to urinate much.   Physical Exam Patient alert and oriented, not in acute distress. Negative right JVD Distal pulses all intact, palpable bilateral radial, femoral, popliteal, and dorsalis pedis. Mild TTP on right lower quadrant, no rebound tenderness. Light pressure to pelvic bone recreates the right pelvic pain.  Swelling noted on right humerus, pt in a sling.     Patient appears to be hemodynamically stable during assessment, does not show signs and symptoms of acute arterial bleeding. The risk of arteriogram and possible embolization outweighs the benefit at this time. Following recommendations were made: Repeat CT abdomen pelvis without contrast to evaluate hematoma Bedrest with bathroom privileges until tomorrow morning Bladder scan to evaluate patient not able to urinate Continue H&H and repeat vitals  Repeat vitals at 1705 hrs. Showed: BP 106/62 HR 67 RR 16 O2 sat 92 at room air  Will review repeat CT abdomen pelvis when is done. No urgent IR intervention at this point.  Please call IR  for questions and concerns.  Will reevaluate patient tomorrow.   Armando Gang Evora Schechter PA-C 03/18/2021 5:10 PM

## 2021-03-19 LAB — CBC
HCT: 26.4 % — ABNORMAL LOW (ref 36.0–46.0)
Hemoglobin: 8.7 g/dL — ABNORMAL LOW (ref 12.0–15.0)
MCH: 32.6 pg (ref 26.0–34.0)
MCHC: 33 g/dL (ref 30.0–36.0)
MCV: 98.9 fL (ref 80.0–100.0)
Platelets: 156 10*3/uL (ref 150–400)
RBC: 2.67 MIL/uL — ABNORMAL LOW (ref 3.87–5.11)
RDW: 13.4 % (ref 11.5–15.5)
WBC: 8.8 10*3/uL (ref 4.0–10.5)
nRBC: 0 % (ref 0.0–0.2)

## 2021-03-19 LAB — BASIC METABOLIC PANEL
Anion gap: 5 (ref 5–15)
BUN: 15 mg/dL (ref 8–23)
CO2: 26 mmol/L (ref 22–32)
Calcium: 8.5 mg/dL — ABNORMAL LOW (ref 8.9–10.3)
Chloride: 99 mmol/L (ref 98–111)
Creatinine, Ser: 0.68 mg/dL (ref 0.44–1.00)
GFR, Estimated: >60 mL/min (ref >60)
Glucose, Bld: 109 mg/dL — ABNORMAL HIGH (ref 70–99)
Potassium: 4.7 mmol/L (ref 3.5–5.1)
Sodium: 130 mmol/L — ABNORMAL LOW (ref 135–145)

## 2021-03-19 MED ORDER — IBUPROFEN 400 MG PO TABS
400.0000 mg | ORAL_TABLET | Freq: Three times a day (TID) | ORAL | Status: DC
  Administered 2021-03-19 – 2021-03-21 (×6): 400 mg via ORAL
  Filled 2021-03-19 (×6): qty 1

## 2021-03-19 MED ORDER — METOPROLOL TARTRATE 12.5 MG HALF TABLET
12.5000 mg | ORAL_TABLET | Freq: Two times a day (BID) | ORAL | Status: DC
  Administered 2021-03-19 – 2021-03-26 (×14): 12.5 mg via ORAL
  Filled 2021-03-19 (×14): qty 1

## 2021-03-19 NOTE — Progress Notes (Signed)
PROGRESS NOTE  Terri Liu  TGY:[id number] DOB: 1933/09/17 DOA: 03/16/2021 PCP: Leeroy Cha, MD  Brief Narrative:  87 year old white female HTN, postherpetic neuralgia, HLD, left hip fracture 01/2017 COVID-pneumonia 09/14/2020 status post remdesivir, steroids HFpEF/HFrEF-Echo 40-45% 12/31/2019 + chronic LBBB  Status post Baker's cyst fluid drainage 03/16/2021-left knee subsequently gave out patient fell onto her right side-hit right face-severe pain right arm  In emergency room trauma work-up revealed fracture right humeral head and neck sacral ala and superior inferior pubic rami on the right  Orthopedics and trauma were consulted-patient deemed her nonoperative candidate  Hospital-Problem based course  Accidental fall-polytrauma being followed by trauma service Right humerus head fracture, sacral ala fracture are non-operable/ orthopedics Ibuprofen 400 3 times daily,, 2nd choice oxy-ir 5-10 q4prn Needs shoulder/pelvis x-rays 4 weeks Dr. Lucia Gaskins office OP DEXA scan--25 hydroxy vitamin D low normal range Hold aspirin until discharge Iliopsoas hematomas with bladder displacement SCDs  per trauma--Lovenox ? HFpEF/HFrEF EF 40%-compensated no acute component Resume metoprolol but at 12.5 twice daily, losartan from 25 daily Leukocytosis on admission Likely 2/2 stress of fracture-antibiotics not indicated at this time Normocytic anemia ?  B12 deficiency in addition-continue B12 supplements, continue prenatal multivitamins Depression Continue Trileptal 150 twice daily Hyperglycemia not otherwise specified CBGs ranging  120 range This correlates to an A1c of probably less than 6.5 If above 180 , then OP discussion hypogly agents   DVT prophylaxis: SCD Code Status:  DNR confirmed at the bedside Family Communication:  Disposition:  Status is: Inpatient  Remains inpatient appropriate because:Persistent severe electrolyte disturbances, Ongoing active pain requiring inpatient pain  management, and IV treatments appropriate due to intensity of illness or inability to take PO  Dispo: The patient is from: Home              Anticipated d/c is to: SNF              Patient currently is not medically stable to d/c.   Difficult to place patient No   Consultants:  Trauma Dr. Bobbye Morton Orthopedics Dr. Lucia Gaskins  Procedures: Multiple CT scan  Antimicrobials: None at this time   Subjective:  No stool since admit but thinks she might go Pain is 5/10 Passing some urine but seems to have slowed in terms of the strain No chest pain Daughter at bedside  Objective: Vitals:   03/18/21 2033 03/19/21 0015 03/19/21 0522 03/19/21 0848  BP: (!) 94/57 (!) 113/44 (!) 139/43 (!) 113/37  Pulse: 67 71 85 75  Resp: 15 15 15 15   Temp: 98.5 F (36.9 C) 98.1 F (36.7 C) 98.6 F (37 C) 98.5 F (36.9 C)  TempSrc: Oral Oral Oral Oral  SpO2: 99% 98% 100% 94%  Weight:      Height:        Intake/Output Summary (Last 24 hours) at 03/19/2021 1217 Last data filed at 03/19/2021 0847 Gross per 24 hour  Intake 475 ml  Output 600 ml  Net -125 ml    Filed Weights   03/16/21 2152 03/18/21 0606  Weight: 80.7 kg 79.8 kg    Examination:  EOMI NCAT frail pleasant coherent wearing right arm sling currently abdomen soft no rebound no guarding K8-J6 holosystolic murmur no rales Difficulty raising right lower extremity left lower extremity no deficits ROM intact otherwise Smile symmetric No rash no edema  Data Reviewed: personally reviewed   CBC    Component Value Date/Time   WBC 8.8 03/19/2021 0051   RBC 2.67 (L) 03/19/2021 0051  HGB 8.7 (L) 03/19/2021 0051   HGB 13.5 08/15/2016 1253   HCT 26.4 (L) 03/19/2021 0051   HCT 39.6 08/15/2016 1253   PLT 156 03/19/2021 0051   PLT 309 08/15/2016 1253   MCV 98.9 03/19/2021 0051   MCV 94 08/15/2016 1253   MCH 32.6 03/19/2021 0051   MCHC 33.0 03/19/2021 0051   RDW 13.4 03/19/2021 0051   RDW 13.6 08/15/2016 1253   LYMPHSABS 1.6 03/16/2021  2203   LYMPHSABS 1.4 08/15/2016 1253   MONOABS 1.1 (H) 03/16/2021 2203   EOSABS 0.1 03/16/2021 2203   EOSABS 0.1 08/15/2016 1253   BASOSABS 0.0 03/16/2021 2203   BASOSABS 0.0 08/15/2016 1253   CMP Latest Ref Rng & Units 03/19/2021 03/18/2021 03/17/2021  Glucose 70 - 99 mg/dL 109(H) 111(H) 165(H)  BUN 8 - 23 mg/dL 15 17 15   Creatinine 0.44 - 1.00 mg/dL 0.68 0.79 0.60  Sodium 135 - 145 mmol/L 130(L) 135 135  Potassium 3.5 - 5.1 mmol/L 4.7 4.4 4.4  Chloride 98 - 111 mmol/L 99 100 99  CO2 22 - 32 mmol/L 26 29 29   Calcium 8.9 - 10.3 mg/dL 8.5(L) 8.7(L) 9.0  Total Protein 6.5 - 8.1 g/dL - - -  Total Bilirubin 0.3 - 1.2 mg/dL - - -  Alkaline Phos 38 - 126 U/L - - -  AST 15 - 41 U/L - - -  ALT 0 - 44 U/L - - -     Radiology Studies: Reviewed on admission Scheduled Meds:  acetaminophen  1,000 mg Oral Q6H   cholecalciferol  1,000 Units Oral Daily   escitalopram  10 mg Oral Daily   losartan  25 mg Oral Daily   methocarbamol  1,000 mg Oral Q8H   multivitamin with minerals  1 tablet Oral Daily   OXcarbazepine  150 mg Oral BID   polyethylene glycol  17 g Oral Daily   pravastatin  40 mg Oral q1800   vitamin B-12  100 mcg Oral Daily   Continuous Infusions:     LOS: 3 days   Time spent: 36  Nita Sells, MD [facility] To contact the attending provider between 7A-7P or the covering provider during after hours 7P-7A, please log into the web site [email or web address] and access using universal [place] [place] password for that web site. If you do not have the password, please call the hospital operator.  03/19/2021, 12:17 PM

## 2021-03-19 NOTE — Progress Notes (Signed)
Patient requested that oxygen [place] be removed due to discomfort. Patient oxygen saturation is currently 95% on room air so nasal canula has been removed per patient request. Vitals charted.

## 2021-03-19 NOTE — TOC Initial Note (Signed)
Transition of Care [facility]) - Initial/Assessment Note    Patient Details  Name: Terri Liu MRN: [id number] Date of Birth: January 05, 1934  [facility] CM/SW Contact:    Coralee Pesa, [place] Phone Number: 03/19/2021, 3:41 PM  Clinical Narrative:                 CSW met with pt and daughter Herbert Spires at bedside. They are both agreeable to SNF placement when ready to DC, and are requesting [facility], as pt has been before. They said no [facility]. They would also like [facility] to be considered. Pt has had 2 vaccines, but no booster, will need to determine with facility she picks if she needs one. She had covid in January, and was advised to wait to get the booster. They noted bed offers should be given to daughter Herbert Spires, or to son, Louie Casa, and they will discuss it with pt. CSW will faxout pt for bed offers.   Expected Discharge Plan: Skilled Nursing Facility Barriers to Discharge: Insurance Authorization, SNF Pending bed offer, Continued Medical Work up   Patient Goals and CMS Choice Patient states their goals for this hospitalization and ongoing recovery are:: Pt agreeable to SNF placement CMS Medicare.gov Compare Post Acute Care list provided to:: Patient Choice offered to / list presented to : Patient  Expected Discharge Plan and Services Expected Discharge Plan: [place] Choice: [place] arrangements for the past 2 months: Single Family Home                                      Prior Living Arrangements/Services Living arrangements for the past 2 months: Single Family Home Lives with:: Self Patient language and need for interpreter reviewed:: Yes Do you feel safe going back to the place where you live?: Yes      Need for Family Participation in Patient Care: Yes (Comment) Care giver support system in place?: Yes (comment)   Criminal Activity/Legal Involvement Pertinent to Current Situation/Hospitalization:  No - Comment as needed  Activities of Daily Living Home Assistive Devices/Equipment: Blood pressure cuff, Dentures (specify type), Cane (specify quad or straight), Walker (specify type), Other (Comment) (pulse oxymeter) ADL Screening (condition at time of admission) Patient's cognitive ability adequate to safely complete daily activities?: Yes Is the patient deaf or have difficulty hearing?: No Does the patient have difficulty seeing, even when wearing glasses/contacts?: No Does the patient have difficulty concentrating, remembering, or making decisions?: No Patient able to express need for assistance with ADLs?: Yes Does the patient have difficulty dressing or bathing?: Yes Independently performs ADLs?: No Communication: Needs assistance Is this a change from baseline?: Change from baseline, expected to last >3 days Dressing (OT): Needs assistance Is this a change from baseline?: Change from baseline, expected to last >3 days Grooming: Needs assistance Is this a change from baseline?: Change from baseline, expected to last >3 days Feeding: Needs assistance Is this a change from baseline?: Change from baseline, expected to last >3 days Bathing: Needs assistance Is this a change from baseline?: Change from baseline, expected to last >3 days Toileting: Needs assistance Is this a change from baseline?: Change from baseline, expected to last >3days In/Out Bed: Needs assistance Is this a change from baseline?: Change from baseline, expected to last >3 days Walks in Home: Needs assistance Is this a change from baseline?: Change  from baseline, expected to last >3 days Does the patient have difficulty walking or climbing stairs?: Yes Weakness of Legs: Right Weakness of Arms/Hands: Right  Permission Sought/Granted Permission sought to share information with : Family Supports Permission granted to share information with : Yes, Verbal Permission Granted  Share Information with NAME: Meriam Sprague     Permission granted to share info w Relationship: Daughter  Permission granted to share info w Contact Information: [phone]  Emotional Assessment Appearance:: Appears stated age Attitude/Demeanor/Rapport: Engaged Affect (typically observed): Pleasant Orientation: : Oriented to Self, Oriented to Place, Oriented to  Time, Oriented to Situation Alcohol / Substance Use: Not Applicable Psych Involvement: No (comment)  Admission diagnosis:  Trauma [T14.90XA] Humeral head fracture, right, closed, initial encounter [S42.291A] Multiple closed fractures of pelvis without disruption of pelvic ring, initial encounter ([place]) [S32.82XA] Closed fracture of proximal end of right humerus, unspecified fracture morphology, initial encounter [S42.201A] Patient Active Problem List   Diagnosis Date Noted   Multiple pelvic fractures ([place]) 57/84/6962   Chronic systolic CHF (congestive heart failure) ([place]) 03/17/2021   Depression 03/17/2021   Humeral head fracture, right, closed, initial encounter    Leukopenia    Closed fracture of proximal end of right humerus, unspecified fracture morphology, initial encounter 03/16/2021   Pneumonia due to COVID-19 virus 09/12/2020   COVID-19 virus infection 09/11/2020   Generalized weakness 09/11/2020   COVID-19 09/11/2020   Generalized weakness 09/11/2020   Closed comminuted intertrochanteric fracture of left femur ([place]) 01/12/2017   Closed intertrochanteric fracture, left, initial encounter ([place]) 01/11/2017   Post herpetic neuralgia 08/15/2016   Hereditary and idiopathic peripheral neuropathy 08/15/2016   Primary osteoarthritis of left knee 12/17/2015   Left knee DJD 12/17/2015   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   Preretinal fibrosis, right eye 01/27/2015   Epiretinal membrane 01/27/2015   HYPERLIPIDEMIA 11/09/2006   OBESITY, NOS 11/09/2006   DEPRESSION, MAJOR, RECURRENT 11/09/2006   NEUROGENIC BLADDER 11/09/2006    HYPERTENSION, BENIGN SYSTEMIC 11/09/2006   ARTHRITIS 11/09/2006   PCP:  Leeroy Cha, MD Pharmacy:   [id number] - [place], [place] [address] [place] [address] Phone: [phone] Fax: [phone]     Social Determinants of Health (SDOH) Interventions    Readmission Risk Interventions No flowsheet data found.

## 2021-03-19 NOTE — Progress Notes (Signed)
[place] removed per pt requested. Oxygen sat on room air listed below.   03/19/21 1454  Oxygen Therapy  SpO2 95 %  O2 Device Room [employer]

## 2021-03-19 NOTE — Progress Notes (Signed)
Interventional Radiology Progress Note  87 yo female admitted for trauma, pelvic fracture, right humerus fracture.   Yesterday, there was a downward trend in her hemoglobin, and she had some soft BP measurements.  Concern for ongoing pelvic hemorrhage at site of pelvic fracture.    CT +C on admission shows fracture of right sacrum through SI joint, right superior and inferior pubic rami fracture.  Associated hematoma of the pelvis.    H&H trend: 13.2/39.7 > 11.7/35.2 > 10.6/32.9 > 10.3/31.4 > 8.5/26.0 > 8.9/26.5 > 8.7/26.4 Yesterday SBP 97-159, HR 67-80. Today SBP 113 - 139, HR 71-85  Noncon CT yesterday shows no increased pelvic hemorrhage that would account for the H&H drop.  No new hemorrhage.   Overnight she tells me that she has had no new symptoms. "I can move my right hip pretty well."    Abdomen is soft, nontender.  No new bruising.  Right arm in sling this am. She is moving her hips comfortably bilateral.   No incentive spirometer at the bedside.   No indication for angiogram at this time. Suggest that she is ok for continuing PT/OT today.    VIR available if any change. Discussed with Saverio Danker.   Signed,  Dulcy Fanny. Earleen Newport, DO

## 2021-03-19 NOTE — Progress Notes (Signed)
Trauma/Critical Care Follow Up Note  Subjective:    Patient voiding better this am.  Having some pain on right side of her pelvis, but overall better.  Eating well  Objective:  Vital signs for last 24 hours: Temp:  [98.1 F (36.7 C)-98.6 F (37 C)] 98.5 F (36.9 C) (07/08 0848) Pulse Rate:  [67-85] 75 (07/08 0848) Resp:  [15-16] 15 (07/08 0848) BP: (94-139)/(37-62) 113/37 (07/08 0848) SpO2:  [85 %-100 %] 94 % (07/08 0848)  Hemodynamic parameters for last 24 hours:    Intake/Output from previous day: 07/07 0701 - 07/08 0700 In: 480 [P.O.:480] Out: 600 [Urine:600]  Intake/Output this shift: Total I/O In: 175 [P.O.:175] Out: -   Vent settings for last 24 hours:    Physical Exam:  Gen: comfortable, no distress in bed Neuro: non-focal exam HEENT: PERRL Neck: supple CV: RRR Pulm: unlabored breathing Abd: soft, mildly tender in RLQ near ASIS GU: amber colored urine in cannister Extr: MAE    Results for orders placed or performed during the hospital encounter of 03/16/21 (from the past 24 hour(s))  CBC     Status: Abnormal   Collection Time: 03/18/21  2:11 PM  Result Value Ref Range   WBC 8.2 4.0 - 10.5 K/uL   RBC 2.66 (L) 3.87 - 5.11 MIL/uL   Hemoglobin 8.5 (L) 12.0 - 15.0 g/dL   HCT 26.0 (L) 36.0 - 46.0 %   MCV 97.7 80.0 - 100.0 fL   MCH 32.0 26.0 - 34.0 pg   MCHC 32.7 30.0 - 36.0 g/dL   RDW 13.4 11.5 - 15.5 %   Platelets 168 150 - 400 K/uL   nRBC 0.0 0.0 - 0.2 %  CBC     Status: Abnormal   Collection Time: 03/18/21  9:08 PM  Result Value Ref Range   WBC 8.0 4.0 - 10.5 K/uL   RBC 2.72 (L) 3.87 - 5.11 MIL/uL   Hemoglobin 8.9 (L) 12.0 - 15.0 g/dL   HCT 26.5 (L) 36.0 - 46.0 %   MCV 97.4 80.0 - 100.0 fL   MCH 32.7 26.0 - 34.0 pg   MCHC 33.6 30.0 - 36.0 g/dL   RDW 13.3 11.5 - 15.5 %   Platelets 148 (L) 150 - 400 K/uL   nRBC 0.0 0.0 - 0.2 %  Type and screen [place]     Status: None   Collection Time: 03/18/21  9:11 PM  Result Value  Ref Range   ABO/RH(D) A POS    Antibody Screen NEG    Sample Expiration      03/21/2021,2359 Performed at [facility], [address], [place],  [address]   Basic metabolic panel     Status: Abnormal   Collection Time: 03/19/21 12:51 AM  Result Value Ref Range   Sodium 130 (L) 135 - 145 mmol/L   Potassium 4.7 3.5 - 5.1 mmol/L   Chloride 99 98 - 111 mmol/L   CO2 26 22 - 32 mmol/L   Glucose, Bld 109 (H) 70 - 99 mg/dL   BUN 15 8 - 23 mg/dL   Creatinine, Ser 0.68 0.44 - 1.00 mg/dL   Calcium 8.5 (L) 8.9 - 10.3 mg/dL   GFR, Estimated >60 >60 mL/min   Anion gap 5 5 - 15  CBC     Status: Abnormal   Collection Time: 03/19/21 12:51 AM  Result Value Ref Range   WBC 8.8 4.0 - 10.5 K/uL   RBC 2.67 (L) 3.87 -  5.11 MIL/uL   Hemoglobin 8.7 (L) 12.0 - 15.0 g/dL   HCT 26.4 (L) 36.0 - 46.0 %   MCV 98.9 80.0 - 100.0 fL   MCH 32.6 26.0 - 34.0 pg   MCHC 33.0 30.0 - 36.0 g/dL   RDW 13.4 11.5 - 15.5 %   Platelets 156 150 - 400 K/uL   nRBC 0.0 0.0 - 0.2 %    Assessment & Plan:  Present on Admission:  Closed fracture of proximal end of right humerus, unspecified fracture morphology, initial encounter  Multiple pelvic fractures (HCC)  Chronic systolic CHF (congestive heart failure) (HCC)  Depression  HYPERTENSION, BENIGN SYSTEMIC    LOS: 3 days    GLF   R sacral ala fx extending into SI joint and R superior and inferior pubic rami fx with associated pelvic hematoma - ortho c/s, nonop, CBC drop 7/7, but now stable.  Repeat CT shows stability of hematoma.  No need for pelvic embo at this time.  Appreciate IR assistance.  May mobilize again today with therapies.  D/W Dr. Earleen Newport.  If hgb stable 7/9, may start lovenox at that time. R humerus fx - ortho c/s, nonop HTN: may restart beta blocker as BP allows.  D/W primary service FEN - okay for regular diet, patient requesting soft DVT - SCDs, rec initiation of 30mg  BID when hgb confirmed to be stable, likely 7/9 Dispo - SNF when  stable  [name], PA-C Trauma & General Surgery Please use [email or web address] to contact on call provider  03/19/2021

## 2021-03-19 NOTE — [place] FL2 (Signed)
[place] MEDICAID FL2 LEVEL OF CARE SCREENING TOOL     IDENTIFICATION  Patient Name: Terri Liu Birthdate: 04/28/1934 Sex: female Admission Date (Current Location): 03/16/2021  [facility] and [place] Number:  Herbalist and Address:  The [place], [place] [address], [place], [place] [address]      Provider Number: [id number]  Attending Physician Name and Address:  Nita Sells, MD  Relative Name and Phone Number:  Meriam Sprague, [phone]    Current Level of Care: Hospital Recommended Level of Care: [place] Prior Approval Number:    Date Approved/Denied:   PASRR Number: [id number]  Discharge Plan: SNF    Current Diagnoses: Patient Active Problem List   Diagnosis Date Noted   Multiple pelvic fractures ([place]) 35/46/5681   Chronic systolic CHF (congestive heart failure) ([place]) 03/17/2021   Depression 03/17/2021   Humeral head fracture, right, closed, initial encounter    Leukopenia    Closed fracture of proximal end of right humerus, unspecified fracture morphology, initial encounter 03/16/2021   Pneumonia due to COVID-19 virus 09/12/2020   COVID-19 virus infection 09/11/2020   Generalized weakness 09/11/2020   COVID-19 09/11/2020   Generalized weakness 09/11/2020   Closed comminuted intertrochanteric fracture of left femur ([place]) 01/12/2017   Closed intertrochanteric fracture, left, initial encounter ([place]) 01/11/2017   Post herpetic neuralgia 08/15/2016   Hereditary and idiopathic peripheral neuropathy 08/15/2016   Primary osteoarthritis of left knee 12/17/2015   Left knee DJD 12/17/2015   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   Preretinal fibrosis, right eye 01/27/2015   Epiretinal membrane 01/27/2015   HYPERLIPIDEMIA 11/09/2006   OBESITY, NOS 11/09/2006   DEPRESSION, MAJOR, RECURRENT 11/09/2006   NEUROGENIC BLADDER 11/09/2006   HYPERTENSION, BENIGN SYSTEMIC 11/09/2006   ARTHRITIS  11/09/2006    Orientation RESPIRATION BLADDER Height & Weight     Self, Time, Situation, Place  Normal Continent Weight: 175 lb 14.8 oz (79.8 kg) Height:  5\' 4"  (162.6 cm)  BEHAVIORAL SYMPTOMS/MOOD NEUROLOGICAL BOWEL NUTRITION STATUS      Continent Diet (See DC summary)  AMBULATORY STATUS COMMUNICATION OF NEEDS Skin   Extensive Assist Verbally Skin abrasions (R arm, leg, and hip ecchymosis)                       Personal Care Assistance Level of Assistance  Bathing, Feeding, Dressing Bathing Assistance: Limited assistance Feeding assistance: Independent Dressing Assistance: Limited assistance     Functional Limitations Info  Sight, Hearing, Speech Sight Info: Adequate Hearing Info: Adequate Speech Info: Adequate    SPECIAL CARE FACTORS FREQUENCY  PT (By licensed PT), OT (By licensed OT)     PT Frequency: 5x week OT Frequency: 5x week            Contractures      Additional Factors Info  Code Status, Allergies Code Status Info: DNR Allergies Info: Cymbalta (Duloxetine Hcl)   Lipitor (Atorvastatin)   Other   Amlodipine Besylate-valsartan   Codeine   Hydrocodone-acetaminophen   Tape           Current Medications (03/19/2021):  This is the current hospital active medication list Current Facility-Administered Medications  Medication Dose Route Frequency Provider Last Rate Last Admin   acetaminophen (TYLENOL) tablet 650 mg  650 mg Oral Q6H PRN Opyd, Ilene Qua, MD       Or   acetaminophen (TYLENOL) suppository 650 mg  650 mg Rectal Q6H PRN Opyd, Ilene Qua,  MD       acetaminophen (TYLENOL) tablet 1,000 mg  1,000 mg Oral Q6H Lovick, Montel Culver, MD   1,000 mg at 03/19/21 1125   bisacodyl (DULCOLAX) EC tablet 5 mg  5 mg Oral Daily PRN Opyd, Ilene Qua, MD       cholecalciferol (VITAMIN D3) tablet 1,000 Units  1,000 Units Oral Daily Nita Sells, MD   1,000 Units at 03/19/21 0952   escitalopram (LEXAPRO) tablet 10 mg  10 mg Oral Daily Nita Sells, MD    10 mg at 03/19/21 3244   ibuprofen (ADVIL) tablet 400 mg  400 mg Oral TID Nita Sells, MD   400 mg at 03/19/21 1538   losartan (COZAAR) tablet 25 mg  25 mg Oral Daily Nita Sells, MD   25 mg at 03/19/21 0102   methocarbamol (ROBAXIN) tablet 1,000 mg  1,000 mg Oral Q8H Lovick, Montel Culver, MD   1,000 mg at 03/19/21 1400   metoprolol tartrate (LOPRESSOR) tablet 12.5 mg  12.5 mg Oral BID Nita Sells, MD       multivitamin with minerals tablet 1 tablet  1 tablet Oral Daily Nita Sells, MD   1 tablet at 03/19/21 0952   ondansetron (ZOFRAN) tablet 4 mg  4 mg Oral Q6H PRN Opyd, Ilene Qua, MD       Or   ondansetron (ZOFRAN) injection 4 mg  4 mg Intravenous Q6H PRN Opyd, Ilene Qua, MD   4 mg at 03/17/21 0417   OXcarbazepine (TRILEPTAL) tablet 150 mg  150 mg Oral BID Nita Sells, MD   150 mg at 03/19/21 7253   oxyCODONE (ROXICODONE) 5 MG/5ML solution 5-10 mg  5-10 mg Oral Q4H PRN Jesusita Oka, MD   5 mg at 03/18/21 2116   polyethylene glycol (MIRALAX / GLYCOLAX) packet 17 g  17 g Oral Daily Nita Sells, MD   17 g at 03/19/21 6644   pravastatin (PRAVACHOL) tablet 40 mg  40 mg Oral q1800 Nita Sells, MD   40 mg at 03/18/21 1930   senna-docusate (Senokot-S) tablet 1 tablet  1 tablet Oral QHS PRN Opyd, Ilene Qua, MD       vitamin B-12 (CYANOCOBALAMIN) tablet 100 mcg  100 mcg Oral Daily Nita Sells, MD   100 mcg at 03/19/21 0347     Discharge Medications: Please see discharge summary for a list of discharge medications.  Relevant Imaging Results:  Relevant Lab Results:   Additional Information SS # [id number]  Coralee Pesa, LCSWA

## 2021-03-20 LAB — CBC
HCT: 25.7 % — ABNORMAL LOW (ref 36.0–46.0)
Hemoglobin: 8.8 g/dL — ABNORMAL LOW (ref 12.0–15.0)
MCH: 32.8 pg (ref 26.0–34.0)
MCHC: 34.2 g/dL (ref 30.0–36.0)
MCV: 95.9 fL (ref 80.0–100.0)
Platelets: 152 10*3/uL (ref 150–400)
RBC: 2.68 MIL/uL — ABNORMAL LOW (ref 3.87–5.11)
RDW: 13.2 % (ref 11.5–15.5)
WBC: 7.1 10*3/uL (ref 4.0–10.5)
nRBC: 0 % (ref 0.0–0.2)

## 2021-03-20 LAB — COMPREHENSIVE METABOLIC PANEL
ALT: 15 U/L (ref 0–44)
AST: 17 U/L (ref 15–41)
Albumin: 2.4 g/dL — ABNORMAL LOW (ref 3.5–5.0)
Alkaline Phosphatase: 34 U/L — ABNORMAL LOW (ref 38–126)
Anion gap: 4 — ABNORMAL LOW (ref 5–15)
BUN: 10 mg/dL (ref 8–23)
CO2: 30 mmol/L (ref 22–32)
Calcium: 8.5 mg/dL — ABNORMAL LOW (ref 8.9–10.3)
Chloride: 96 mmol/L — ABNORMAL LOW (ref 98–111)
Creatinine, Ser: 0.57 mg/dL (ref 0.44–1.00)
GFR, Estimated: >60 mL/min (ref >60)
Glucose, Bld: 111 mg/dL — ABNORMAL HIGH (ref 70–99)
Potassium: 4.6 mmol/L (ref 3.5–5.1)
Sodium: 130 mmol/L — ABNORMAL LOW (ref 135–145)
Total Bilirubin: 1.2 mg/dL (ref 0.3–1.2)
Total Protein: 4.6 g/dL — ABNORMAL LOW (ref 6.5–8.1)

## 2021-03-20 MED ORDER — SENNOSIDES-DOCUSATE SODIUM 8.6-50 MG PO TABS
1.0000 | ORAL_TABLET | Freq: Two times a day (BID) | ORAL | Status: DC
  Administered 2021-03-20 – 2021-03-21 (×3): 1 via ORAL
  Filled 2021-03-20 (×3): qty 1

## 2021-03-20 MED ORDER — OXYCODONE HCL 5 MG/5ML PO SOLN
5.0000 mg | ORAL | Status: DC | PRN
  Administered 2021-03-22 – 2021-03-25 (×2): 10 mg via ORAL
  Administered 2021-03-26 (×2): 5 mg via ORAL
  Filled 2021-03-20 (×2): qty 5
  Filled 2021-03-20: qty 10
  Filled 2021-03-20: qty 5
  Filled 2021-03-20: qty 10

## 2021-03-20 MED ORDER — ENOXAPARIN SODIUM 30 MG/0.3ML IJ SOSY: 30.0000 mg | PREFILLED_SYRINGE | INTRAMUSCULAR | Status: DC

## 2021-03-20 MED ORDER — ENOXAPARIN SODIUM 30 MG/0.3ML IJ SOSY
30.0000 mg | PREFILLED_SYRINGE | Freq: Two times a day (BID) | INTRAMUSCULAR | Status: DC
  Administered 2021-03-20 – 2021-03-26 (×12): 30 mg via SUBCUTANEOUS
  Filled 2021-03-20 (×12): qty 0.3

## 2021-03-20 NOTE — Progress Notes (Signed)
PROGRESS NOTE  Terri Liu  DJT:[id number] DOB: April 08, 1934 DOA: 03/16/2021 PCP: Leeroy Cha, MD  Brief Narrative:  87 year old white female HTN, postherpetic neuralgia, HLD, left hip fracture 01/2017 COVID-pneumonia 09/14/2020 status post remdesivir, steroids HFpEF/HFrEF-Echo 40-45% 12/31/2019 + chronic LBBB  Status post Baker's cyst fluid drainage 03/16/2021-left knee subsequently gave out patient fell onto her right side-hit right face-severe pain right arm  In emergency room trauma work-up revealed fracture right humeral head and neck sacral ala and superior inferior pubic rami on the right  Orthopedics and trauma were consulted-patient deemed her nonoperative candidate  Hospital-Problem based course  Accidental fall-polytrauma being followed by trauma service Right humerus hd fracture, sacral  fracture non-operable/ orthopedics Ibuprofen 400 3 times daily,2nd choice oxy-ir 5-10 managed to every 3 as needed given severe pain-watch mentation Needs shoulder/pelvis x-rays 4 weeks Dr. Lucia Gaskins office OP DEXA scan--25 hydroxy vitamin D low normal range Hold aspirin until discharge Iliopsoas hematomas with bladder displacement SCDs  per trauma--Lovenox resumed by surgery HFpEF/HFrEF EF 40%-compensated no acute component Resume metoprolol but at 12.5 twice daily, losartan from 25 daily Titrate metoprolol back to home dosing Leukocytosis on admission Likely 2/2 stress of fracture-antibiotics not indicated at this time Normocytic anemia ?  B12 deficiency in addition-continue B12 supplements, continue prenatal multivitamins Depression Continue Trileptal 150 twice daily Hyperglycemia not otherwise specified CBGs ranging 100 range range This correlates to an A1c of probably less than 6.5 If above 180 , then OP discussion hypogly agents   DVT prophylaxis: SCD Code Status:  DNR confirmed at the bedside Family Communication:  Disposition:  Status is: Inpatient  Remains inpatient  appropriate because:Persistent severe electrolyte disturbances, Ongoing active pain requiring inpatient pain management, and IV treatments appropriate due to intensity of illness or inability to take PO  Dispo: The patient is from: Home              Anticipated d/c is to: SNF              Patient currently is not medically stable to d/c.   Difficult to place patient No   Consultants:  Trauma Dr. Bobbye [facility] Dr. Lucia Gaskins  Procedures: Multiple CT scan  Antimicrobials: None at this time   Subjective:  Coherent in severe pain on movement of arm/leg on right side but manageable when she is not moving Has not had a stool so we adjusted her laxatives-she states that she was prune juice Her son and friend at the bedside Other than the pain she is stable and was sitting in the chair when I saw her  Objective: Vitals:   03/19/21 2029 03/20/21 0009 03/20/21 0619 03/20/21 0738  BP: (!) 140/45 (!) 137/48 (!) 183/66 (!) 147/50  Pulse: 77 71 82 76  Resp: 15 15 16 15   Temp: 98.4 F (36.9 C) 98.3 F (36.8 C) 98.2 F (36.8 C) 98 F (36.7 C)  TempSrc: Oral Oral Oral Axillary  SpO2: 96% 93% 96% 90%  Weight:      Height:        Intake/Output Summary (Last 24 hours) at 03/20/2021 1346 Last data filed at 03/20/2021 0700 Gross per 24 hour  Intake 660 ml  Output 600 ml  Net 60 ml    Filed Weights   03/16/21 2152 03/18/21 0606  Weight: 80.7 kg 79.8 kg    Examination:  Pleasant coherent wearing right triangular sling on right arm Chest clear no added sound no rales no rhonchi Z0-S9 holosystolic murmur Abdomen distended but nontender no rebound  Painful ROM to right lower extremity neurologically intact Psych euthymic  Data Reviewed: personally reviewed   CBC    Component Value Date/Time   WBC 7.1 03/20/2021 0056   RBC 2.68 (L) 03/20/2021 0056   HGB 8.8 (L) 03/20/2021 0056   HGB 13.5 08/15/2016 1253   HCT 25.7 (L) 03/20/2021 0056   HCT 39.6 08/15/2016 1253   PLT 152  03/20/2021 0056   PLT 309 08/15/2016 1253   MCV 95.9 03/20/2021 0056   MCV 94 08/15/2016 1253   MCH 32.8 03/20/2021 0056   MCHC 34.2 03/20/2021 0056   RDW 13.2 03/20/2021 0056   RDW 13.6 08/15/2016 1253   LYMPHSABS 1.6 03/16/2021 2203   LYMPHSABS 1.4 08/15/2016 1253   MONOABS 1.1 (H) 03/16/2021 2203   EOSABS 0.1 03/16/2021 2203   EOSABS 0.1 08/15/2016 1253   BASOSABS 0.0 03/16/2021 2203   BASOSABS 0.0 08/15/2016 1253   CMP Latest Ref Rng & Units 03/20/2021 03/19/2021 03/18/2021  Glucose 70 - 99 mg/dL 111(H) 109(H) 111(H)  BUN 8 - 23 mg/dL 10 15 17   Creatinine 0.44 - 1.00 mg/dL 0.57 0.68 0.79  Sodium 135 - 145 mmol/L 130(L) 130(L) 135  Potassium 3.5 - 5.1 mmol/L 4.6 4.7 4.4  Chloride 98 - 111 mmol/L 96(L) 99 100  CO2 22 - 32 mmol/L 30 26 29   Calcium 8.9 - 10.3 mg/dL 8.5(L) 8.5(L) 8.7(L)  Total Protein 6.5 - 8.1 g/dL 4.6(L) - -  Total Bilirubin 0.3 - 1.2 mg/dL 1.2 - -  Alkaline Phos 38 - 126 U/L 34(L) - -  AST 15 - 41 U/L 17 - -  ALT 0 - 44 U/L 15 - -     Radiology Studies: Reviewed on admission Scheduled Meds:  acetaminophen  1,000 mg Oral Q6H   cholecalciferol  1,000 Units Oral Daily   escitalopram  10 mg Oral Daily   ibuprofen  400 mg Oral TID   losartan  25 mg Oral Daily   methocarbamol  1,000 mg Oral Q8H   metoprolol tartrate  12.5 mg Oral BID   multivitamin with minerals  1 tablet Oral Daily   OXcarbazepine  150 mg Oral BID   polyethylene glycol  17 g Oral Daily   pravastatin  40 mg Oral q1800   senna-docusate  1 tablet Oral BID   vitamin B-12  100 mcg Oral Daily   Continuous Infusions:     LOS: 4 days   Time spent: [place], MD [facility] To contact the attending provider between 7A-7P or the covering provider during after hours 7P-7A, please log into the web site [email or web address] and access using universal [place] password for that web site. If you do not have the password, please call the hospital operator.  03/20/2021, 1:46 PM

## 2021-03-20 NOTE — Progress Notes (Signed)
Patient's hgb is stable this am.  Ok to start lovenox 30 mg BID.  Pelvic and humerus fractures will be managed by orthopedics.  Overall improvement in pelvic hematomas and reassuring by stable hgb.  No further trauma surgery needs.  We will sign off at this time.  [name] 8:02 AM 03/20/2021

## 2021-03-21 MED ORDER — SENNOSIDES-DOCUSATE SODIUM 8.6-50 MG PO TABS
1.0000 | ORAL_TABLET | Freq: Every day | ORAL | Status: DC
  Administered 2021-03-22 – 2021-03-25 (×4): 1 via ORAL
  Filled 2021-03-21 (×4): qty 1

## 2021-03-21 MED ORDER — PANTOPRAZOLE SODIUM 40 MG PO TBEC
40.0000 mg | DELAYED_RELEASE_TABLET | Freq: Every day | ORAL | Status: DC
  Administered 2021-03-21 – 2021-03-26 (×6): 40 mg via ORAL
  Filled 2021-03-21 (×6): qty 1

## 2021-03-21 NOTE — Plan of Care (Signed)

## 2021-03-21 NOTE — Progress Notes (Signed)
PROGRESS NOTE  Terri Liu  HMC:[id number] DOB: 06-May-1934 DOA: 03/16/2021 PCP: Leeroy Cha, MD  Brief Narrative:  87 year old white female HTN, postherpetic neuralgia, HLD, left hip fracture 01/2017 COVID-pneumonia 09/14/2020 status post remdesivir, steroids HFpEF/HFrEF-Echo 40-45% 12/31/2019 + chronic LBBB  Status post Baker's cyst fluid drainage 03/16/2021-left knee subsequently gave out patient fell onto her right side-hit right face-severe pain right arm  In emergency room trauma work-up revealed fracture right humeral head and neck sacral ala and superior inferior pubic rami on the right  Orthopedics and trauma were consulted-patient deemed her nonoperative candidate  Hospital-Problem based course  Accidental fall-polytrauma being followed by trauma service Right humerus hd fracture, sacral  # non-operable/ orthopedics Ibuprofen HELD [n, dyshpahgia] Oxy-ir 5-10 managed to every 3 as eded given severe pain-watch mentation Needs shoulder/pelvis x-rays 4 weeks Dr. Lucia Gaskins office OP DEXA scan--25 hydroxy vitamin D low normal range Hold aspirin until discharge Abd discomfort, nausea after receiving laxative Start protonix 40 qd Monitor symptoms--add back low dose NSAIDs in 24 h Iliopsoas hematomas with bladder displacement SCDs per trauma--Lovenox resumed by surgery HFpEF/HFrEF EF 40%-compensated no acute component Resume metoprolol @ 12.5 twice daily, losartan from 25 daily Titrate metoprolol back to home dosing after d/c Leukocytosis on admission Likely 2/2 stress of fracture-antibiotics not indicated at this time Normocytic anemia ?  B12 deficiency in addition-continue B12 supplements, continue prenatal multivitamins Depression Continue Trileptal 150 twice daily Hyperglycemia not otherwise specified CBGs ranging 100--165 range If above 180 , then OP discussion hypogly agents   DVT prophylaxis: SCD Code Status:  DNR confirmed at the bedside Family Communication:   Disposition:  Status is: Inpatient  Remains inpatient appropriate because:Persistent severe electrolyte disturbances, Ongoing active pain requiring inpatient pain management, and IV treatments appropriate due to intensity of illness or inability to take PO  Dispo: The patient is from: Home              Anticipated d/c is to: SNF              Patient currently is not medically stable to d/c.   Difficult to place patient No   Consultants:  Trauma Dr. Bobbye Morton [facility] Dr. Lucia Gaskins  Procedures: Multiple CT scan  Antimicrobials: None at this time   Subjective:  Abd discomfort, + N, 2 emesis Passing stool x 2--last one diarr Passing gas no cp  Objective: Vitals:   03/20/21 2041 03/21/21 0035 03/21/21 0520 03/21/21 0833  BP: (!) 153/56 (!) 149/61 (!) 154/61 (!) 150/55  Pulse: 79 74 75 80  Resp: 18 20 18 16   Temp: 98.9 F (37.2 C) 98.8 F (37.1 C) 97.8 F (36.6 C) 98.1 F (36.7 C)  TempSrc: Oral Oral Oral Oral  SpO2: 92% 95% 96% 93%  Weight:      Height:        Intake/Output Summary (Last 24 hours) at 03/21/2021 1750 Last data filed at 03/21/2021 1300 Gross per 24 hour  Intake 270 ml  Output 400 ml  Net -130 ml    Filed Weights   03/16/21 2152 03/18/21 0606  Weight: 80.7 kg 79.8 kg    Examination:  In some distress Eomi ncat And soft, nt No rebound slight distension S1 s2 no m R arm in sling RLE poor ROM 2/2 recent surgery  Data Reviewed: personally reviewed   CBC    Component Value Date/Time   WBC 7.1 03/20/2021 0056   RBC 2.68 (L) 03/20/2021 0056   HGB 8.8 (L) 03/20/2021 0056   HGB  13.5 08/15/2016 1253   HCT 25.7 (L) 03/20/2021 0056   HCT 39.6 08/15/2016 1253   PLT 152 03/20/2021 0056   PLT 309 08/15/2016 1253   MCV 95.9 03/20/2021 0056   MCV 94 08/15/2016 1253   MCH 32.8 03/20/2021 0056   MCHC 34.2 03/20/2021 0056   RDW 13.2 03/20/2021 0056   RDW 13.6 08/15/2016 1253   LYMPHSABS 1.6 03/16/2021 2203   LYMPHSABS 1.4 08/15/2016 1253    MONOABS 1.1 (H) 03/16/2021 2203   EOSABS 0.1 03/16/2021 2203   EOSABS 0.1 08/15/2016 1253   BASOSABS 0.0 03/16/2021 2203   BASOSABS 0.0 08/15/2016 1253   CMP Latest Ref Rng & Units 03/20/2021 03/19/2021 03/18/2021  Glucose 70 - 99 mg/dL 111(H) 109(H) 111(H)  BUN 8 - 23 mg/dL 10 15 17   Creatinine 0.44 - 1.00 mg/dL 0.57 0.68 0.79  Sodium 135 - 145 mmol/L 130(L) 130(L) 135  Potassium 3.5 - 5.1 mmol/L 4.6 4.7 4.4  Chloride 98 - 111 mmol/L 96(L) 99 100  CO2 22 - 32 mmol/L 30 26 29   Calcium 8.9 - 10.3 mg/dL 8.5(L) 8.5(L) 8.7(L)  Total Protein 6.5 - 8.1 g/dL 4.6(L) - -  Total Bilirubin 0.3 - 1.2 mg/dL 1.2 - -  Alkaline Phos 38 - 126 U/L 34(L) - -  AST 15 - 41 U/L 17 - -  ALT 0 - 44 U/L 15 - -     Radiology Studies: Reviewed on admission Scheduled Meds:  acetaminophen  1,000 mg Oral Q6H   cholecalciferol  1,000 Units Oral Daily   enoxaparin (LOVENOX) injection  30 mg Subcutaneous Q12H   escitalopram  10 mg Oral Daily   losartan  25 mg Oral Daily   methocarbamol  1,000 mg Oral Q8H   metoprolol tartrate  12.5 mg Oral BID   multivitamin with minerals  1 tablet Oral Daily   OXcarbazepine  150 mg Oral BID   pantoprazole  40 mg Oral Daily   polyethylene glycol  17 g Oral Daily   pravastatin  40 mg Oral q1800   [START ON 03/22/2021] senna-docusate  1 tablet Oral QHS   vitamin B-12  100 mcg Oral Daily   Continuous Infusions:     LOS: 5 days   Time spent: 69  Nita Sells, MD [facility] To contact the attending provider between 7A-7P or the covering provider during after hours 7P-7A, please log into the web site [email or web address] and access using universal [place] password for that web site. If you do not have the password, please call the hospital operator.  03/21/2021, 5:50 PM

## 2021-03-21 NOTE — Progress Notes (Signed)
Physical Therapy Treatment Patient Details Name: MARGUERITA STAPP MRN: [id number] DOB: 1934-01-15 Today's Date: 03/21/2021    History of Present Illness Pt adm 7/5 after fall causing rt humeral head/neck  fx and sacral ala and superior and inferior pubic rami fx on the rt. PMH - HTN, post herpetic neuralgia, arthritis, lt femur fx, rt TKR, lt TKR, covid    PT Comments    Focused session on advancing OOB mobility. Pt making good progress as she was able to stand and take several side steps to the L with modAx2 and L HHA this date. Her R lower extremity pain limits her ability to shift weight to the R to allow for longer L step lengths and foot clearance. Pt with intermittent R knee buckling, needing blocking, indicating quads weakness also. Performed seated marching and LAQs at end of session with verbal instruction to perform as HEP. Will continue to follow acutely. Current recommendations remain appropriate.   Follow Up Recommendations  SNF     Equipment Recommendations  Cane;Other (comment) (vs hemiwalker)    Recommendations for Other Services       Precautions / Restrictions Precautions Precautions: Fall Precaution Comments: Following total shoulder precautions until orders are clarified Required Braces or Orthoses: Sling Restrictions Weight Bearing Restrictions: Yes RUE Weight Bearing: Non weight bearing RLE Weight Bearing: Weight bearing as tolerated    Mobility  Bed Mobility Overal bed mobility: Needs Assistance Bed Mobility: Supine to Sit     Supine to sit: Min assist;HOB elevated     General bed mobility comments: Cues to manage legs towards EOB, minA to ascend trunk, cuing pt to place L elbow under trunk to push up. HOB elevated.    Transfers Overall transfer level: Needs assistance Equipment used: 1 person hand held assist Transfers: Sit to/from [facility] Sit to Stand: Mod assist;+2 physical assistance;+2 safety/equipment Stand pivot  transfers: Mod assist;+2 physical assistance;+2 safety/equipment       General transfer comment: Sit to stand from EOB with L HHA, modAx2 to power up to stand. ModAx2 to steady and direct buttocks towards L, intermittent R knee block, to stand step to L to transfer bed > recliner.  Ambulation/Gait Ambulation/Gait assistance: Mod assist;+2 physical assistance;+2 safety/equipment Gait Distance (Feet): 3 Feet Assistive device: 1 person hand held assist Gait Pattern/deviations: Step-to pattern;Decreased step length - left;Decreased stance time - right;Decreased weight shift to right;Antalgic Gait velocity: reduced Gait velocity interpretation: <1.31 ft/sec, indicative of household ambulator General Gait Details: Pt with antalgic gait pattern, displaying decreased R weight shift and stance time and thus decreasd L step length. intermittent R knee block for support to allow increased L step length. ModAx2 for stability and guidance.   Stairs             Wheelchair Mobility    Modified Rankin (Stroke Patients Only)       Balance Overall balance assessment: Needs assistance Sitting-balance support: No upper extremity supported;Feet supported Sitting balance-Leahy Scale: Good     Standing balance support: Single extremity supported Standing balance-Leahy Scale: Poor Standing balance comment: Reliant on UE support and phsycial assistance to stand.                            Cognition Arousal/Alertness: Awake/alert Behavior During Therapy: WFL for tasks assessed/performed Overall Cognitive Status: Within Functional Limits for tasks assessed  Exercises General Exercises - Lower Extremity Long Arc Quad: Both;Seated;10 reps;Strengthening Hip Flexion/Marching: Strengthening;Both;10 reps;Seated    General Comments        Pertinent Vitals/Pain Pain Assessment: Faces Faces Pain Scale: Hurts even more Pain  Location: RUE and RLE Pain Descriptors / Indicators: Aching;Grimacing;Guarding;Sore Pain Intervention(s): Limited activity within patient's tolerance;Monitored during session;Premedicated before session;Repositioned    Home Living                      Prior Function            PT Goals (current goals can now be found in the care plan section) Acute Rehab PT Goals Patient Stated Goal: to improve PT Goal Formulation: With patient Time For Goal Achievement: 04/01/21 Potential to Achieve Goals: Good Progress towards PT goals: Progressing toward goals    Frequency    Min 3X/week      PT Plan Current plan remains appropriate    Co-evaluation              AM-PAC PT "6 Clicks" Mobility   Outcome Measure  Help needed turning from your back to your side while in a flat bed without using bedrails?: A Lot Help needed moving from lying on your back to sitting on the side of a flat bed without using bedrails?: A Lot Help needed moving to and from a bed to a chair (including a wheelchair)?: Total Help needed standing up from a chair using your arms (e.g., wheelchair or bedside chair)?: Total Help needed to walk in hospital room?: Total Help needed climbing 3-5 steps with a railing? : Total 6 Click Score: 8    End of Session Equipment Utilized During Treatment: Gait belt;Other (comment) (sling) Activity Tolerance: Patient tolerated treatment well;Patient limited by fatigue;Patient limited by pain Patient left: with call bell/phone within reach;in chair;with chair alarm set   PT Visit Diagnosis: Other abnormalities of gait and mobility (R26.89);Pain;Unsteadiness on feet (R26.81);Muscle weakness (generalized) (M62.81);Difficulty in walking, not elsewhere classified (R26.2) Pain - Right/Left: Right Pain - part of body: Shoulder;Hip     Time: 0947-0962 PT Time Calculation (min) (ACUTE ONLY): 18 min  Charges:  $Therapeutic Activity: 8-22 mins                      Moishe Spice, PT, DPT Acute Rehabilitation Services  Pager: [phone] Office: [place] 03/21/2021, 4:44 PM

## 2021-03-22 LAB — COMPREHENSIVE METABOLIC PANEL
ALT: 16 U/L (ref 0–44)
AST: 22 U/L (ref 15–41)
Albumin: 2.4 g/dL — ABNORMAL LOW (ref 3.5–5.0)
Alkaline Phosphatase: 42 U/L (ref 38–126)
Anion gap: 5 (ref 5–15)
BUN: 8 mg/dL (ref 8–23)
CO2: 31 mmol/L (ref 22–32)
Calcium: 8.4 mg/dL — ABNORMAL LOW (ref 8.9–10.3)
Chloride: 95 mmol/L — ABNORMAL LOW (ref 98–111)
Creatinine, Ser: 0.49 mg/dL (ref 0.44–1.00)
GFR, Estimated: >60 mL/min (ref >60)
Glucose, Bld: 101 mg/dL — ABNORMAL HIGH (ref 70–99)
Potassium: 4.1 mmol/L (ref 3.5–5.1)
Sodium: 131 mmol/L — ABNORMAL LOW (ref 135–145)
Total Bilirubin: 1.3 mg/dL — ABNORMAL HIGH (ref 0.3–1.2)
Total Protein: 4.7 g/dL — ABNORMAL LOW (ref 6.5–8.1)

## 2021-03-22 LAB — CBC WITH DIFFERENTIAL/PLATELET
Abs Immature Granulocytes: 0.04 10*3/uL (ref 0.00–0.07)
Basophils Absolute: 0 10*3/uL (ref 0.0–0.1)
Basophils Relative: 0 %
Eosinophils Absolute: 0.2 10*3/uL (ref 0.0–0.5)
Eosinophils Relative: 2 %
HCT: 26.2 % — ABNORMAL LOW (ref 36.0–46.0)
Hemoglobin: 8.9 g/dL — ABNORMAL LOW (ref 12.0–15.0)
Immature Granulocytes: 1 %
Lymphocytes Relative: 24 %
Lymphs Abs: 1.9 10*3/uL (ref 0.7–4.0)
MCH: 32.2 pg (ref 26.0–34.0)
MCHC: 34 g/dL (ref 30.0–36.0)
MCV: 94.9 fL (ref 80.0–100.0)
Monocytes Absolute: 1 10*3/uL (ref 0.1–1.0)
Monocytes Relative: 12 %
Neutro Abs: 4.9 10*3/uL (ref 1.7–7.7)
Neutrophils Relative %: 61 %
Platelets: 225 10*3/uL (ref 150–400)
RBC: 2.76 MIL/uL — ABNORMAL LOW (ref 3.87–5.11)
RDW: 13.4 % (ref 11.5–15.5)
WBC: 8.1 10*3/uL (ref 4.0–10.5)
nRBC: 0 % (ref 0.0–0.2)

## 2021-03-22 MED ORDER — OXCARBAZEPINE 150 MG PO TABS: 150.0000 mg | ORAL_TABLET | Freq: Two times a day (BID) | ORAL | 0 refills | Status: AC

## 2021-03-22 MED ORDER — SENNOSIDES-DOCUSATE SODIUM 8.6-50 MG PO TABS: 1.0000 | ORAL_TABLET | Freq: Every day | ORAL | Status: DC

## 2021-03-22 MED ORDER — PANTOPRAZOLE SODIUM 40 MG PO TBEC: 40.0000 mg | DELAYED_RELEASE_TABLET | Freq: Every day | ORAL | Status: AC

## 2021-03-22 MED ORDER — METOPROLOL TARTRATE 25 MG PO TABS: 12.5000 mg | ORAL_TABLET | Freq: Two times a day (BID) | ORAL | Status: AC

## 2021-03-22 MED ORDER — POLYETHYLENE GLYCOL 3350 17 G PO PACK: 17.0000 g | PACK | Freq: Every day | ORAL | 0 refills | Status: DC

## 2021-03-22 MED ORDER — ESCITALOPRAM OXALATE 10 MG PO TABS: 10.0000 mg | ORAL_TABLET | Freq: Every day | ORAL | 0 refills | Status: DC

## 2021-03-22 MED ORDER — LOSARTAN POTASSIUM 25 MG PO TABS: 25.0000 mg | ORAL_TABLET | Freq: Every day | ORAL | Status: DC

## 2021-03-22 MED ORDER — OXYCODONE HCL 5 MG/5ML PO SOLN: 5.0000 mg | ORAL | 0 refills | Status: DC | PRN

## 2021-03-22 MED ORDER — METHOCARBAMOL 500 MG PO TABS: 1000.0000 mg | ORAL_TABLET | Freq: Three times a day (TID) | ORAL | Status: AC

## 2021-03-22 NOTE — Discharge Summary (Signed)
Physician Discharge Summary  Terri Liu JJH:[id number] DOB: 08/04/1934 DOA: 03/16/2021  PCP: Leeroy Cha, MD  Admit date: 03/16/2021 Discharge date: 03/22/2021  Time spent: 37 minutes  Recommendations for Outpatient Follow-up:  Outpatient follow-up with Dr. Lucia Gaskins in  4 weeks from day of admission for x-rays of right shoulder right pelvis CBC, Chem-12 in 3 to 4 days at skilled facility New medications this admission-opiates, Robaxin, Protonix as below-careful with escalation of opiates patient sensitive to the same but cannot use ibuprofen given gastric irritation/nausea Consider outpatient A1c   Discharge Diagnoses:  MAIN problem for hospitalization   Falls and fractures  Please see below for itemized issues addressed in [place]- refer to other progress notes for clarity if needed  Discharge Condition: Coherent and in no distress and improved  Diet recommendation: Diabetic heart healthy  Filed Weights   03/16/21 2152 03/18/21 0606  Weight: 80.7 kg 79.8 kg    History of present illness:  87 year old white female HTN, postherpetic neuralgia, HLD, left hip fracture 01/2017 COVID-pneumonia 09/14/2020 status post remdesivir, steroids HFpEF/HFrEF-Echo 40-45% 12/31/2019 + chronic LBBB   Status post Baker's cyst fluid drainage 03/16/2021-left knee subsequently gave out patient fell onto her right side-hit right face-severe pain right arm  In emergency room trauma work-up revealed fracture right humeral head and neck sacral ala and superior inferior pubic rami on the right  Orthopedics and trauma were consulted-patient deemed her nonoperative candidate and was seen by Dr. Lucia Gaskins with outpatient plans for follow-up x-rays  Hospital Course:  Accidental fall-polytrauma being followed by trauma service Right humerus hd fracture, sacral  # non-operable/ orthopedics--he would like to see her in his office in 4 weeks post discharge for x-rays and follow-up plan Ibuprofen HELD [n,  dyshpahgia] Oxy-ir 5-10 managed to every 3 severe pain-watch mentation--May use Tylenol as first choice per PCP discretion Needs shoulder/pelvis x-rays 4 weeks Dr. Lucia Gaskins office OP DEXA scan--25 hydroxy vitamin D low normal range Aspirin resumed on discharge Abd discomfort, nausea after receiving laxative Start protonix 40 qd Monitor symptoms-much improved on discharge eating and drinking some Iliopsoas hematomas with bladder displacement SCDs per trauma--continue aspirin on discharge in addition to the SCDs for some prophylaxis against DVT HFpEF/HFrEF EF 40%-compensated no acute component Resume metoprolol @ 12.5 twice daily, losartan from 25 daily Titrate metoprolol in the outpatient setting back to prior to admission dose of 25 Leukocytosis on admission Likely 2/2 stress of fracture-antibiotics not indicated at this time Normocytic anemia ?  B12 deficiency in addition-continue B12 supplements, continue prenatal multivitamins Depression Continue Trileptal 150 twice daily-prescription given Hyperglycemia not otherwise specified CBGs below 180 during hospital stay Outpatient discussion about A1c and further planning  Procedures: X-rays and CTs  Consultations: Trauma surgery Dr. Lucia Gaskins orthopedics  Discharge Exam: Vitals:   03/22/21 0419 03/22/21 0913  BP: (!) 144/59 (!) 127/46  Pulse: 72 96  Resp: 17 18  Temp: 97.9 F (36.6 C) 98.3 F (36.8 C)  SpO2: 94% 94%    Subj on day of d/c   Alert coherent pleasant and much less abdominal distress No nausea no vomiting ate half of her pancake Pain is manageable She is ready for facility she states   General Exam on discharge  EOMI NCAT no focal deficit chest clear no added sound Right upper arm is in sling Right lower extremity restricted movement secondary to pain other limbs however within normal limits S1-S2 murmur noted Abdomen is soft no rebound no epigastric tenderness cannot appreciate organomegaly Chest grossly  clear  Discharge Instructions   Discharge Instructions     Diet - low sodium heart healthy   Complete by: As directed    Increase activity slowly   Complete by: As directed       Allergies as of 03/22/2021       Reactions   Cymbalta [duloxetine Hcl] Other (See Comments)   Made her very sick (flu-like symptoms)   Lipitor [atorvastatin] Other (See Comments)   Muscle pain in legs   Other Other (See Comments)   Other reaction(s): OPIOID - analgesic unknown reaction   Amlodipine Besylate-valsartan Rash   Codeine Rash   Hydrocodone-acetaminophen Rash   Tape Other (See Comments)   Pulled skin off. Paper tape is ok.         Medication List     STOP taking these medications    ascorbic acid 500 MG tablet Commonly known as: VITAMIN C   BIOTIN FORTE PO   cholecalciferol 1000 units tablet Commonly known as: VITAMIN D   TART CHERRY ADVANCED PO   traMADol 50 MG tablet Commonly known as: ULTRAM       TAKE these medications    acetaminophen 500 MG tablet Commonly known as: TYLENOL Take 1,000 mg by mouth every 8 (eight) hours as needed for mild pain.   ARTIFICIAL TEARS OP Place 1 drop into both eyes daily as needed (dry eyes).   aspirin 81 MG EC tablet Take 1 tablet (81 mg total) by mouth daily. What changed: when to take this   escitalopram 10 MG tablet Commonly known as: LEXAPRO Take 1 tablet (10 mg total) by mouth daily.   fluticasone 50 MCG/ACT nasal spray Commonly known as: FLONASE Place 1 spray into both nostrils daily.   losartan 25 MG tablet Commonly known as: COZAAR Take 1 tablet (25 mg total) by mouth daily. Start taking on: March 23, 2021 What changed:  medication strength how much to take   lovastatin 40 MG tablet Commonly known as: MEVACOR Take 40 mg by mouth daily.   methocarbamol 500 MG tablet Commonly known as: ROBAXIN Take 2 tablets (1,000 mg total) by mouth every 8 (eight) hours.   metoprolol tartrate 25 MG tablet Commonly  known as: LOPRESSOR Take 0.5 tablets (12.5 mg total) by mouth 2 (two) times daily. What changed: how much to take   OXcarbazepine 150 MG tablet Commonly known as: TRILEPTAL Take 1 tablet (150 mg total) by mouth 2 (two) times daily.   oxyCODONE 5 MG/5ML solution Commonly known as: ROXICODONE Take 5-10 mLs (5-10 mg total) by mouth every 3 (three) hours as needed for moderate pain or severe pain (5mg  for moderate pain, 10mg  for severe pain).   pantoprazole 40 MG tablet Commonly known as: PROTONIX Take 1 tablet (40 mg total) by mouth daily. Start taking on: March 23, 2021   polyethylene glycol 17 g packet Commonly known as: MIRALAX / GLYCOLAX Take 17 g by mouth daily. Start taking on: March 23, 2021   prenatal multivitamin Tabs tablet Take 1 tablet by mouth every morning.   senna-docusate 8.6-50 MG tablet Commonly known as: Senokot-S Take 1 tablet by mouth at bedtime.   vitamin B-12 100 MCG tablet Commonly known as: CYANOCOBALAMIN Take 100 mcg by mouth daily.       Allergies  Allergen Reactions   Cymbalta [Duloxetine Hcl] Other (See Comments)    Made her very sick (flu-like symptoms)   Lipitor [Atorvastatin] Other (See Comments)    Muscle pain in legs   Other Other (See  Comments)    Other reaction(s): OPIOID - analgesic unknown reaction   Amlodipine Besylate-Valsartan Rash   Codeine Rash   Hydrocodone-Acetaminophen Rash   Tape Other (See Comments)    Pulled skin off. Paper tape is ok.       The results of significant diagnostics from this hospitalization (including imaging, microbiology, ancillary and laboratory) are listed below for reference.    Significant Diagnostic Studies: CT ABDOMEN PELVIS WO CONTRAST  Result Date: 03/18/2021 CLINICAL DATA:  Suspect retroperitoneal hematoma. EXAM: CT ABDOMEN AND PELVIS WITHOUT CONTRAST TECHNIQUE: Multidetector CT imaging of the abdomen and pelvis was performed following the standard protocol without IV contrast. COMPARISON:   03/17/2021 FINDINGS: Lower chest: Atelectasis noted dependent right lung base. Hepatobiliary: No focal abnormality in the liver on this study without intravenous contrast. Gallbladder is surgically absent. No intrahepatic or extrahepatic biliary dilation. Pancreas: No focal mass lesion. No dilatation of the main duct. No intraparenchymal cyst. No peripancreatic edema. Spleen: No splenomegaly. No focal mass lesion. Adrenals/Urinary Tract: No adrenal nodule or mass. Several cysts again noted right kidney measuring up to approximately 4.9 cm. Left kidney unremarkable on noncontrast imaging. No evidence for hydroureter. The urinary bladder appears normal for the degree of distention. Stomach/Bowel: Stomach is unremarkable. No gastric wall thickening. No evidence of outlet obstruction. Duodenum is normally positioned as is the ligament of Treitz. No small bowel wall thickening. No small bowel dilatation. The terminal ileum is normal. The appendix is normal. No gross colonic mass. No colonic wall thickening. Vascular/Lymphatic: There is moderate calcified aortic atherosclerosis without aneurysm. There is no gastrohepatic or hepatoduodenal ligament lymphadenopathy. No retroperitoneal or mesenteric lymphadenopathy. No pelvic sidewall lymphadenopathy. Reproductive: Uterus surgically absent.  There is no adnexal mass. Other: No substantial intraperitoneal free fluid. Musculoskeletal: Fixation hardware noted left proximal femur. Right sacral fracture extending through the SI joint into the posterior iliac bone is associated with fractures of the right superior and inferior pubic rami. Diffuse degenerative disc disease noted in the lower thoracic and lumbar spine. Slight interval increase in edema/hemorrhage in the presacral space. The hemorrhage seen previously in the right pelvic sidewall and space of Retzius anterior to the bladder has decreased in the interval. IMPRESSION: 1. Right sacral fracture extending through the SI  joint into the posterior iliac bone is associated with fractures of the right superior and inferior pubic rami. No substantial change 2. No new evidence for retroperitoneal hematoma. There is some increase in edema/hemorrhage in the presacral space, potentially representing dependent migration of blood products from before as the right pelvic sidewall and perivesical edema/hemorrhage has decreased since the prior. 3. Right renal cysts. 4. Aortic Atherosclerosis (ICD10-I70.0). Electronically Signed   By: Misty Stanley M.D.   On: 03/18/2021 18:44   DG Shoulder Right  Result Date: 03/16/2021 CLINICAL DATA:  Pain and limited range of motion after a fall. EXAM: RIGHT SHOULDER - 2+ VIEW COMPARISON:  03/05/2018 FINDINGS: Acute comminuted fractures involving the right humeral head and neck. Fracture lines extend across the greater tuberosity with mild displacement of the tuberosity fragment. Fracture fragments are impacted. No glenohumeral dislocation. Degenerative changes in the glenohumeral and acromioclavicular joints. Increase of the subacromial space likely represents an effusion. IMPRESSION: Acute comminuted fractures of the right humeral head and neck. Electronically Signed   By: Lucienne Capers M.D.   On: 03/16/2021 22:59   CT HEAD WO CONTRAST  Result Date: 03/17/2021 CLINICAL DATA:  Mechanical fall.  Head trauma. EXAM: CT HEAD WITHOUT CONTRAST CT CERVICAL SPINE WITHOUT CONTRAST  TECHNIQUE: Multidetector CT imaging of the head and cervical spine was performed following the standard protocol without intravenous contrast. Multiplanar CT image reconstructions of the cervical spine were also generated. COMPARISON:  MRI brain 06/23/2017 FINDINGS: CT HEAD FINDINGS Brain: No evidence of acute infarction, hemorrhage, hydrocephalus, extra-axial collection or mass lesion/mass effect. Mild cerebral atrophy. Patchy low-attenuation changes in the deep white matter consistent with small vessel ischemia. Old lacunar infarct  in the left basal ganglia. Vascular: Moderate intracranial arterial vascular calcifications. Skull: Calvarium appears intact. Sinuses/Orbits: Paranasal sinuses and mastoid air cells are clear. Other: None. CT CERVICAL SPINE FINDINGS Alignment: Straightening of usual cervical lordosis is likely positional or degenerative. No anterior subluxations. Normal alignment of the facet joints. C1-2 articulation appears intact. Head tilt stores the right, likely positional but could indicate muscle spasm. Skull base and vertebrae: No acute fracture. No primary bone lesion or focal pathologic process. Soft tissues and spinal canal: No prevertebral fluid or swelling. No visible canal hematoma. Disc levels: Degenerative changes throughout with narrowed disc spaces and endplate osteophyte formation. Degenerative changes in the facet joints. Upper chest: Lung apices are clear. Other: None. IMPRESSION: 1. No acute intracranial abnormalities. Mild cerebral atrophy and small vessel ischemic changes. 2. Nonspecific straightening of usual cervical lordosis. Diffuse degenerative change. No acute displaced fractures identified. Electronically Signed   By: Lucienne Capers M.D.   On: 03/17/2021 00:59   CT CERVICAL SPINE WO CONTRAST  Result Date: 03/17/2021 CLINICAL DATA:  Mechanical fall.  Head trauma. EXAM: CT HEAD WITHOUT CONTRAST CT CERVICAL SPINE WITHOUT CONTRAST TECHNIQUE: Multidetector CT imaging of the head and cervical spine was performed following the standard protocol without intravenous contrast. Multiplanar CT image reconstructions of the cervical spine were also generated. COMPARISON:  MRI brain 06/23/2017 FINDINGS: CT HEAD FINDINGS Brain: No evidence of acute infarction, hemorrhage, hydrocephalus, extra-axial collection or mass lesion/mass effect. Mild cerebral atrophy. Patchy low-attenuation changes in the deep white matter consistent with small vessel ischemia. 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 Vascular:  Moderate intracranial arterial vascular calcifications. Skull: Calvarium appears intact. Sinuses/Orbits: Paranasal sinuses and mastoid air cells are clear. Other: None. CT CERVICAL SPINE FINDINGS Alignment: Straightening of usual cervical lordosis is likely positional or degenerative. No anterior subluxations. Normal alignment of the facet joints. C1-2 articulation appears intact. Head tilt stores the right, likely positional but could indicate muscle spasm. Skull base and vertebrae: No acute fracture. No primary bone lesion or focal pathologic process. Soft tissues and spinal canal: No prevertebral fluid or swelling. No visible canal hematoma. Disc levels: Degenerative changes throughout with narrowed disc spaces and endplate osteophyte formation. Degenerative changes in the facet joints. Upper chest: Lung apices are clear. Other: None. IMPRESSION: 1. No acute intracranial abnormalities. Mild cerebral atrophy and small vessel ischemic changes. 2. Nonspecific straightening of usual cervical lordosis. Diffuse degenerative change. No acute displaced fractures identified. Electronically Signed   By: Lucienne Capers M.D.   On: 03/17/2021 00:59   CT CHEST ABDOMEN PELVIS W CONTRAST  Result Date: 03/17/2021 CLINICAL DATA:  Mechanical fall.  Right hip pain. EXAM: CT CHEST, ABDOMEN, AND PELVIS WITH CONTRAST TECHNIQUE: Multidetector CT imaging of the chest, abdomen and pelvis was performed following the standard protocol during bolus administration of intravenous contrast. CONTRAST:  129mL OMNIPAQUE IOHEXOL 300 MG/ML  SOLN COMPARISON:  Chest radiograph 03/16/2021 FINDINGS: CT CHEST FINDINGS Cardiovascular: Normal heart size. No pericardial effusions. Coronary artery, aortic, and aortic valve calcifications. Normal caliber thoracic aorta. No dissection. Great vessel origins are patent. Mediastinum/Nodes:  Esophagus is decompressed. No significant lymphadenopathy. Thyroid gland is unremarkable. No abnormal mediastinal gas or  fluid collections. Lungs/Pleura: Atelectasis in the right lung base. Lungs are otherwise clear and expanded. No pleural effusions. No pneumothorax. Airways are patent. Musculoskeletal: Comminuted and displaced fractures of the right humeral head and neck. No glenohumeral dislocation. Right shoulder effusion. Intramuscular lipoma in the posterior shoulder musculature. Large cystic collections anterior to the left shoulder and in the subcoracoid region likely bursal collections. Prominent degenerative changes in the left shoulder. Normal alignment of the thoracic spine. Diffuse degenerative changes. No acute compression deformities identified. Visualized ribs appear intact. No sternal depression. CT ABDOMEN PELVIS FINDINGS Hepatobiliary: No focal liver abnormality is seen. Status post cholecystectomy. No biliary dilatation. Pancreas: Unremarkable. No pancreatic ductal dilatation or surrounding inflammatory changes. Spleen: Normal in size without focal abnormality. Adrenals/Urinary Tract: No adrenal gland nodules. Bilateral renal cysts. Nephrograms are homogeneous and normal. No hydronephrosis or hydroureter. Bladder is displaced towards the left by right obturator/ileus psoas hematoma. Stomach/Bowel: Stomach is within normal limits. Appendix appears normal. No evidence of bowel wall thickening, distention, or inflammatory changes. Vascular/Lymphatic: Aortic atherosclerosis. No enlarged abdominal or pelvic lymph nodes. Reproductive: Status post hysterectomy. No adnexal masses. Other: No free air or free fluid in the abdomen. Musculoskeletal: Degenerative changes in the lumbar spine. Normal alignment. No vertebral compression deformities. Acute linear nondisplaced fractures are demonstrated in the right sacral ala extending through the SI joint into the posterosuperior right iliac bone. The SI joint is not significantly widened. Mildly displaced fractures of the right superior and inferior pubic rami. Associated  hematomas in the right obturator and ileus psoas regions with displacement of the bladder towards the right. Degenerative changes in the hips. Previous internal fixation of the left hip. No hip fractures are demonstrated. Lumbar scoliosis convex towards the left. IMPRESSION: 1. No evidence of mediastinal or pulmonary parenchymal injury. No evidence of solid abdominal organ injury or bowel perforation. 2. Comminuted fractures of the proximal right humerus. 3. Nondisplaced fractures of the right sacral ala extending through the SI joint into the posterosuperior right iliac bone. No widening of the sacral iliac joint. 4. Mildly displaced fractures of the right superior and inferior pubic rami with associated hematomas in the iliopsoas and obturator regions. Hematoma causes displacement bladder towards the left. 5. Degenerative changes in the left shoulder with large bursal collections. 6. Right lung base atelectasis. 7. Aortic atherosclerosis. Electronically Signed   By: Lucienne Capers M.D.   On: 03/17/2021 01:15   DG Chest Portable 1 View  Result Date: 03/16/2021 CLINICAL DATA:  Trauma.  Mechanical fall. EXAM: PORTABLE CHEST 1 VIEW COMPARISON:  Chest x-ray 09/13/2020. FINDINGS: The heart size and mediastinal contours are unchanged. Aortic calcification. Similar-appearing elevated right hemidiaphragm. Right base atelectasis. No focal consolidation. No pulmonary edema. No pleural effusion. No pneumothorax. Comminuted right humeral fracture new from 09/13/2020. IMPRESSION: 1. Comminuted right humeral fracture new from 09/13/2020. please see separately dictated dedicated right shoulder radiographs 03/16/2021. 2. No acute cardiopulmonary abnormality. Electronically Signed   By: Iven Finn M.D.   On: 03/16/2021 23:30   DG Humerus Right  Result Date: 03/16/2021 CLINICAL DATA:  Pain and limited range of motion after a fall. EXAM: RIGHT HUMERUS - 2+ VIEW COMPARISON:  03/05/2018 FINDINGS: Acute comminuted fractures  of the right humeral head and neck with superior displacement and impaction of the distal fracture fragments resulting in varus angulation. The midshaft and distal right humerus are intact. Diffuse soft tissue swelling. IMPRESSION: Acute comminuted fractures  of the right humeral head and neck with varus angulation. Electronically Signed   By: Lucienne Capers M.D.   On: 03/16/2021 22:58   DG Hip Unilat With Pelvis 2-3 Views Right  Result Date: 03/16/2021 CLINICAL DATA:  Pain and limited range of motion after a fall EXAM: DG HIP (WITH OR WITHOUT PELVIS) 2-3V RIGHT COMPARISON:  None. FINDINGS: Degenerative changes in the lower lumbar spine and hips. Postoperative internal fixation of the left proximal femur. Acute appearing displaced fractures of the right superior and inferior pubic rami. No right hip fracture identified. SI joints and symphysis pubis are not displaced. IMPRESSION: Acute displaced fractures of the right superior and inferior pubic rami. Degenerative changes in the lower lumbar spine and hips. Electronically Signed   By: Lucienne Capers M.D.   On: 03/16/2021 22:57    Microbiology: Recent Results (from the past 240 hour(s))  Resp Panel by RT-PCR (Flu A&B, Covid) Nasopharyngeal Swab     Status: None   Collection Time: 03/16/21 11:55 PM   Specimen: Nasopharyngeal Swab; Nasopharyngeal(NP) swabs in vial transport medium  Result Value Ref Range Status   SARS Coronavirus 2 by RT PCR NEGATIVE NEGATIVE Final    Comment: (NOTE) SARS-CoV-2 target nucleic acids are NOT DETECTED.  The SARS-CoV-2 RNA is generally detectable in upper respiratory specimens during the acute phase of infection. The lowest concentration of SARS-CoV-2 viral copies this assay can detect is 138 copies/mL. A negative result does not preclude SARS-Cov-2 infection and should not be used as the sole basis for treatment or other patient management decisions. A negative result may occur with  improper specimen  collection/handling, submission of specimen other than nasopharyngeal swab, presence of viral mutation(s) within the areas targeted by this assay, and inadequate number of viral copies(<138 copies/mL). A negative result must be combined with clinical observations, patient history, and epidemiological information. The expected result is Negative.  Fact Sheet for Patients:  EntrepreneurPulse.com.au  Fact Sheet for Healthcare Providers:  IncredibleEmployment.be  This test is no t yet approved or cleared by the [place] FDA and  has been authorized for detection and/or diagnosis of SARS-CoV-2 by FDA under an Emergency Use Authorization (EUA). This EUA will remain  in effect (meaning this test can be used) for the duration of the COVID-19 declaration under Section 564(b)(1) of the Act, 21 U.S.C.section 360bbb-3(b)(1), unless the authorization is terminated  or revoked sooner.       Influenza A by PCR NEGATIVE NEGATIVE Final   Influenza B by PCR NEGATIVE NEGATIVE Final    Comment: (NOTE) The Xpert Xpress SARS-CoV-2/FLU/RSV plus assay is intended as an aid in the diagnosis of influenza from Nasopharyngeal swab specimens and should not be used as a sole basis for treatment. Nasal washings and aspirates are unacceptable for Xpert Xpress SARS-CoV-2/FLU/RSV testing.  Fact Sheet for Patients: EntrepreneurPulse.com.au  Fact Sheet for Healthcare Providers: IncredibleEmployment.be  This test is not yet approved or cleared by the [place] FDA and has been authorized for detection and/or diagnosis of SARS-CoV-2 by FDA under an Emergency Use Authorization (EUA). This EUA will remain in effect (meaning this test can be used) for the duration of the COVID-19 declaration under Section 564(b)(1) of the Act, 21 U.S.C. section 360bbb-3(b)(1), unless the authorization is terminated or revoked.  Performed at [facility], [place] [address] [place], [place] [address]      Labs: Basic Metabolic Panel: Recent Labs  Lab 03/17/21 [phone] 03/18/21 6295 03/19/21 2841 03/20/21 0056 03/22/21 0106  NA 135 135 130* 130* 131*  K 4.4 4.4 4.7 4.6 4.1  CL 99 100 99 96* 95*  CO2 29 29 26 30 31   GLUCOSE 165* 111* 109* 111* 101*  BUN 15 17 15 10 8   CREATININE 0.60 0.79 0.68 0.57 0.49  CALCIUM 9.0 8.7* 8.5* 8.5* 8.4*   Liver Function Tests: Recent Labs  Lab 03/20/21 0056 03/22/21 0106  AST 17 22  ALT 15 16  ALKPHOS 34* 42  BILITOT 1.2 1.3*  PROT 4.6* 4.7*  ALBUMIN 2.4* 2.4*   No results for input(s): LIPASE, AMYLASE in the last 168 hours. No results for input(s): AMMONIA in the last 168 hours. CBC: Recent Labs  Lab 03/16/21 2203 03/17/21 0435 03/18/21 1411 03/18/21 2108 03/19/21 0051 03/20/21 0056 03/22/21 0106  WBC 14.3*   < > 8.2 8.0 8.8 7.1 8.1  NEUTROABS 11.4*  --   --   --   --   --  4.9  HGB 13.2   < > 8.5* 8.9* 8.7* 8.8* 8.9*  HCT 39.7   < > 26.0* 26.5* 26.4* 25.7* 26.2*  MCV 96.1   < > 97.7 97.4 98.9 95.9 94.9  PLT 221   < > 168 148* 156 152 225   < > = values in this interval not displayed.   Cardiac Enzymes: No results for input(s): CKTOTAL, CKMB, CKMBINDEX, TROPONINI in the last 168 hours. BNP: BNP (last 3 results) No results for input(s): BNP in the last 8760 hours.  ProBNP (last 3 results) No results for input(s): PROBNP in the last 8760 hours.  CBG: No results for input(s): GLUCAP in the last 168 hours.     Signed:  Nita Sells MD   [facility] 03/22/2021, 10:35 AM

## 2021-03-22 NOTE — Care Management Important Message (Signed)
Important Message  Patient Details  Name: Terri Liu MRN: [id number] Date of Birth: 1934/01/30   Medicare Important Message Given:  Yes - Important Message mailed due to current National Emergency   Verbal consent obtained due to current National Emergency  Relationship to patient: Self Contact Name: Honor Call Date: 03/22/21  Time: 1522 Phone: [phone] Outcome: Spoke with contact Important Message mailed to: Patient address on file    Delorse Lek 03/22/2021, 3:23 PM

## 2021-03-22 NOTE — TOC Progression Note (Signed)
Transition of Care [facility]) - Progression Note    Patient Details  Name: Terri Liu MRN: [id number] Date of Birth: 1934/05/13  Transition of Care [facility]) CM/SW [place], [place] Phone Number: 03/22/2021, 4:48 PM  Clinical Narrative:     CSW gave pt bed offers. PT wants to go to [place]. Miquel Dunn is still pending in the hub, CSW reached out multiple times with no response. CSW requested that pt look at the accepted facilities as backup. Pt continues to say she will hold out til she hears from [place]. CSW started insurance auth for SNF and PTAR. CSW requested covid test from MD.   Expected Discharge Plan: [place] Barriers to Discharge: Ship broker, SNF Pending bed offer, Continued Medical Work up  Expected Discharge Plan and Services Expected Discharge Plan: [place] Choice: [place] arrangements for the past 2 months: Single Family Home Expected Discharge Date: 03/22/21                                     Social Determinants of Health (SDOH) Interventions    Readmission Risk Interventions No flowsheet data found.  Emeterio Reeve, Latanya Presser, [place] Social Worker [phone]

## 2021-03-23 ENCOUNTER — Ambulatory Visit: Payer: PPO | Admitting: Physician Assistant

## 2021-03-23 LAB — SARS CORONAVIRUS 2 (TAT 6-24 HRS): SARS Coronavirus 2: NEGATIVE

## 2021-03-23 NOTE — Discharge Summary (Signed)
Physician Discharge Summary  JONNELL HENTGES ZSW:[id number] DOB: [birth year] DOA: 03/16/2021  PCP: Leeroy Cha, MD  Admit date: 03/16/2021 Discharge date: 03/23/2021  Time spent: 37 minutes  Recommendations for Outpatient Follow-up:  Outpatient follow-up with Dr. Lucia Gaskins in  4 weeks from day of admission for x-rays of right shoulder right pelvis CBC, Chem-12 in 3 to 4 days at skilled facility New medications this admission-opiates, Robaxin, Protonix as below-careful with escalation of opiates patient sensitive to the same but cannot use ibuprofen given gastric irritation/nausea Consider outpatient A1c   Discharge Diagnoses:  MAIN problem for hospitalization   Falls and fractures  Please see below for itemized issues addressed in [facility]- refer to other progress notes for clarity if needed  Discharge Condition: Coherent and in no distress and improved  Diet recommendation: Diabetic heart healthy  Filed Weights   03/16/21 2152 03/18/21 0606 03/23/21 0500  Weight: 80.7 kg 79.8 kg 81.4 kg    History of present illness:  87 year old white female HTN, postherpetic neuralgia, HLD, left hip fracture 01/2017 COVID-pneumonia 09/14/2020 status post remdesivir, steroids HFpEF/HFrEF-Echo 40-45% 12/31/2019 + chronic LBBB   S/p Baker's cyst fluid drainage 03/16/2021-left knee subsequently gave out patient fell onto her right side-hit right face-severe pain right arm  In emergency room trauma work-up revealed fracture right humeral head and neck sacral ala and superior inferior pubic rami on the right  Orthopedics and trauma were consulted-patient deemed her nonoperative candidate and was seen by Dr. Lucia Gaskins with outpatient plans for follow-up x-rays  Hospital Course:  Accidental fall-polytrauma being followed by trauma service Right humerus hd fracture, sacral  # non-operable/ orthopedics--he would like to see her in his office in 4 weeks post discharge for x-rays and follow-up  plan Ibuprofen HELD [n, dyshpahgia] Oxy-ir 5-10 managed to every 3 severe pain-watch mentation-Tylenol 1st choice/PCP discretion Needs shoulder/pelvis x-rays 4 weeks Dr. Lucia Gaskins office OP DEXA scan--25 hydroxy vitamin D low normal range Aspirin resumed on discharge Abd discomfort, nausea after receiving laxative Start protonix 40 qd Monitor symptoms-much improved on discharge eating and drinking some Iliopsoas hematomas with bladder displacement SCDs per trauma--continue aspirin on discharge in addition to the SCDs for some prophylaxis against DVT HFpEF/HFrEF EF 40%-compensated no acute component Resume metoprolol @ 12.5 twice daily, losartan from 25 daily Titrate metoprolol in the outpatient setting back to prior to admission dose of 25 Leukocytosis on admission Likely 2/2 stress of fracture-antibiotics not indicated at this time Normocytic anemia ?  B12 deficiency in addition-continue B12 supplements, continue prenatal multivitamins Depression Continue Trileptal 150 twice daily-prescription given Hyperglycemia not otherwise specified CBGs below 180 during hospital stay Outpatient discussion about A1c and further planning  Procedures: X-rays and CTs  Consultations: Trauma surgery Dr. Lucia Gaskins orthopedics  Discharge Exam: Vitals:   03/23/21 0815 03/23/21 1153  BP: (!) 147/48 (!) 138/54  Pulse: 96 72  Resp: 20 20  Temp: 97.8 F (36.6 C) 97.9 F (36.6 C)  SpO2: 97% 93%    Subj on day of d/c   Well no new issue Eatign drinking No dyspepsia,cp,f,chills,n,v  General Exam on discharge  EOMI NCAT  no focal deficit  chest clear no added sound Right upper arm is in sling Right lower extremity restricted movement secondary to pain other limbs however within normal limits S1-S2 murmur noted Abd soft Chest grossly clear  Discharge Instructions   Discharge Instructions     Diet - low sodium heart healthy   Complete by: As directed    Increase activity slowly  Complete  by: As directed       Allergies as of 03/23/2021       Reactions   Cymbalta [duloxetine Hcl] Other (See Comments)   Made her very sick (flu-like symptoms)   Lipitor [atorvastatin] Other (See Comments)   Muscle pain in legs   Other Other (See Comments)   Other reaction(s): OPIOID - analgesic unknown reaction   Amlodipine Besylate-valsartan Rash   Codeine Rash   Hydrocodone-acetaminophen Rash   Tape Other (See Comments)   Pulled skin off. Paper tape is ok.         Medication List     STOP taking these medications    ascorbic acid 500 MG tablet Commonly known as: VITAMIN C   BIOTIN FORTE PO   cholecalciferol 1000 units tablet Commonly known as: VITAMIN D   TART CHERRY ADVANCED PO   traMADol 50 MG tablet Commonly known as: ULTRAM       TAKE these medications    acetaminophen 500 MG tablet Commonly known as: TYLENOL Take 1,000 mg by mouth every 8 (eight) hours as needed for mild pain.   ARTIFICIAL TEARS OP Place 1 drop into both eyes daily as needed (dry eyes).   aspirin 81 MG EC tablet Take 1 tablet (81 mg total) by mouth daily. What changed: when to take this   escitalopram 10 MG tablet Commonly known as: LEXAPRO Take 1 tablet (10 mg total) by mouth daily.   fluticasone 50 MCG/ACT nasal spray Commonly known as: FLONASE Place 1 spray into both nostrils daily.   losartan 25 MG tablet Commonly known as: COZAAR Take 1 tablet (25 mg total) by mouth daily. What changed:  medication strength how much to take   lovastatin 40 MG tablet Commonly known as: MEVACOR Take 40 mg by mouth daily.   methocarbamol 500 MG tablet Commonly known as: ROBAXIN Take 2 tablets (1,000 mg total) by mouth every 8 (eight) hours.   metoprolol tartrate 25 MG tablet Commonly known as: LOPRESSOR Take 0.5 tablets (12.5 mg total) by mouth 2 (two) times daily. What changed: how much to take   OXcarbazepine 150 MG tablet Commonly known as: TRILEPTAL Take 1 tablet (150 mg  total) by mouth 2 (two) times daily.   oxyCODONE 5 MG/5ML solution Commonly known as: ROXICODONE Take 5-10 mLs (5-10 mg total) by mouth every 3 (three) hours as needed for moderate pain or severe pain (5mg  for moderate pain, 10mg  for severe pain).   pantoprazole 40 MG tablet Commonly known as: PROTONIX Take 1 tablet (40 mg total) by mouth daily.   polyethylene glycol 17 g packet Commonly known as: MIRALAX / GLYCOLAX Take 17 g by mouth daily.   prenatal multivitamin Tabs tablet Take 1 tablet by mouth every morning.   senna-docusate 8.6-50 MG tablet Commonly known as: Senokot-S Take 1 tablet by mouth at bedtime.   vitamin B-12 100 MCG tablet Commonly known as: CYANOCOBALAMIN Take 100 mcg by mouth daily.       Allergies  Allergen Reactions   Cymbalta [Duloxetine Hcl] Other (See Comments)    Made her very sick (flu-like symptoms)   Lipitor [Atorvastatin] Other (See Comments)    Muscle pain in legs   Other Other (See Comments)    Other reaction(s): OPIOID - analgesic unknown reaction   Amlodipine Besylate-Valsartan Rash   Codeine Rash   Hydrocodone-Acetaminophen Rash   Tape Other (See Comments)    Pulled skin off. Paper tape is ok.       The  results of significant diagnostics from this hospitalization (including imaging, microbiology, ancillary and laboratory) are listed below for reference.    Significant Diagnostic Studies: CT ABDOMEN PELVIS WO CONTRAST  Result Date: 03/18/2021 CLINICAL DATA:  Suspect retroperitoneal hematoma. EXAM: CT ABDOMEN AND PELVIS WITHOUT CONTRAST TECHNIQUE: Multidetector CT imaging of the abdomen and pelvis was performed following the standard protocol without IV contrast. COMPARISON:  03/17/2021 FINDINGS: Lower chest: Atelectasis noted dependent right lung base. Hepatobiliary: No focal abnormality in the liver on this study without intravenous contrast. Gallbladder is surgically absent. No intrahepatic or extrahepatic biliary dilation. Pancreas:  No focal mass lesion. No dilatation of the main duct. No intraparenchymal cyst. No peripancreatic edema. Spleen: No splenomegaly. No focal mass lesion. Adrenals/Urinary Tract: No adrenal nodule or mass. Several cysts again noted right kidney measuring up to approximately 4.9 cm. Left kidney unremarkable on noncontrast imaging. No evidence for hydroureter. The urinary bladder appears normal for the degree of distention. Stomach/Bowel: Stomach is unremarkable. No gastric wall thickening. No evidence of outlet obstruction. Duodenum is normally positioned as is the ligament of Treitz. No small bowel wall thickening. No small bowel dilatation. The terminal ileum is normal. The appendix is normal. No gross colonic mass. No colonic wall thickening. Vascular/Lymphatic: There is moderate calcified aortic atherosclerosis without aneurysm. There is no gastrohepatic or hepatoduodenal ligament lymphadenopathy. No retroperitoneal or mesenteric lymphadenopathy. No pelvic sidewall lymphadenopathy. Reproductive: Uterus surgically absent.  There is no adnexal mass. Other: No substantial intraperitoneal free fluid. Musculoskeletal: Fixation hardware noted left proximal femur. Right sacral fracture extending through the SI joint into the posterior iliac bone is associated with fractures of the right superior and inferior pubic rami. Diffuse degenerative disc disease noted in the lower thoracic and lumbar spine. Slight interval increase in edema/hemorrhage in the presacral space. The hemorrhage seen previously in the right pelvic sidewall and space of Retzius anterior to the bladder has decreased in the interval. IMPRESSION: 1. Right sacral fracture extending through the SI joint into the posterior iliac bone is associated with fractures of the right superior and inferior pubic rami. No substantial change 2. No new evidence for retroperitoneal hematoma. There is some increase in edema/hemorrhage in the presacral space, potentially  representing dependent migration of blood products from before as the right pelvic sidewall and perivesical edema/hemorrhage has decreased since the prior. 3. Right renal cysts. 4. Aortic Atherosclerosis (ICD10-I70.0). Electronically Signed   By: Misty Stanley M.D.   On: 03/18/2021 18:44   DG Shoulder Right  Result Date: 03/16/2021 CLINICAL DATA:  Pain and limited range of motion after a fall. EXAM: RIGHT SHOULDER - 2+ VIEW COMPARISON:  03/05/2018 FINDINGS: Acute comminuted fractures involving the right humeral head and neck. Fracture lines extend across the greater tuberosity with mild displacement of the tuberosity fragment. Fracture fragments are impacted. No glenohumeral dislocation. Degenerative changes in the glenohumeral and acromioclavicular joints. Increase of the subacromial space likely represents an effusion. IMPRESSION: Acute comminuted fractures of the right humeral head and neck. Electronically Signed   By: Lucienne Capers M.D.   On: 03/16/2021 22:59   CT HEAD WO CONTRAST  Result Date: 03/17/2021 CLINICAL DATA:  Mechanical fall.  Head trauma. EXAM: CT HEAD WITHOUT CONTRAST CT CERVICAL SPINE WITHOUT CONTRAST TECHNIQUE: Multidetector CT imaging of the head and cervical spine was performed following the standard protocol without intravenous contrast. Multiplanar CT image reconstructions of the cervical spine were also generated. COMPARISON:  MRI brain 06/23/2017 FINDINGS: CT HEAD FINDINGS Brain: No evidence of acute infarction, hemorrhage, hydrocephalus,  extra-axial collection or mass lesion/mass effect. Mild cerebral atrophy. Patchy low-attenuation changes in the deep white matter consistent with small vessel ischemia. 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 Vascular: Moderate intracranial arterial vascular calcifications. Skull: Calvarium appears intact. Sinuses/Orbits: Paranasal sinuses and mastoid air cells are clear. Other: None. CT CERVICAL SPINE FINDINGS Alignment: Straightening of  usual cervical lordosis is likely positional or degenerative. No anterior subluxations. Normal alignment of the facet joints. C1-2 articulation appears intact. Head tilt stores the right, likely positional but could indicate muscle spasm. Skull base and vertebrae: No acute fracture. No primary bone lesion or focal pathologic process. Soft tissues and spinal canal: No prevertebral fluid or swelling. No visible canal hematoma. Disc levels: Degenerative changes throughout with narrowed disc spaces and endplate osteophyte formation. Degenerative changes in the facet joints. Upper chest: Lung apices are clear. Other: None. IMPRESSION: 1. No acute intracranial abnormalities. Mild cerebral atrophy and small vessel ischemic changes. 2. Nonspecific straightening of usual cervical lordosis. Diffuse degenerative change. No acute displaced fractures identified. Electronically Signed   By: Lucienne Capers M.D.   On: 03/17/2021 00:59   CT CERVICAL SPINE WO CONTRAST  Result Date: 03/17/2021 CLINICAL DATA:  Mechanical fall.  Head trauma. EXAM: CT HEAD WITHOUT CONTRAST CT CERVICAL SPINE WITHOUT CONTRAST TECHNIQUE: Multidetector CT imaging of the head and cervical spine was performed following the standard protocol without intravenous contrast. Multiplanar CT image reconstructions of the cervical spine were also generated. COMPARISON:  MRI brain 06/23/2017 FINDINGS: CT HEAD FINDINGS Brain: No evidence of acute infarction, hemorrhage, hydrocephalus, extra-axial collection or mass lesion/mass effect. Mild cerebral atrophy. Patchy low-attenuation changes in the deep white matter consistent with small vessel ischemia. 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 Vascular: Moderate intracranial arterial vascular calcifications. Skull: Calvarium appears intact. Sinuses/Orbits: Paranasal sinuses and mastoid air cells are clear. Other: None. CT CERVICAL SPINE FINDINGS Alignment: Straightening of usual cervical lordosis is likely  positional or degenerative. No anterior subluxations. Normal alignment of the facet joints. C1-2 articulation appears intact. Head tilt stores the right, likely positional but could indicate muscle spasm. Skull base and vertebrae: No acute fracture. No primary bone lesion or focal pathologic process. Soft tissues and spinal canal: No prevertebral fluid or swelling. No visible canal hematoma. Disc levels: Degenerative changes throughout with narrowed disc spaces and endplate osteophyte formation. Degenerative changes in the facet joints. Upper chest: Lung apices are clear. Other: None. IMPRESSION: 1. No acute intracranial abnormalities. Mild cerebral atrophy and small vessel ischemic changes. 2. Nonspecific straightening of usual cervical lordosis. Diffuse degenerative change. No acute displaced fractures identified. Electronically Signed   By: Lucienne Capers M.D.   On: 03/17/2021 00:59   CT CHEST ABDOMEN PELVIS W CONTRAST  Result Date: 03/17/2021 CLINICAL DATA:  Mechanical fall.  Right hip pain. EXAM: CT CHEST, ABDOMEN, AND PELVIS WITH CONTRAST TECHNIQUE: Multidetector CT imaging of the chest, abdomen and pelvis was performed following the standard protocol during bolus administration of intravenous contrast. CONTRAST:  14mL OMNIPAQUE IOHEXOL 300 MG/ML  SOLN COMPARISON:  Chest radiograph 03/16/2021 FINDINGS: CT CHEST FINDINGS Cardiovascular: Normal heart size. No pericardial effusions. Coronary artery, aortic, and aortic valve calcifications. Normal caliber thoracic aorta. No dissection. Great vessel origins are patent. Mediastinum/Nodes: Esophagus is decompressed. No significant lymphadenopathy. Thyroid gland is unremarkable. No abnormal mediastinal gas or fluid collections. Lungs/Pleura: Atelectasis in the right lung base. Lungs are otherwise clear and expanded. No pleural effusions. No pneumothorax. Airways are patent. Musculoskeletal: Comminuted and displaced fractures of the right humeral  head and neck.  No glenohumeral dislocation. Right shoulder effusion. Intramuscular lipoma in the posterior shoulder musculature. Large cystic collections anterior to the left shoulder and in the subcoracoid region likely bursal collections. Prominent degenerative changes in the left shoulder. Normal alignment of the thoracic spine. Diffuse degenerative changes. No acute compression deformities identified. Visualized ribs appear intact. No sternal depression. CT ABDOMEN PELVIS FINDINGS Hepatobiliary: No focal liver abnormality is seen. Status post cholecystectomy. No biliary dilatation. Pancreas: Unremarkable. No pancreatic ductal dilatation or surrounding inflammatory changes. Spleen: Normal in size without focal abnormality. Adrenals/Urinary Tract: No adrenal gland nodules. Bilateral renal cysts. Nephrograms are homogeneous and normal. No hydronephrosis or hydroureter. Bladder is displaced towards the left by right obturator/ileus psoas hematoma. Stomach/Bowel: Stomach is within normal limits. Appendix appears normal. No evidence of bowel wall thickening, distention, or inflammatory changes. Vascular/Lymphatic: Aortic atherosclerosis. No enlarged abdominal or pelvic lymph nodes. Reproductive: Status post hysterectomy. No adnexal masses. Other: No free air or free fluid in the abdomen. Musculoskeletal: Degenerative changes in the lumbar spine. Normal alignment. No vertebral compression deformities. Acute linear nondisplaced fractures are demonstrated in the right sacral ala extending through the SI joint into the posterosuperior right iliac bone. The SI joint is not significantly widened. Mildly displaced fractures of the right superior and inferior pubic rami. Associated hematomas in the right obturator and ileus psoas regions with displacement of the bladder towards the right. Degenerative changes in the hips. Previous internal fixation of the left hip. No hip fractures are demonstrated. Lumbar scoliosis convex towards the left.  IMPRESSION: 1. No evidence of mediastinal or pulmonary parenchymal injury. No evidence of solid abdominal organ injury or bowel perforation. 2. Comminuted fractures of the proximal right humerus. 3. Nondisplaced fractures of the right sacral ala extending through the SI joint into the posterosuperior right iliac bone. No widening of the sacral iliac joint. 4. Mildly displaced fractures of the right superior and inferior pubic rami with associated hematomas in the iliopsoas and obturator regions. Hematoma causes displacement bladder towards the left. 5. Degenerative changes in the left shoulder with large bursal collections. 6. Right lung base atelectasis. 7. Aortic atherosclerosis. Electronically Signed   By: Lucienne Capers M.D.   On: 03/17/2021 01:15   DG Chest Portable 1 View  Result Date: 03/16/2021 CLINICAL DATA:  Trauma.  Mechanical fall. EXAM: PORTABLE CHEST 1 VIEW COMPARISON:  Chest x-ray 09/13/2020. FINDINGS: The heart size and mediastinal contours are unchanged. Aortic calcification. Similar-appearing elevated right hemidiaphragm. Right base atelectasis. No focal consolidation. No pulmonary edema. No pleural effusion. No pneumothorax. Comminuted right humeral fracture new from 09/13/2020. IMPRESSION: 1. Comminuted right humeral fracture new from 09/13/2020. please see separately dictated dedicated right shoulder radiographs 03/16/2021. 2. No acute cardiopulmonary abnormality. Electronically Signed   By: Iven Finn M.D.   On: 03/16/2021 23:30   DG Humerus Right  Result Date: 03/16/2021 CLINICAL DATA:  Pain and limited range of motion after a fall. EXAM: RIGHT HUMERUS - 2+ VIEW COMPARISON:  03/05/2018 FINDINGS: Acute comminuted fractures of the right humeral head and neck with superior displacement and impaction of the distal fracture fragments resulting in varus angulation. The midshaft and distal right humerus are intact. Diffuse soft tissue swelling. IMPRESSION: Acute comminuted fractures of  the right humeral head and neck with varus angulation. Electronically Signed   By: Lucienne Capers M.D.   On: 03/16/2021 22:58   DG Hip Unilat With Pelvis 2-3 Views Right  Result Date: 03/16/2021 CLINICAL DATA:  Pain and limited range of motion  after a fall EXAM: DG HIP (WITH OR WITHOUT PELVIS) 2-3V RIGHT COMPARISON:  None. FINDINGS: Degenerative changes in the lower lumbar spine and hips. Postoperative internal fixation of the left proximal femur. Acute appearing displaced fractures of the right superior and inferior pubic rami. No right hip fracture identified. SI joints and symphysis pubis are not displaced. IMPRESSION: Acute displaced fractures of the right superior and inferior pubic rami. Degenerative changes in the lower lumbar spine and hips. Electronically Signed   By: Lucienne Capers M.D.   On: 03/16/2021 22:57    Microbiology: Recent Results (from the past 240 hour(s))  Resp Panel by RT-PCR (Flu A&B, Covid) Nasopharyngeal Swab     Status: None   Collection Time: 03/16/21 11:55 PM   Specimen: Nasopharyngeal Swab; Nasopharyngeal(NP) swabs in vial transport medium  Result Value Ref Range Status   SARS Coronavirus 2 by RT PCR NEGATIVE NEGATIVE Final    Comment: (NOTE) SARS-CoV-2 target nucleic acids are NOT DETECTED.  The SARS-CoV-2 RNA is generally detectable in upper respiratory specimens during the acute phase of infection. The lowest concentration of SARS-CoV-2 viral copies this assay can detect is 138 copies/mL. A negative result does not preclude SARS-Cov-2 infection and should not be used as the sole basis for treatment or other patient management decisions. A negative result may occur with  improper specimen collection/handling, submission of specimen other than nasopharyngeal swab, presence of viral mutation(s) within the areas targeted by this assay, and inadequate number of viral copies(<138 copies/mL). A negative result must be combined with clinical observations,  patient history, and epidemiological information. The expected result is Negative.  Fact Sheet for Patients:  EntrepreneurPulse.com.au  Fact Sheet for Healthcare Providers:  IncredibleEmployment.be  This test is no t yet approved or cleared by the [place] FDA and  has been authorized for detection and/or diagnosis of SARS-CoV-2 by FDA under an Emergency Use Authorization (EUA). This EUA will remain  in effect (meaning this test can be used) for the duration of the COVID-19 declaration under Section 564(b)(1) of the Act, 21 U.S.C.section 360bbb-3(b)(1), unless the authorization is terminated  or revoked sooner.       Influenza A by PCR NEGATIVE NEGATIVE Final   Influenza B by PCR NEGATIVE NEGATIVE Final    Comment: (NOTE) The Xpert Xpress SARS-CoV-2/FLU/RSV plus assay is intended as an aid in the diagnosis of influenza from Nasopharyngeal swab specimens and should not be used as a sole basis for treatment. Nasal washings and aspirates are unacceptable for Xpert Xpress SARS-CoV-2/FLU/RSV testing.  Fact Sheet for Patients: EntrepreneurPulse.com.au  Fact Sheet for Healthcare Providers: IncredibleEmployment.be  This test is not yet approved or cleared by the [place] FDA and has been authorized for detection and/or diagnosis of SARS-CoV-2 by FDA under an Emergency Use Authorization (EUA). This EUA will remain in effect (meaning this test can be used) for the duration of the COVID-19 declaration under Section 564(b)(1) of the Act, 21 U.S.C. section 360bbb-3(b)(1), unless the authorization is terminated or revoked.  Performed at [facility], [place] [address], [place], [place] [address]   SARS CORONAVIRUS 2 (TAT 6-24 HRS) Nasopharyngeal Nasopharyngeal Swab     Status: None   Collection Time: 03/22/21  5:24 PM   Specimen: Nasopharyngeal Swab  Result Value Ref Range Status   SARS Coronavirus 2  NEGATIVE NEGATIVE Final    Comment: (NOTE) SARS-CoV-2 target nucleic acids are NOT DETECTED.  The SARS-CoV-2 RNA is generally detectable in upper and lower respiratory specimens during the  acute phase of infection. Negative results do not preclude SARS-CoV-2 infection, do not rule out co-infections with other pathogens, and should not be used as the sole basis for treatment or other patient management decisions. Negative results must be combined with clinical observations, patient history, and epidemiological information. The expected result is Negative.  Fact Sheet for Patients: SugarRoll.be  Fact Sheet for Healthcare Providers: https://www.woods-mathews.com/  This test is not yet approved or cleared by the [place] FDA and  has been authorized for detection and/or diagnosis of SARS-CoV-2 by FDA under an Emergency Use Authorization (EUA). This EUA will remain  in effect (meaning this test can be used) for the duration of the COVID-19 declaration under Se ction 564(b)(1) of the Act, 21 U.S.C. section 360bbb-3(b)(1), unless the authorization is terminated or revoked sooner.  Performed at [facility], [place] [address], [place], [place] [address]      Labs: Basic Metabolic Panel: Recent Labs  Lab 03/17/21 0435 03/18/21 0711 03/19/21 0051 03/20/21 0056 03/22/21 0106  NA 135 135 130* 130* 131*  K 4.4 4.4 4.7 4.6 4.1  CL 99 100 99 96* 95*  CO2 29 29 26 30 31   GLUCOSE 165* 111* 109* 111* 101*  BUN 15 17 15 10 8   CREATININE 0.60 0.79 0.68 0.57 0.49  CALCIUM 9.0 8.7* 8.5* 8.5* 8.4*    Liver Function Tests: Recent Labs  Lab 03/20/21 0056 03/22/21 0106  AST 17 22  ALT 15 16  ALKPHOS 34* 42  BILITOT 1.2 1.3*  PROT 4.6* 4.7*  ALBUMIN 2.4* 2.4*    No results for input(s): LIPASE, AMYLASE in the last 168 hours. No results for input(s): AMMONIA in the last 168 hours. CBC: Recent Labs  Lab 03/16/21 2203  03/17/21 0435 03/18/21 1411 03/18/21 2108 03/19/21 0051 03/20/21 0056 03/22/21 0106  WBC 14.3*   < > 8.2 8.0 8.8 7.1 8.1  NEUTROABS 11.4*  --   --   --   --   --  4.9  HGB 13.2   < > 8.5* 8.9* 8.7* 8.8* 8.9*  HCT 39.7   < > 26.0* 26.5* 26.4* 25.7* 26.2*  MCV 96.1   < > 97.7 97.4 98.9 95.9 94.9  PLT 221   < > 168 148* 156 152 225   < > = values in this interval not displayed.    Cardiac Enzymes: No results for input(s): CKTOTAL, CKMB, CKMBINDEX, TROPONINI in the last 168 hours. BNP: BNP (last 3 results) No results for input(s): BNP in the last 8760 hours.  ProBNP (last 3 results) No results for input(s): PROBNP in the last 8760 hours.  CBG: No results for input(s): GLUCAP in the last 168 hours.     Signed:  Nita Sells MD   [facility] 03/23/2021, 1:29 PM

## 2021-03-23 NOTE — Progress Notes (Signed)
Patient refuses to go to [facility]. Stated that she would like to go to [facility]. Charge nurse and MD made aware.

## 2021-03-23 NOTE — Care Management Important Message (Signed)
Important Message  Patient Details  Name: Terri Liu MRN: [id number] Date of Birth: [birth year]   Medicare Important Message Given:  Yes     Vikrant Pryce Montine Circle 03/23/2021, 3:29 PM

## 2021-03-23 NOTE — Plan of Care (Signed)
  Problem: Education: Goal: Knowledge of General Education information will improve Description: Including pain rating scale, medication(s)/side effects and non-pharmacologic comfort measures Outcome: Progressing   Problem: Health Behavior/Discharge Planning: Goal: Ability to manage health-related needs will improve Outcome: Progressing   Problem: Clinical Measurements: Goal: Ability to maintain clinical measurements within normal limits will improve Outcome: Progressing Goal: Will remain free from infection Outcome: Progressing Goal: Diagnostic test results will improve Outcome: Progressing Goal: Respiratory complications will improve Outcome: Progressing Goal: Cardiovascular complication will be avoided Outcome: Progressing   Problem: Clinical Measurements: Goal: Respiratory complications will improve Outcome: Progressing   Problem: Nutrition: Goal: Adequate nutrition will be maintained Outcome: Progressing   Problem: Coping: Goal: Level of anxiety will decrease Outcome: Progressing

## 2021-03-23 NOTE — TOC Progression Note (Addendum)
Transition of Care [facility]) - Progression Note    Patient Details  Name: MARCEE JACOBS MRN: [id number] Date of Birth: [birth year]  Transition of Care [facility]) CM/SW [place], [place] Phone Number: 03/23/2021, 11:33 AM  Clinical Narrative:     CSW spoke to pt at bedside. Pt stated she no longer wanted to go to [facility] and has decided on [facility]. [facility] called [facility] to see if they an accept pt today. CSW requested a call ack. CSW called [facility] to update facility choice. Insurance Josem Kaufmann is still pending.  2:49pm- [facility] can accept pt tomorrow morning. [facility] insurance Josem Kaufmann is currently pending.   Expected Discharge Plan: Skilled Nursing Facility Barriers to Discharge: Ship broker, SNF Pending bed offer, Continued Medical Work up  Expected Discharge Plan and Services Expected Discharge Plan: [place] Choice: [place] arrangements for the past 2 months: Single Family Home Expected Discharge Date: 03/22/21                                     Social Determinants of Health (SDOH) Interventions    Readmission Risk Interventions No flowsheet data found.  Emeterio Reeve, Latanya Presser, [place] Social Worker [phone]

## 2021-03-23 NOTE — Progress Notes (Signed)
Occupational Therapy Treatment Patient Details Name: Terri Liu MRN: [id number] DOB: Jun 01, 1934 Today's Date: 03/23/2021    History of present illness Pt adm 7/5 after fall causing rt humeral head/neck  fx and sacral ala and superior and inferior pubic rami fx on the rt. PMH - HTN, post herpetic neuralgia, arthritis, lt femur fx, rt TKR, lt TKR, covid   OT comments  Pt making progress with functional goals. Pt in bed upon arrival requesting to use BSC. Session focused on bed mobility to sit EOB, sit - stand for SPTs to [facility], toileting tasks, UB dressing seated on BSC and SPT to recliner, grooming tasks seated. OT will continue to follow acutely to maximize level of function and safety  Follow Up Recommendations  SNF;Supervision/Assistance - 24 hour    Equipment Recommendations  Other (comment) (TBD at SNF)    Recommendations for Other Services      Precautions / Restrictions Precautions Precautions: Fall;Shoulder Required Braces or Orthoses: Sling Restrictions Weight Bearing Restrictions: Yes RUE Weight Bearing: Non weight bearing RLE Weight Bearing: Weight bearing as tolerated       Mobility Bed Mobility Overal bed mobility: Needs Assistance Bed Mobility: Supine to Sit     Supine to sit: Min assist;HOB elevated     General bed mobility comments: Cues to manage LEs towards EOB, min A to elevate trunk, verbal cues pt to place L elbow under trunk to push up. HOB elevated.    Transfers Overall transfer level: Needs assistance Equipment used: 1 person hand held assist Transfers: Sit to/from [facility] Sit to Stand: Max assist;Mod assist Stand pivot transfers: Mod assist       General transfer comment: sit - stand from EOB max A, mod A to pivot to [facility]    Balance Overall balance assessment: Needs assistance Sitting-balance support: No upper extremity supported;Feet supported Sitting balance-Leahy Scale: Good     Standing balance support: Single  extremity supported;During functional activity Standing balance-Leahy Scale: Poor                             ADL either performed or assessed with clinical judgement   ADL Overall ADL's : Needs assistance/impaired     Grooming: Wash/dry hands;Wash/dry face;Min guard;Sitting           Upper Body Dressing : Minimal assistance;Sitting       Toilet Transfer: Maximal assistance;Moderate assistance;Cueing for safety;Cueing for sequencing;Stand-pivot;BSC   Toileting- Clothing Manipulation and Hygiene: Maximal assistance Toileting - Clothing Manipulation Details (indicate cue type and reason): max A clothing mgt and anterior hygiene     Functional mobility during ADLs: Maximal assistance;Moderate assistance;Cueing for sequencing;Cueing for safety       Vision Baseline Vision/History: Wears glasses Wears Glasses: At all times Patient Visual Report: No change from baseline     Perception     Praxis      Cognition Arousal/Alertness: Awake/alert Behavior During Therapy: WFL for tasks assessed/performed Overall Cognitive Status: Within Functional Limits for tasks assessed                                          Exercises     Shoulder Instructions       General Comments      Pertinent Vitals/ Pain       Pain Assessment: Faces Faces Pain Scale: Hurts even more  Pain Location: RUE and RLE Pain Descriptors / Indicators: Aching;Grimacing;Guarding;Sore Pain Intervention(s): Monitored during session;Repositioned;Premedicated before session  Home Living                                          Prior Functioning/Environment              Frequency  Min 2X/week        Progress Toward Goals  OT Goals(current goals can now be found in the care plan section)  Progress towards OT goals: Progressing toward goals  Acute Rehab OT Goals Patient Stated Goal: get well and go home  Plan Discharge plan remains appropriate     Co-evaluation                 AM-PAC OT "6 Clicks" Daily Activity     Outcome Measure   Help from another person eating meals?: None (set up) Help from another person taking care of personal grooming?: A Little Help from another person toileting, which includes using toliet, bedpan, or urinal?: A Lot Help from another person bathing (including washing, rinsing, drying)?: A Lot Help from another person to put on and taking off regular upper body clothing?: A Little Help from another person to put on and taking off regular lower body clothing?: A Lot 6 Click Score: 16    End of Session Equipment Utilized During Treatment: Gait belt;Other (comment) (BSC)  OT Visit Diagnosis: Unsteadiness on feet (R26.81);Other abnormalities of gait and mobility (R26.89);Muscle weakness (generalized) (M62.81)   Activity Tolerance Patient limited by fatigue   Patient Left with call bell/phone within reach;in chair;with chair alarm set   Nurse Communication          Time: [phone] OT Time Calculation (min): 28 min  Charges: OT General Charges $OT Visit: 1 Visit OT Treatments $Self Care/Home Management : 8-22 mins $Therapeutic Activity: 8-22 mins     Britt Bottom 03/23/2021, 11:23 AM

## 2021-03-23 NOTE — Progress Notes (Signed)
Patient stable for d/c See updated summary  Verneita Griffes, MD [facility] 1:28 PM

## 2021-03-23 NOTE — Care Management Important Message (Signed)
Important Message  Patient Details  Name: Terri Liu MRN: [id number] Date of Birth: [birth year]   Medicare Important Message Given:  Yes     Everly Rubalcava Montine Circle 03/23/2021, 3:30 PM

## 2021-03-24 NOTE — TOC Progression Note (Signed)
Transition of Care [facility]) - Progression Note    Patient Details  Name: Terri Liu MRN: [id number] Date of Birth: [birth year]  Transition of Care [facility]) CM/SW [place], [place] Phone Number: 03/24/2021, 4:09 PM  Clinical Narrative:     CSW received insurance auth for SNF, number [id number], auth for ptar is [phone].   [facility] is not able to accept pt today, [facility] can accept her tomorrow.  Expected Discharge Plan: Skilled Nursing Facility Barriers to Discharge: Ship broker, SNF Pending bed offer, Continued Medical Work up  Expected Discharge Plan and Services Expected Discharge Plan: [place] Choice: [place] arrangements for the past 2 months: Single Family Home Expected Discharge Date: 03/23/21                                     Social Determinants of Health (SDOH) Interventions    Readmission Risk Interventions No flowsheet data found.  Emeterio Reeve, Latanya Presser, [place] Social Worker [phone]

## 2021-03-24 NOTE — Progress Notes (Signed)
[facility] PROGRESS NOTE    Progress Note  Terri Liu  TMH:[id number] DOB: 14-Oct-1933 DOA: 03/16/2021 PCP: Leeroy Cha, MD     Brief Narrative:   Terri Liu is an 87 y.o. female HTN, postherpetic neuralgia, HLD, left hip fracture 01/2017 COVID-pneumonia 09/14/2020 status post remdesivir, steroids HFpEF/HFrEF-Echo 40-45% 12/31/2019 + chronic LBBB. Orthopedics and trauma were consulted-patient deemed her nonoperative candidate     Assessment/Plan:   Accidental fall Closed fracture of proximal end of right humerus, unspecified fracture morphology, initial encounter: Orthopedic surgery was consulted recommended conservative management continue narcotics for pain and swelling. Can resume aspirin as an outpatient.  Abdominal discomfort nausea: Started on Protonix  Iliopsoas hematoma with bilateral displacement: Aspirin as an outpatient.  Chronic systolic and diastolic heart failure, Continue metoprolol losartan.  Mild leukocytosis likely stress demargination due to fractures:  Normocytic anemia: Follow-up with PCP as an outpatient.  Depression: Continue Trileptal.   DVT prophylaxis: none Family Communication:none Status is: Inpatient  Remains inpatient appropriate because:Hemodynamically unstable  Dispo:  Patient From: Home  Planned Disposition: [place]  Medically stable for discharge: No          Code Status:     Code Status Orders  (From admission, onward)           Start     Ordered   03/17/21 0202  Do not attempt resuscitation (DNR)  Continuous       Question Answer Comment  In the event of cardiac or respiratory ARREST Do not call a "code blue"   In the event of cardiac or respiratory ARREST Do not perform Intubation, CPR, defibrillation or ACLS   In the event of cardiac or respiratory ARREST Use medication by any route, position, wound care, and other measures to relive pain and suffering. May use oxygen,  suction and manual treatment of airway obstruction as needed for comfort.      03/17/21 0204           Code Status History     Date Active Date Inactive Code Status Order ID Comments User Context   09/11/2020 1852 09/14/2020 2301 Full Code [id number]  Cristal Deer, MD ED   01/11/2017 2141 01/14/2017 1844 Full Code [id number]  Etta Quill, DO ED   01/27/2015 1904 01/28/2015 1210 Full Code [id number]  Hayden Pedro, MD Inpatient      Advance Directive Documentation    Flowsheet Row Most Recent Value  Type of Advance Directive Healthcare Power of Attorney  Pre-existing out of facility DNR order (yellow form or pink MOST form) --  "MOST" Form in Place? --         IV Access:   Peripheral IV   Procedures and diagnostic studies:   No results found.   Medical Consultants:   None.   Subjective:    Terri Liu relates her pain is controlled.  Objective:    Vitals:   03/24/21 0107 03/24/21 0500 03/24/21 0550 03/24/21 0745  BP: (!) 132/51  (!) 172/63 (!) 129/53  Pulse: 70  82 96  Resp: 18  18 18   Temp: 97.7 F (36.5 C)  97.7 F (36.5 C) 98.3 F (36.8 C)  TempSrc: Oral  Oral Oral  SpO2: 98%  96% 95%  Weight:  81.1 kg    Height:       SpO2: 95 % O2 Flow Rate (L/min): 1 L/min   Intake/Output Summary (Last 24 hours) at 03/24/2021 0817 Last data filed at 03/24/2021 0550 Gross  per 24 hour  Intake 240 ml  Output 1350 ml  Net -1110 ml   Filed Weights   03/18/21 0606 03/23/21 0500 03/24/21 0500  Weight: 79.8 kg 81.4 kg 81.1 kg    Exam: General exam: In no acute distress. Respiratory system: Good air movement and clear to auscultation. Cardiovascular system: S1 & S2 heard, RRR. No JVD. Gastrointestinal system: Abdomen is nondistended, soft and nontender.  Extremities: No pedal edema. Skin: No rashes, lesions or ulcers  Data Reviewed:    Labs: Basic Metabolic Panel: Recent Labs  Lab 03/18/21 0711 03/19/21 0051 03/20/21 0056 03/22/21 0106   NA 135 130* 130* 131*  K 4.4 4.7 4.6 4.1  CL 100 99 96* 95*  CO2 29 26 30 31   GLUCOSE 111* 109* 111* 101*  BUN 17 15 10 8   CREATININE 0.79 0.68 0.57 0.49  CALCIUM 8.7* 8.5* 8.5* 8.4*   GFR Estimated Creatinine Clearance: 54 mL/min (by C-G formula based on SCr of 0.49 mg/dL). Liver Function Tests: Recent Labs  Lab 03/20/21 0056 03/22/21 0106  AST 17 22  ALT 15 16  ALKPHOS 34* 42  BILITOT 1.2 1.3*  PROT 4.6* 4.7*  ALBUMIN 2.4* 2.4*   No results for input(s): LIPASE, AMYLASE in the last 168 hours. No results for input(s): AMMONIA in the last 168 hours. Coagulation profile No results for input(s): INR, PROTIME in the last 168 hours. COVID-19 Labs  No results for input(s): DDIMER, FERRITIN, LDH, CRP in the last 72 hours.  Lab Results  Component Value Date   SARSCOV2NAA NEGATIVE 03/22/2021   SARSCOV2NAA NEGATIVE 03/16/2021   SARSCOV2NAA POSITIVE (A) 09/11/2020    CBC: Recent Labs  Lab 03/18/21 1411 03/18/21 2108 03/19/21 0051 03/20/21 0056 03/22/21 0106  WBC 8.2 8.0 8.8 7.1 8.1  NEUTROABS  --   --   --   --  4.9  HGB 8.5* 8.9* 8.7* 8.8* 8.9*  HCT 26.0* 26.5* 26.4* 25.7* 26.2*  MCV 97.7 97.4 98.9 95.9 94.9  PLT 168 148* 156 152 225   Cardiac Enzymes: No results for input(s): CKTOTAL, CKMB, CKMBINDEX, TROPONINI in the last 168 hours. BNP (last 3 results) No results for input(s): PROBNP in the last 8760 hours. CBG: No results for input(s): GLUCAP in the last 168 hours. D-Dimer: No results for input(s): DDIMER in the last 72 hours. Hgb A1c: No results for input(s): HGBA1C in the last 72 hours. Lipid Profile: No results for input(s): CHOL, HDL, LDLCALC, TRIG, CHOLHDL, LDLDIRECT in the last 72 hours. Thyroid function studies: No results for input(s): TSH, T4TOTAL, T3FREE, THYROIDAB in the last 72 hours.  Invalid input(s): FREET3 Anemia work up: No results for input(s): VITAMINB12, FOLATE, FERRITIN, TIBC, IRON, RETICCTPCT in the last 72 hours. Sepsis  Labs: Recent Labs  Lab 03/18/21 2108 03/19/21 0051 03/20/21 0056 03/22/21 0106  WBC 8.0 8.8 7.1 8.1   Microbiology Recent Results (from the past 240 hour(s))  Resp Panel by RT-PCR (Flu A&B, Covid) Nasopharyngeal Swab     Status: None   Collection Time: 03/16/21 11:55 PM   Specimen: Nasopharyngeal Swab; Nasopharyngeal(NP) swabs in vial transport medium  Result Value Ref Range Status   SARS Coronavirus 2 by RT PCR NEGATIVE NEGATIVE Final    Comment: (NOTE) SARS-CoV-2 target nucleic acids are NOT DETECTED.  The SARS-CoV-2 RNA is generally detectable in upper respiratory specimens during the acute phase of infection. The lowest concentration of SARS-CoV-2 viral copies this assay can detect is 138 copies/mL. A negative result does not preclude SARS-Cov-2  infection and should not be used as the sole basis for treatment or other patient management decisions. A negative result may occur with  improper specimen collection/handling, submission of specimen other than nasopharyngeal swab, presence of viral mutation(s) within the areas targeted by this assay, and inadequate number of viral copies(<138 copies/mL). A negative result must be combined with clinical observations, patient history, and epidemiological information. The expected result is Negative.  Fact Sheet for Patients:  EntrepreneurPulse.com.au  Fact Sheet for Healthcare Providers:  IncredibleEmployment.be  This test is no t yet approved or cleared by the [place] FDA and  has been authorized for detection and/or diagnosis of SARS-CoV-2 by FDA under an Emergency Use Authorization (EUA). This EUA will remain  in effect (meaning this test can be used) for the duration of the COVID-19 declaration under Section 564(b)(1) of the Act, 21 U.S.C.section 360bbb-3(b)(1), unless the authorization is terminated  or revoked sooner.       Influenza A by PCR NEGATIVE NEGATIVE Final   Influenza  B by PCR NEGATIVE NEGATIVE Final    Comment: (NOTE) The Xpert Xpress SARS-CoV-2/FLU/RSV plus assay is intended as an aid in the diagnosis of influenza from Nasopharyngeal swab specimens and should not be used as a sole basis for treatment. Nasal washings and aspirates are unacceptable for Xpert Xpress SARS-CoV-2/FLU/RSV testing.  Fact Sheet for Patients: EntrepreneurPulse.com.au  Fact Sheet for Healthcare Providers: IncredibleEmployment.be  This test is not yet approved or cleared by the [place] FDA and has been authorized for detection and/or diagnosis of SARS-CoV-2 by FDA under an Emergency Use Authorization (EUA). This EUA will remain in effect (meaning this test can be used) for the duration of the COVID-19 declaration under Section 564(b)(1) of the Act, 21 U.S.C. section 360bbb-3(b)(1), unless the authorization is terminated or revoked.  Performed at [facility], [place] [address] [place], [place] [address]   SARS CORONAVIRUS 2 (TAT 6-24 HRS) Nasopharyngeal Nasopharyngeal Swab     Status: None   Collection Time: 03/22/21  5:24 PM   Specimen: Nasopharyngeal Swab  Result Value Ref Range Status   SARS Coronavirus 2 NEGATIVE NEGATIVE Final    Comment: (NOTE) SARS-CoV-2 target nucleic acids are NOT DETECTED.  The SARS-CoV-2 RNA is generally detectable in upper and lower respiratory specimens during the acute phase of infection. Negative results do not preclude SARS-CoV-2 infection, do not rule out co-infections with other pathogens, and should not be used as the sole basis for treatment or other patient management decisions. Negative results must be combined with clinical observations, patient history, and epidemiological information. The expected result is Negative.  Fact Sheet for Patients: SugarRoll.be  Fact Sheet for Healthcare Providers: https://www.woods-mathews.com/  This test  is not yet approved or cleared by the [place] FDA and  has been authorized for detection and/or diagnosis of SARS-CoV-2 by FDA under an Emergency Use Authorization (EUA). This EUA will remain  in effect (meaning this test can be used) for the duration of the COVID-19 declaration under Se ction 564(b)(1) of the Act, 21 U.S.C. section 360bbb-3(b)(1), unless the authorization is terminated or revoked sooner.  Performed at [facility], [place] [address], [place], [place] [address]      Medications:    acetaminophen  1,000 mg Oral Q6H   cholecalciferol  1,000 Units Oral Daily   enoxaparin (LOVENOX) injection  30 mg Subcutaneous Q12H   escitalopram  10 mg Oral Daily   losartan  25 mg Oral Daily   methocarbamol  1,000 mg Oral Q8H   metoprolol tartrate  12.5 mg Oral BID   multivitamin with minerals  1 tablet Oral Daily   OXcarbazepine  150 mg Oral BID   pantoprazole  40 mg Oral Daily   polyethylene glycol  17 g Oral Daily   pravastatin  40 mg Oral q1800   senna-docusate  1 tablet Oral QHS   vitamin B-12  100 mcg Oral Daily   Continuous Infusions:    LOS: 8 days   Charlynne Cousins  [facility]  03/24/2021, 8:17 AM

## 2021-03-24 NOTE — Progress Notes (Signed)
Physical Therapy Treatment Patient Details Name: Terri Liu MRN: [id number] DOB: [birth year] Today's Date: 03/24/2021    History of Present Illness Pt adm 7/5 after fall causing rt humeral head/neck  fx and sacral ala and superior and inferior pubic rami fx on the rt. PMH - HTN, post herpetic neuralgia, arthritis, lt femur fx, rt TKR, lt TKR, covid    PT Comments    Pt making good progress with mobility, only needing min guard assist for bed mobility with use of bed controls and rails and only needing modAx1 for transfers and several side steps with L UE support. However, she displays poor R weight shifting, impairing her ability to advance her L leg. Focused on lateral weight shifting, with noted improvement. Also, focused on improving AROM/preventing contractures in her R elbow and wrist and improving her lower extremity strength with exercises while sitting. Provided HEP handout. Will continue to follow acutely. Current recommendations remain appropriate.   Follow Up Recommendations  SNF     Equipment Recommendations  Cane;Other (comment) (vs hemiwalker)    Recommendations for Other Services       Precautions / Restrictions Precautions Precautions: Fall Precaution Comments: Following total shoulder precautions until orders are clarified Required Braces or Orthoses: Sling Restrictions Weight Bearing Restrictions: Yes RUE Weight Bearing: Non weight bearing RLE Weight Bearing: Weight bearing as tolerated    Mobility  Bed Mobility Overal bed mobility: Needs Assistance Bed Mobility: Supine to Sit     Supine to sit: HOB elevated;Min guard     General bed mobility comments: Extra time for pt to manage legs off EOB and use L bed rail to pull with L UE to ascend trunk while HOB elevated, min guard.    Transfers Overall transfer level: Needs assistance Equipment used: 1 person hand held assist Transfers: Sit to/from [facility] Sit to Stand: Mod  assist Stand pivot transfers: Mod assist       General transfer comment: Sit to stand from EOB with L HHA, modA to power up to stand. ModA to steady and direct buttocks towards L, intermittent R knee block, to stand step to L to transfer bed > recliner.  Ambulation/Gait Ambulation/Gait assistance: Mod assist Gait Distance (Feet): 2 Feet Assistive device: 1 person hand held assist;Rolling walker (2 wheeled) (L hand on RW only) Gait Pattern/deviations: Step-to pattern;Decreased step length - left;Decreased stance time - right;Decreased weight shift to right;Antalgic Gait velocity: reduced Gait velocity interpretation: <1.31 ft/sec, indicative of household ambulator General Gait Details: Pt with antalgic gait pattern, displaying decreased R weight shift and stance time and thus decreasd L step length. intermittent R knee block for support to allow increased L step length. [place] for stability and guidance. x2 bouts, 1x side stepping to L and 1x weight shifting with L UE on RW attempting shifting anterior <> posterior first but poor R weight shift thus transitioned to lateral weight shifting, improved R weight shifting noted.   Stairs             Wheelchair Mobility    Modified Rankin (Stroke Patients Only)       Balance Overall balance assessment: Needs assistance Sitting-balance support: No upper extremity supported;Feet supported Sitting balance-Leahy Scale: Good     Standing balance support: Single extremity supported Standing balance-Leahy Scale: Poor Standing balance comment: Reliant on UE support and phsycial assistance to stand.  Cognition Arousal/Alertness: Awake/alert Behavior During Therapy: WFL for tasks assessed/performed Overall Cognitive Status: Within Functional Limits for tasks assessed                                        Exercises General Exercises - Lower Extremity Ankle Circles/Pumps:  AROM;Both;10 reps;Seated Straight Leg Raises: Strengthening;Both;10 reps;Seated Shoulder Exercises Elbow Flexion: AROM;Right;10 reps;Seated (supported on pillow) Elbow Extension: AROM;Right;10 reps;Seated (supported on pillow) Wrist Flexion: AROM;Right;10 reps;Seated (supported on pillow) Wrist Extension: AROM;Right;10 reps;Seated (supported on pillow)    General Comments General comments (skin integrity, edema, etc.): Provided HEP handout consisting of: supported AROM elbow flexion/extension, supported wrist flexion/extension AROM, seated marching, and LAQs      Pertinent Vitals/Pain Pain Assessment: Faces Faces Pain Scale: Hurts little more Pain Location: RUE and RLE Pain Descriptors / Indicators: Aching;Grimacing;Guarding;Sore Pain Intervention(s): Limited activity within patient's tolerance;Monitored during session;Repositioned;Ice applied    Home Living                      Prior Function            PT Goals (current goals can now be found in the care plan section) Acute Rehab PT Goals Patient Stated Goal: to go to SNF PT Goal Formulation: With patient Time For Goal Achievement: 04/01/21 Potential to Achieve Goals: Good Progress towards PT goals: Progressing toward goals    Frequency    Min 3X/week      PT Plan Current plan remains appropriate    Co-evaluation              AM-PAC PT "6 Clicks" Mobility   Outcome Measure  Help needed turning from your back to your side while in a flat bed without using bedrails?: A Little Help needed moving from lying on your back to sitting on the side of a flat bed without using bedrails?: A Little Help needed moving to and from a bed to a chair (including a wheelchair)?: A Lot Help needed standing up from a chair using your arms (e.g., wheelchair or bedside chair)?: A Lot Help needed to walk in hospital room?: Total Help needed climbing 3-5 steps with a railing? : Total 6 Click Score: 12    End of Session  Equipment Utilized During Treatment: Gait belt;Other (comment) (sling) Activity Tolerance: Patient tolerated treatment well;Patient limited by fatigue;Patient limited by pain Patient left: with call bell/phone within reach;in chair;with chair alarm set Nurse Communication: Mobility status PT Visit Diagnosis: Other abnormalities of gait and mobility (R26.89);Pain;Unsteadiness on feet (R26.81);Muscle weakness (generalized) (M62.81);Difficulty in walking, not elsewhere classified (R26.2) Pain - Right/Left: Right Pain - part of body: Shoulder;Hip     Time: 2229-7989 PT Time Calculation (min) (ACUTE ONLY): 38 min  Charges:  $Gait Training: 8-22 mins $Therapeutic Exercise: 8-22 mins $Therapeutic Activity: 8-22 mins                     Moishe Spice, PT, DPT Acute Rehabilitation Services  Pager: [phone] Office: [place] 03/24/2021, 5:29 PM

## 2021-03-25 LAB — RESP PANEL BY RT-PCR (FLU A&B, COVID) ARPGX2
Influenza A by PCR: NEGATIVE
Influenza B by PCR: NEGATIVE
SARS Coronavirus 2 by RT PCR: NEGATIVE

## 2021-03-25 MED ORDER — COVID-19 MRNA VACC (MODERNA) 50 MCG/0.25ML IM SUSP
0.2500 mL | Freq: Once | INTRAMUSCULAR | Status: AC
  Administered 2021-03-25: 0.25 mL via INTRAMUSCULAR
  Filled 2021-03-25: qty 0.25

## 2021-03-25 NOTE — Progress Notes (Signed)
Occupational Therapy Treatment Patient Details Name: Terri Liu MRN: [id number] DOB: Jan 10, 1934 Today's Date: 03/25/2021    History of present illness Pt adm 7/5 after fall causing rt humeral head/neck  fx and sacral ala and superior and inferior pubic rami fx on the rt. PMH - HTN, post herpetic neuralgia, arthritis, lt femur fx, rt TKR, lt TKR, covid   OT comments  Pt making progress with functional goals. Session focused on bed mobility to sit EOB, sit - stand HHA , SPT to [facility], toileting tasks, SPT to recliner, UB dressing, grooming and self feeding set up of tray. Adjusted sling to proper fit once pt seated EOB. OT will continue to follow acutely to maximize level of function and safety  Follow Up Recommendations  SNF;Supervision/Assistance - 24 hour    Equipment Recommendations  Other (comment) (TBD at SNF)    Recommendations for Other Services      Precautions / Restrictions Precautions Precautions: Fall Required Braces or Orthoses: Sling Restrictions Weight Bearing Restrictions: Yes RUE Weight Bearing: Non weight bearing RLE Weight Bearing: Weight bearing as tolerated       Mobility Bed Mobility Overal bed mobility: Needs Assistance Bed Mobility: Supine to Sit     Supine to sit: Mod assist     General bed mobility comments: Extra time for pt to manage legs off EOB and use L bed rail to pull with L UE to ascend trunk while HOB elevated.    Transfers Overall transfer level: Needs assistance Equipment used: 1 person hand held assist Transfers: Sit to/from [facility] Sit to Stand: Mod assist Stand pivot transfers: Mod assist       General transfer comment: Sit to stand from EOB with L HHA, modA to power up to stand. >    Balance Overall balance assessment: Needs assistance Sitting-balance support: No upper extremity supported;Feet supported Sitting balance-Leahy Scale: Good     Standing balance support: Single extremity supported;During  functional activity Standing balance-Leahy Scale: Poor                             ADL either performed or assessed with clinical judgement   ADL Overall ADL's : Needs assistance/impaired Eating/Feeding: Independent;Sitting   Grooming: Wash/dry hands;Wash/dry face;Min guard;Sitting           Upper Body Dressing : Minimal assistance;Sitting       Toilet Transfer: Cueing for safety;Cueing for sequencing;Stand-pivot;BSC;Moderate assistance   Toileting- Clothing Manipulation and Hygiene: Maximal assistance       Functional mobility during ADLs: Moderate assistance;Cueing for sequencing;Cueing for safety       Vision Baseline Vision/History: Wears glasses Wears Glasses: At all times Patient Visual Report: No change from baseline     Perception     Praxis      Cognition Arousal/Alertness: Awake/alert Behavior During Therapy: WFL for tasks assessed/performed Overall Cognitive Status: Within Functional Limits for tasks assessed                                 General Comments: Upon entering room, pt tearful on the phone with her daughter        Exercises     Shoulder Instructions       General Comments      Pertinent Vitals/ Pain       Pain Assessment: Faces Faces Pain Scale: Hurts little more Pain Location: RUE and  RLE Pain Descriptors / Indicators: Aching;Grimacing;Guarding;Sore Pain Intervention(s): Limited activity within patient's tolerance;Monitored during session;Repositioned;Ice applied  Home Living                                          Prior Functioning/Environment              Frequency  Min 2X/week        Progress Toward Goals  OT Goals(current goals can now be found in the care plan section)  Progress towards OT goals: Progressing toward goals  Acute Rehab OT Goals Patient Stated Goal: go to rehab and go home  Plan Discharge plan remains appropriate    Co-evaluation                  AM-PAC OT "6 Clicks" Daily Activity     Outcome Measure   Help from another person eating meals?: None Help from another person taking care of personal grooming?: None Help from another person toileting, which includes using toliet, bedpan, or urinal?: A Lot Help from another person bathing (including washing, rinsing, drying)?: A Lot Help from another person to put on and taking off regular upper body clothing?: A Little Help from another person to put on and taking off regular lower body clothing?: Total 6 Click Score: 16    End of Session Equipment Utilized During Treatment: Gait belt;Other (comment) (BSC)  OT Visit Diagnosis: Unsteadiness on feet (R26.81);Other abnormalities of gait and mobility (R26.89);Muscle weakness (generalized) (M62.81)   Activity Tolerance Patient tolerated treatment well   Patient Left with call bell/phone within reach;in chair;with chair alarm set   Nurse Communication          Time: [phone] OT Time Calculation (min): 26 min  Charges: OT General Charges $OT Visit: 1 Visit OT Treatments $Self Care/Home Management : 8-22 mins $Therapeutic Activity: 8-22 mins     Britt Bottom 03/25/2021, 1:47 PM

## 2021-03-25 NOTE — Discharge Summary (Signed)
Physician Discharge Summary  Terri Liu WIO:[id number] DOB: 12-May-1934 DOA: 03/16/2021  PCP: Leeroy Cha, MD  Admit date: 03/16/2021 Discharge date: 03/25/2021  Admitted From: Home Disposition:  SNF  Recommendations for Outpatient Follow-up:  Follow up with PCP in 1-2 weeks Please obtain BMP/CBC in one week   Home Health:no Equipment/Devices:none  Discharge Condition:Stable CODE STATUS:DNR Diet recommendation: Heart Healthy   Brief/Interim Summary: 87 y.o. female HTN, postherpetic neuralgia, HLD, left hip fracture 01/2017 COVID-pneumonia 09/14/2020 status post remdesivir, steroids HFpEF/HFrEF-Echo 40-45% 12/31/2019 + chronic LBBB. Orthopedics and trauma were consulted-patient deemed her nonoperative candidate  Discharge Diagnoses:  Principal Problem:   Closed fracture of proximal end of right humerus, unspecified fracture morphology, initial encounter Active Problems:   HYPERTENSION, BENIGN SYSTEMIC   Multiple pelvic fractures (HCC)   Chronic systolic CHF (congestive heart failure) (HCC)   Depression  Accidental fall leading to closed fracture of the proximal end of the right humerus: Orthopedic surgery was consulted recommended conservative management she was started on narcotics which controlled her pain. She will resume aspirin as an outpatient.  Abdominal discomfort/nausea: Resolved she was started on Protonix.  Iliopsoas hematoma with bilateral displacement: Anticoagulation were held as well as antiplatelet therapy. She will restart these as an outpatient.  Chronic systolic and diastolic heart failure: Appears to be compensated no changes made to her medication.  Mild leukocytosis: Likely stress demargination due to fracture she remained afebrile this resolved.  Normocytic anemia: Follow-up with PCP as an outpatient plan.  Depression: Continue Trileptal.   Discharge Instructions  Discharge Instructions     Diet - low sodium heart healthy    Complete by: As directed    Diet - low sodium heart healthy   Complete by: As directed    Increase activity slowly   Complete by: As directed    Increase activity slowly   Complete by: As directed       Allergies as of 03/25/2021       Reactions   Cymbalta [duloxetine Hcl] Other (See Comments)   Made her very sick (flu-like symptoms)   Lipitor [atorvastatin] Other (See Comments)   Muscle pain in legs   Other Other (See Comments)   Other reaction(s): OPIOID - analgesic unknown reaction   Amlodipine Besylate-valsartan Rash   Codeine Rash   Hydrocodone-acetaminophen Rash   Tape Other (See Comments)   Pulled skin off. Paper tape is ok.         Medication List     STOP taking these medications    ascorbic acid 500 MG tablet Commonly known as: VITAMIN C   BIOTIN FORTE PO   cholecalciferol 1000 units tablet Commonly known as: VITAMIN D   TART CHERRY ADVANCED PO   traMADol 50 MG tablet Commonly known as: ULTRAM       TAKE these medications    acetaminophen 500 MG tablet Commonly known as: TYLENOL Take 1,000 mg by mouth every 8 (eight) hours as needed for mild pain.   ARTIFICIAL TEARS OP Place 1 drop into both eyes daily as needed (dry eyes).   aspirin 81 MG EC tablet Take 1 tablet (81 mg total) by mouth daily. What changed: when to take this   escitalopram 10 MG tablet Commonly known as: LEXAPRO Take 1 tablet (10 mg total) by mouth daily.   fluticasone 50 MCG/ACT nasal spray Commonly known as: FLONASE Place 1 spray into both nostrils daily.   losartan 25 MG tablet Commonly known as: COZAAR Take 1 tablet (25 mg total) by  mouth daily. What changed:  medication strength how much to take   lovastatin 40 MG tablet Commonly known as: MEVACOR Take 40 mg by mouth daily.   methocarbamol 500 MG tablet Commonly known as: ROBAXIN Take 2 tablets (1,000 mg total) by mouth every 8 (eight) hours.   metoprolol tartrate 25 MG tablet Commonly known as:  LOPRESSOR Take 0.5 tablets (12.5 mg total) by mouth 2 (two) times daily. What changed: how much to take   OXcarbazepine 150 MG tablet Commonly known as: TRILEPTAL Take 1 tablet (150 mg total) by mouth 2 (two) times daily.   oxyCODONE 5 MG/5ML solution Commonly known as: ROXICODONE Take 5-10 mLs (5-10 mg total) by mouth every 3 (three) hours as needed for moderate pain or severe pain (5mg  for moderate pain, 10mg  for severe pain).   pantoprazole 40 MG tablet Commonly known as: PROTONIX Take 1 tablet (40 mg total) by mouth daily.   polyethylene glycol 17 g packet Commonly known as: MIRALAX / GLYCOLAX Take 17 g by mouth daily.   prenatal multivitamin Tabs tablet Take 1 tablet by mouth every morning.   senna-docusate 8.6-50 MG tablet Commonly known as: Senokot-S Take 1 tablet by mouth at bedtime.   vitamin B-12 100 MCG tablet Commonly known as: CYANOCOBALAMIN Take 100 mcg by mouth daily.        Allergies  Allergen Reactions   Cymbalta [Duloxetine Hcl] Other (See Comments)    Made her very sick (flu-like symptoms)   Lipitor [Atorvastatin] Other (See Comments)    Muscle pain in legs   Other Other (See Comments)    Other reaction(s): OPIOID - analgesic unknown reaction   Amlodipine Besylate-Valsartan Rash   Codeine Rash   Hydrocodone-Acetaminophen Rash   Tape Other (See Comments)    Pulled skin off. Paper tape is ok.     Consultations: Orthopedic surgery   Procedures/Studies: CT ABDOMEN PELVIS WO CONTRAST  Result Date: 03/18/2021 CLINICAL DATA:  Suspect retroperitoneal hematoma. EXAM: CT ABDOMEN AND PELVIS WITHOUT CONTRAST TECHNIQUE: Multidetector CT imaging of the abdomen and pelvis was performed following the standard protocol without IV contrast. COMPARISON:  03/17/2021 FINDINGS: Lower chest: Atelectasis noted dependent right lung base. Hepatobiliary: No focal abnormality in the liver on this study without intravenous contrast. Gallbladder is surgically absent. No  intrahepatic or extrahepatic biliary dilation. Pancreas: No focal mass lesion. No dilatation of the main duct. No intraparenchymal cyst. No peripancreatic edema. Spleen: No splenomegaly. No focal mass lesion. Adrenals/Urinary Tract: No adrenal nodule or mass. Several cysts again noted right kidney measuring up to approximately 4.9 cm. Left kidney unremarkable on noncontrast imaging. No evidence for hydroureter. The urinary bladder appears normal for the degree of distention. Stomach/Bowel: Stomach is unremarkable. No gastric wall thickening. No evidence of outlet obstruction. Duodenum is normally positioned as is the ligament of Treitz. No small bowel wall thickening. No small bowel dilatation. The terminal ileum is normal. The appendix is normal. No gross colonic mass. No colonic wall thickening. Vascular/Lymphatic: There is moderate calcified aortic atherosclerosis without aneurysm. There is no gastrohepatic or hepatoduodenal ligament lymphadenopathy. No retroperitoneal or mesenteric lymphadenopathy. No pelvic sidewall lymphadenopathy. Reproductive: Uterus surgically absent.  There is no adnexal mass. Other: No substantial intraperitoneal free fluid. Musculoskeletal: Fixation hardware noted left proximal femur. Right sacral fracture extending through the SI joint into the posterior iliac bone is associated with fractures of the right superior and inferior pubic rami. Diffuse degenerative disc disease noted in the lower thoracic and lumbar spine. Slight interval increase in edema/hemorrhage  in the presacral space. The hemorrhage seen previously in the right pelvic sidewall and space of Retzius anterior to the bladder has decreased in the interval. IMPRESSION: 1. Right sacral fracture extending through the SI joint into the posterior iliac bone is associated with fractures of the right superior and inferior pubic rami. No substantial change 2. No new evidence for retroperitoneal hematoma. There is some increase in  edema/hemorrhage in the presacral space, potentially representing dependent migration of blood products from before as the right pelvic sidewall and perivesical edema/hemorrhage has decreased since the prior. 3. Right renal cysts. 4. Aortic Atherosclerosis (ICD10-I70.0). Electronically Signed   By: Misty Stanley M.D.   On: 03/18/2021 18:44   DG Shoulder Right  Result Date: 03/16/2021 CLINICAL DATA:  Pain and limited range of motion after a fall. EXAM: RIGHT SHOULDER - 2+ VIEW COMPARISON:  03/05/2018 FINDINGS: Acute comminuted fractures involving the right humeral head and neck. Fracture lines extend across the greater tuberosity with mild displacement of the tuberosity fragment. Fracture fragments are impacted. No glenohumeral dislocation. Degenerative changes in the glenohumeral and acromioclavicular joints. Increase of the subacromial space likely represents an effusion. IMPRESSION: Acute comminuted fractures of the right humeral head and neck. Electronically Signed   By: Lucienne Capers M.D.   On: 03/16/2021 22:59   CT HEAD WO CONTRAST  Result Date: 03/17/2021 CLINICAL DATA:  Mechanical fall.  Head trauma. EXAM: CT HEAD WITHOUT CONTRAST CT CERVICAL SPINE WITHOUT CONTRAST TECHNIQUE: Multidetector CT imaging of the head and cervical spine was performed following the standard protocol without intravenous contrast. Multiplanar CT image reconstructions of the cervical spine were also generated. COMPARISON:  MRI brain 06/23/2017 FINDINGS: CT HEAD FINDINGS Brain: No evidence of acute infarction, hemorrhage, hydrocephalus, extra-axial collection or mass lesion/mass effect. Mild cerebral atrophy. Patchy low-attenuation changes in the deep white matter consistent with small vessel ischemia. 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 Vascular: Moderate intracranial arterial vascular calcifications. Skull: Calvarium appears intact. Sinuses/Orbits: Paranasal sinuses and mastoid air cells are clear. Other: None. CT  CERVICAL SPINE FINDINGS Alignment: Straightening of usual cervical lordosis is likely positional or degenerative. No anterior subluxations. Normal alignment of the facet joints. C1-2 articulation appears intact. Head tilt stores the right, likely positional but could indicate muscle spasm. Skull base and vertebrae: No acute fracture. No primary bone lesion or focal pathologic process. Soft tissues and spinal canal: No prevertebral fluid or swelling. No visible canal hematoma. Disc levels: Degenerative changes throughout with narrowed disc spaces and endplate osteophyte formation. Degenerative changes in the facet joints. Upper chest: Lung apices are clear. Other: None. IMPRESSION: 1. No acute intracranial abnormalities. Mild cerebral atrophy and small vessel ischemic changes. 2. Nonspecific straightening of usual cervical lordosis. Diffuse degenerative change. No acute displaced fractures identified. Electronically Signed   By: Lucienne Capers M.D.   On: 03/17/2021 00:59   CT CERVICAL SPINE WO CONTRAST  Result Date: 03/17/2021 CLINICAL DATA:  Mechanical fall.  Head trauma. EXAM: CT HEAD WITHOUT CONTRAST CT CERVICAL SPINE WITHOUT CONTRAST TECHNIQUE: Multidetector CT imaging of the head and cervical spine was performed following the standard protocol without intravenous contrast. Multiplanar CT image reconstructions of the cervical spine were also generated. COMPARISON:  MRI brain 06/23/2017 FINDINGS: CT HEAD FINDINGS Brain: No evidence of acute infarction, hemorrhage, hydrocephalus, extra-axial collection or mass lesion/mass effect. Mild cerebral atrophy. Patchy low-attenuation changes in the deep white matter consistent with small vessel ischemia. 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 Vascular: Moderate intracranial arterial vascular calcifications.  Skull: Calvarium appears intact. Sinuses/Orbits: Paranasal sinuses and mastoid air cells are clear. Other: None. CT CERVICAL SPINE FINDINGS Alignment:  Straightening of usual cervical lordosis is likely positional or degenerative. No anterior subluxations. Normal alignment of the facet joints. C1-2 articulation appears intact. Head tilt stores the right, likely positional but could indicate muscle spasm. Skull base and vertebrae: No acute fracture. No primary bone lesion or focal pathologic process. Soft tissues and spinal canal: No prevertebral fluid or swelling. No visible canal hematoma. Disc levels: Degenerative changes throughout with narrowed disc spaces and endplate osteophyte formation. Degenerative changes in the facet joints. Upper chest: Lung apices are clear. Other: None. IMPRESSION: 1. No acute intracranial abnormalities. Mild cerebral atrophy and small vessel ischemic changes. 2. Nonspecific straightening of usual cervical lordosis. Diffuse degenerative change. No acute displaced fractures identified. Electronically Signed   By: Lucienne Capers M.D.   On: 03/17/2021 00:59   CT CHEST ABDOMEN PELVIS W CONTRAST  Result Date: 03/17/2021 CLINICAL DATA:  Mechanical fall.  Right hip pain. EXAM: CT CHEST, ABDOMEN, AND PELVIS WITH CONTRAST TECHNIQUE: Multidetector CT imaging of the chest, abdomen and pelvis was performed following the standard protocol during bolus administration of intravenous contrast. CONTRAST:  186mL OMNIPAQUE IOHEXOL 300 MG/ML  SOLN COMPARISON:  Chest radiograph 03/16/2021 FINDINGS: CT CHEST FINDINGS Cardiovascular: Normal heart size. No pericardial effusions. Coronary artery, aortic, and aortic valve calcifications. Normal caliber thoracic aorta. No dissection. Great vessel origins are patent. Mediastinum/Nodes: Esophagus is decompressed. No significant lymphadenopathy. Thyroid gland is unremarkable. No abnormal mediastinal gas or fluid collections. Lungs/Pleura: Atelectasis in the right lung base. Lungs are otherwise clear and expanded. No pleural effusions. No pneumothorax. Airways are patent. Musculoskeletal: Comminuted and  displaced fractures of the right humeral head and neck. No glenohumeral dislocation. Right shoulder effusion. Intramuscular lipoma in the posterior shoulder musculature. Large cystic collections anterior to the left shoulder and in the subcoracoid region likely bursal collections. Prominent degenerative changes in the left shoulder. Normal alignment of the thoracic spine. Diffuse degenerative changes. No acute compression deformities identified. Visualized ribs appear intact. No sternal depression. CT ABDOMEN PELVIS FINDINGS Hepatobiliary: No focal liver abnormality is seen. Status post cholecystectomy. No biliary dilatation. Pancreas: Unremarkable. No pancreatic ductal dilatation or surrounding inflammatory changes. Spleen: Normal in size without focal abnormality. Adrenals/Urinary Tract: No adrenal gland nodules. Bilateral renal cysts. Nephrograms are homogeneous and normal. No hydronephrosis or hydroureter. Bladder is displaced towards the left by right obturator/ileus psoas hematoma. Stomach/Bowel: Stomach is within normal limits. Appendix appears normal. No evidence of bowel wall thickening, distention, or inflammatory changes. Vascular/Lymphatic: Aortic atherosclerosis. No enlarged abdominal or pelvic lymph nodes. Reproductive: Status post hysterectomy. No adnexal masses. Other: No free air or free fluid in the abdomen. Musculoskeletal: Degenerative changes in the lumbar spine. Normal alignment. No vertebral compression deformities. Acute linear nondisplaced fractures are demonstrated in the right sacral ala extending through the SI joint into the posterosuperior right iliac bone. The SI joint is not significantly widened. Mildly displaced fractures of the right superior and inferior pubic rami. Associated hematomas in the right obturator and ileus psoas regions with displacement of the bladder towards the right. Degenerative changes in the hips. Previous internal fixation of the left hip. No hip fractures are  demonstrated. Lumbar scoliosis convex towards the left. IMPRESSION: 1. No evidence of mediastinal or pulmonary parenchymal injury. No evidence of solid abdominal organ injury or bowel perforation. 2. Comminuted fractures of the proximal right humerus. 3. Nondisplaced fractures of the right sacral ala extending through  the SI joint into the posterosuperior right iliac bone. No widening of the sacral iliac joint. 4. Mildly displaced fractures of the right superior and inferior pubic rami with associated hematomas in the iliopsoas and obturator regions. Hematoma causes displacement bladder towards the left. 5. Degenerative changes in the left shoulder with large bursal collections. 6. Right lung base atelectasis. 7. Aortic atherosclerosis. Electronically Signed   By: Lucienne Capers M.D.   On: 03/17/2021 01:15   DG Chest Portable 1 View  Result Date: 03/16/2021 CLINICAL DATA:  Trauma.  Mechanical fall. EXAM: PORTABLE CHEST 1 VIEW COMPARISON:  Chest x-ray 09/13/2020. FINDINGS: The heart size and mediastinal contours are unchanged. Aortic calcification. Similar-appearing elevated right hemidiaphragm. Right base atelectasis. No focal consolidation. No pulmonary edema. No pleural effusion. No pneumothorax. Comminuted right humeral fracture new from 09/13/2020. IMPRESSION: 1. Comminuted right humeral fracture new from 09/13/2020. please see separately dictated dedicated right shoulder radiographs 03/16/2021. 2. No acute cardiopulmonary abnormality. Electronically Signed   By: Iven Finn M.D.   On: 03/16/2021 23:30   DG Humerus Right  Result Date: 03/16/2021 CLINICAL DATA:  Pain and limited range of motion after a fall. EXAM: RIGHT HUMERUS - 2+ VIEW COMPARISON:  03/05/2018 FINDINGS: Acute comminuted fractures of the right humeral head and neck with superior displacement and impaction of the distal fracture fragments resulting in varus angulation. The midshaft and distal right humerus are intact. Diffuse soft  tissue swelling. IMPRESSION: Acute comminuted fractures of the right humeral head and neck with varus angulation. Electronically Signed   By: Lucienne Capers M.D.   On: 03/16/2021 22:58   DG Hip Unilat With Pelvis 2-3 Views Right  Result Date: 03/16/2021 CLINICAL DATA:  Pain and limited range of motion after a fall EXAM: DG HIP (WITH OR WITHOUT PELVIS) 2-3V RIGHT COMPARISON:  None. FINDINGS: Degenerative changes in the lower lumbar spine and hips. Postoperative internal fixation of the left proximal femur. Acute appearing displaced fractures of the right superior and inferior pubic rami. No right hip fracture identified. SI joints and symphysis pubis are not displaced. IMPRESSION: Acute displaced fractures of the right superior and inferior pubic rami. Degenerative changes in the lower lumbar spine and hips. Electronically Signed   By: Lucienne Capers M.D.   On: 03/16/2021 22:57   (Echo, Carotid, EGD, Colonoscopy, ERCP)    Subjective: Relates her pain is controlled.  Discharge Exam: Vitals:   03/25/21 0005 03/25/21 0458  BP: (!) 136/56 (!) 158/70  Pulse: 74 75  Resp: 17 17  Temp: 97.6 F (36.4 C) (!) 97.5 F (36.4 C)  SpO2: 98% 95%   Vitals:   03/24/21 1724 03/24/21 2057 03/25/21 0005 03/25/21 0458  BP: (!) 122/47 (!) 146/53 (!) 136/56 (!) 158/70  Pulse: 81 81 74 75  Resp: 16 18 17 17   Temp: 99.3 F (37.4 C) 98.8 F (37.1 C) 97.6 F (36.4 C) (!) 97.5 F (36.4 C)  TempSrc: Oral Oral Oral Oral  SpO2: 96% 94% 98% 95%  Weight:    81.3 kg  Height:        General: Pt is alert, awake, not in acute distress Cardiovascular: RRR, S1/S2 +, no rubs, no gallops Respiratory: CTA bilaterally, no wheezing, no rhonchi Abdominal: Soft, NT, ND, bowel sounds + Extremities: no edema, no cyanosis    The results of significant diagnostics from this hospitalization (including imaging, microbiology, ancillary and laboratory) are listed below for reference.     Microbiology: Recent Results  (from the past 240 hour(s))  Resp  Panel by RT-PCR (Flu A&B, Covid) Nasopharyngeal Swab     Status: None   Collection Time: 03/16/21 11:55 PM   Specimen: Nasopharyngeal Swab; Nasopharyngeal(NP) swabs in vial transport medium  Result Value Ref Range Status   SARS Coronavirus 2 by RT PCR NEGATIVE NEGATIVE Final    Comment: (NOTE) SARS-CoV-2 target nucleic acids are NOT DETECTED.  The SARS-CoV-2 RNA is generally detectable in upper respiratory specimens during the acute phase of infection. The lowest concentration of SARS-CoV-2 viral copies this assay can detect is 138 copies/mL. A negative result does not preclude SARS-Cov-2 infection and should not be used as the sole basis for treatment or other patient management decisions. A negative result may occur with  improper specimen collection/handling, submission of specimen other than nasopharyngeal swab, presence of viral mutation(s) within the areas targeted by this assay, and inadequate number of viral copies(<138 copies/mL). A negative result must be combined with clinical observations, patient history, and epidemiological information. The expected result is Negative.  Fact Sheet for Patients:  EntrepreneurPulse.com.au  Fact Sheet for Healthcare Providers:  IncredibleEmployment.be  This test is no t yet approved or cleared by the [place] FDA and  has been authorized for detection and/or diagnosis of SARS-CoV-2 by FDA under an Emergency Use Authorization (EUA). This EUA will remain  in effect (meaning this test can be used) for the duration of the COVID-19 declaration under Section 564(b)(1) of the Act, 21 U.S.C.section 360bbb-3(b)(1), unless the authorization is terminated  or revoked sooner.       Influenza A by PCR NEGATIVE NEGATIVE Final   Influenza B by PCR NEGATIVE NEGATIVE Final    Comment: (NOTE) The Xpert Xpress SARS-CoV-2/FLU/RSV plus assay is intended as an aid in the  diagnosis of influenza from Nasopharyngeal swab specimens and should not be used as a sole basis for treatment. Nasal washings and aspirates are unacceptable for Xpert Xpress SARS-CoV-2/FLU/RSV testing.  Fact Sheet for Patients: EntrepreneurPulse.com.au  Fact Sheet for Healthcare Providers: IncredibleEmployment.be  This test is not yet approved or cleared by the [place] FDA and has been authorized for detection and/or diagnosis of SARS-CoV-2 by FDA under an Emergency Use Authorization (EUA). This EUA will remain in effect (meaning this test can be used) for the duration of the COVID-19 declaration under Section 564(b)(1) of the Act, 21 U.S.C. section 360bbb-3(b)(1), unless the authorization is terminated or revoked.  Performed at [facility], [place] [address], [place], [place] [address]   SARS CORONAVIRUS 2 (TAT 6-24 HRS) Nasopharyngeal Nasopharyngeal Swab     Status: None   Collection Time: 03/22/21  5:24 PM   Specimen: Nasopharyngeal Swab  Result Value Ref Range Status   SARS Coronavirus 2 NEGATIVE NEGATIVE Final    Comment: (NOTE) SARS-CoV-2 target nucleic acids are NOT DETECTED.  The SARS-CoV-2 RNA is generally detectable in upper and lower respiratory specimens during the acute phase of infection. Negative results do not preclude SARS-CoV-2 infection, do not rule out co-infections with other pathogens, and should not be used as the sole basis for treatment or other patient management decisions. Negative results must be combined with clinical observations, patient history, and epidemiological information. The expected result is Negative.  Fact Sheet for Patients: SugarRoll.be  Fact Sheet for Healthcare Providers: https://www.woods-mathews.com/  This test is not yet approved or cleared by the [place] FDA and  has been authorized for detection and/or diagnosis of SARS-CoV-2  by FDA under an Emergency Use Authorization (EUA). This EUA will remain  in effect (  meaning this test can be used) for the duration of the COVID-19 declaration under Se ction 564(b)(1) of the Act, 21 U.S.C. section 360bbb-3(b)(1), unless the authorization is terminated or revoked sooner.  Performed at [facility], [place] [address] [place], [place] [address]      Labs: BNP (last 3 results) No results for input(s): BNP in the last 8760 hours. Basic Metabolic Panel: Recent Labs  Lab 03/19/21 0051 03/20/21 0056 03/22/21 0106  NA 130* 130* 131*  K 4.7 4.6 4.1  CL 99 96* 95*  CO2 26 30 31   GLUCOSE 109* 111* 101*  BUN 15 10 8   CREATININE 0.68 0.57 0.49  CALCIUM 8.5* 8.5* 8.4*   Liver Function Tests: Recent Labs  Lab 03/20/21 0056 03/22/21 0106  AST 17 22  ALT 15 16  ALKPHOS 34* 42  BILITOT 1.2 1.3*  PROT 4.6* 4.7*  ALBUMIN 2.4* 2.4*   No results for input(s): LIPASE, AMYLASE in the last 168 hours. No results for input(s): AMMONIA in the last 168 hours. CBC: Recent Labs  Lab 03/18/21 1411 03/18/21 2108 03/19/21 0051 03/20/21 0056 03/22/21 0106  WBC 8.2 8.0 8.8 7.1 8.1  NEUTROABS  --   --   --   --  4.9  HGB 8.5* 8.9* 8.7* 8.8* 8.9*  HCT 26.0* 26.5* 26.4* 25.7* 26.2*  MCV 97.7 97.4 98.9 95.9 94.9  PLT 168 148* 156 152 225   Cardiac Enzymes: No results for input(s): CKTOTAL, CKMB, CKMBINDEX, TROPONINI in the last 168 hours. BNP: Invalid input(s): POCBNP CBG: No results for input(s): GLUCAP in the last 168 hours. D-Dimer No results for input(s): DDIMER in the last 72 hours. Hgb A1c No results for input(s): HGBA1C in the last 72 hours. Lipid Profile No results for input(s): CHOL, HDL, LDLCALC, TRIG, CHOLHDL, LDLDIRECT in the last 72 hours. Thyroid function studies No results for input(s): TSH, T4TOTAL, T3FREE, THYROIDAB in the last 72 hours.  Invalid input(s): FREET3 Anemia work up No results for input(s): VITAMINB12, FOLATE, FERRITIN, TIBC, IRON,  RETICCTPCT in the last 72 hours. Urinalysis    Component Value Date/Time   COLORURINE YELLOW 03/17/2021 2005   APPEARANCEUR CLEAR 03/17/2021 2005   LABSPEC >1.046 (H) 03/17/2021 2005   PHURINE 5.0 03/17/2021 2005   GLUCOSEU NEGATIVE 03/17/2021 2005   HGBUR NEGATIVE 03/17/2021 2005   [place] NEGATIVE 03/17/2021 2005   KETONESUR NEGATIVE 03/17/2021 2005   PROTEINUR NEGATIVE 03/17/2021 2005   UROBILINOGEN 0.2 05/26/2008 1130   NITRITE NEGATIVE 03/17/2021 2005   LEUKOCYTESUR LARGE (A) 03/17/2021 2005   Sepsis Labs Invalid input(s): PROCALCITONIN,  WBC,  LACTICIDVEN Microbiology Recent Results (from the past 240 hour(s))  Resp Panel by RT-PCR (Flu A&B, Covid) Nasopharyngeal Swab     Status: None   Collection Time: 03/16/21 11:55 PM   Specimen: Nasopharyngeal Swab; Nasopharyngeal(NP) swabs in vial transport medium  Result Value Ref Range Status   SARS Coronavirus 2 by RT PCR NEGATIVE NEGATIVE Final    Comment: (NOTE) SARS-CoV-2 target nucleic acids are NOT DETECTED.  The SARS-CoV-2 RNA is generally detectable in upper respiratory specimens during the acute phase of infection. The lowest concentration of SARS-CoV-2 viral copies this assay can detect is 138 copies/mL. A negative result does not preclude SARS-Cov-2 infection and should not be used as the sole basis for treatment or other patient management decisions. A negative result may occur with  improper specimen collection/handling, submission of specimen other than nasopharyngeal swab, presence of viral mutation(s) within the areas targeted by this  assay, and inadequate number of viral copies(<138 copies/mL). A negative result must be combined with clinical observations, patient history, and epidemiological information. The expected result is Negative.  Fact Sheet for Patients:  EntrepreneurPulse.com.au  Fact Sheet for Healthcare Providers:  IncredibleEmployment.be  This test is no  t yet approved or cleared by the [place] FDA and  has been authorized for detection and/or diagnosis of SARS-CoV-2 by FDA under an Emergency Use Authorization (EUA). This EUA will remain  in effect (meaning this test can be used) for the duration of the COVID-19 declaration under Section 564(b)(1) of the Act, 21 U.S.C.section 360bbb-3(b)(1), unless the authorization is terminated  or revoked sooner.       Influenza A by PCR NEGATIVE NEGATIVE Final   Influenza B by PCR NEGATIVE NEGATIVE Final    Comment: (NOTE) The Xpert Xpress SARS-CoV-2/FLU/RSV plus assay is intended as an aid in the diagnosis of influenza from Nasopharyngeal swab specimens and should not be used as a sole basis for treatment. Nasal washings and aspirates are unacceptable for Xpert Xpress SARS-CoV-2/FLU/RSV testing.  Fact Sheet for Patients: EntrepreneurPulse.com.au  Fact Sheet for Healthcare Providers: IncredibleEmployment.be  This test is not yet approved or cleared by the [place] FDA and has been authorized for detection and/or diagnosis of SARS-CoV-2 by FDA under an Emergency Use Authorization (EUA). This EUA will remain in effect (meaning this test can be used) for the duration of the COVID-19 declaration under Section 564(b)(1) of the Act, 21 U.S.C. section 360bbb-3(b)(1), unless the authorization is terminated or revoked.  Performed at [facility], [place] [address] [place], [place] [address]   SARS CORONAVIRUS 2 (TAT 6-24 HRS) Nasopharyngeal Nasopharyngeal Swab     Status: None   Collection Time: 03/22/21  5:24 PM   Specimen: Nasopharyngeal Swab  Result Value Ref Range Status   SARS Coronavirus 2 NEGATIVE NEGATIVE Final    Comment: (NOTE) SARS-CoV-2 target nucleic acids are NOT DETECTED.  The SARS-CoV-2 RNA is generally detectable in upper and lower respiratory specimens during the acute phase of infection. Negative results do not preclude  SARS-CoV-2 infection, do not rule out co-infections with other pathogens, and should not be used as the sole basis for treatment or other patient management decisions. Negative results must be combined with clinical observations, patient history, and epidemiological information. The expected result is Negative.  Fact Sheet for Patients: SugarRoll.be  Fact Sheet for Healthcare Providers: https://www.woods-mathews.com/  This test is not yet approved or cleared by the [place] FDA and  has been authorized for detection and/or diagnosis of SARS-CoV-2 by FDA under an Emergency Use Authorization (EUA). This EUA will remain  in effect (meaning this test can be used) for the duration of the COVID-19 declaration under Se ction 564(b)(1) of the Act, 21 U.S.C. section 360bbb-3(b)(1), unless the authorization is terminated or revoked sooner.  Performed at [facility], [place] [address], [place], [place] [address]      Time coordinating discharge: Over 30 minutes  SIGNED:   Charlynne Cousins, MD  [facility] 03/25/2021, 7:37 AM Pager   If 7PM-7AM, please contact night-coverage [email or web address] Password [email or web address]

## 2021-03-25 NOTE — TOC Progression Note (Signed)
Transition of Care [facility]) - Progression Note    Patient Details  Name: Terri Liu MRN: [id number] Date of Birth: Nov 09, 1933  [facility]) CM/SW Contact  Emeterio Reeve, [place] Phone Number: 03/25/2021, 4:38 PM  Clinical Narrative:     [facility] was unable to accept pt due to "her high needs". Pt picked [facility]. [facility] is able to accept pt if she gets covid booster. Pt is agreeable to covid booster. CSW informed MD and Nurse. CSW updated insurance auth with [employer].   Expected Discharge Plan: Skilled Nursing Facility Barriers to Discharge: Ship broker, SNF Pending bed offer, Continued Medical Work up  Expected Discharge Plan and Services Expected Discharge Plan: [place] Choice: [place] arrangements for the past 2 months: Single Family Home Expected Discharge Date: 03/25/21                                     Social Determinants of Health (SDOH) Interventions    Readmission Risk Interventions No flowsheet data found.  Emeterio Reeve, Latanya Presser, [place] Social Worker [phone]

## 2021-03-26 DIAGNOSIS — F32A Depression, unspecified: Secondary | ICD-10-CM | POA: Diagnosis not present

## 2021-03-26 DIAGNOSIS — R2681 Unsteadiness on feet: Secondary | ICD-10-CM | POA: Diagnosis not present

## 2021-03-26 DIAGNOSIS — Z5181 Encounter for therapeutic drug level monitoring: Secondary | ICD-10-CM | POA: Diagnosis not present

## 2021-03-26 DIAGNOSIS — D649 Anemia, unspecified: Secondary | ICD-10-CM | POA: Diagnosis not present

## 2021-03-26 DIAGNOSIS — M25511 Pain in right shoulder: Secondary | ICD-10-CM | POA: Diagnosis not present

## 2021-03-26 DIAGNOSIS — Z886 Allergy status to analgesic agent status: Secondary | ICD-10-CM | POA: Diagnosis not present

## 2021-03-26 DIAGNOSIS — S7010XA Contusion of unspecified thigh, initial encounter: Secondary | ICD-10-CM | POA: Diagnosis not present

## 2021-03-26 DIAGNOSIS — S42309D Unspecified fracture of shaft of humerus, unspecified arm, subsequent encounter for fracture with routine healing: Secondary | ICD-10-CM | POA: Diagnosis not present

## 2021-03-26 DIAGNOSIS — S7010XD Contusion of unspecified thigh, subsequent encounter: Secondary | ICD-10-CM | POA: Diagnosis not present

## 2021-03-26 DIAGNOSIS — I5022 Chronic systolic (congestive) heart failure: Secondary | ICD-10-CM | POA: Diagnosis not present

## 2021-03-26 DIAGNOSIS — F3489 Other specified persistent mood disorders: Secondary | ICD-10-CM | POA: Diagnosis not present

## 2021-03-26 DIAGNOSIS — I10 Essential (primary) hypertension: Secondary | ICD-10-CM | POA: Diagnosis not present

## 2021-03-26 DIAGNOSIS — D509 Iron deficiency anemia, unspecified: Secondary | ICD-10-CM | POA: Diagnosis not present

## 2021-03-26 DIAGNOSIS — E782 Mixed hyperlipidemia: Secondary | ICD-10-CM | POA: Diagnosis not present

## 2021-03-26 DIAGNOSIS — R2689 Other abnormalities of gait and mobility: Secondary | ICD-10-CM | POA: Diagnosis not present

## 2021-03-26 DIAGNOSIS — F418 Other specified anxiety disorders: Secondary | ICD-10-CM | POA: Diagnosis not present

## 2021-03-26 DIAGNOSIS — M6281 Muscle weakness (generalized): Secondary | ICD-10-CM | POA: Diagnosis not present

## 2021-03-26 DIAGNOSIS — S42201A Unspecified fracture of upper end of right humerus, initial encounter for closed fracture: Secondary | ICD-10-CM | POA: Diagnosis not present

## 2021-03-26 DIAGNOSIS — S42309A Unspecified fracture of shaft of humerus, unspecified arm, initial encounter for closed fracture: Secondary | ICD-10-CM | POA: Diagnosis not present

## 2021-03-26 DIAGNOSIS — W19XXXA Unspecified fall, initial encounter: Secondary | ICD-10-CM | POA: Diagnosis not present

## 2021-03-26 DIAGNOSIS — S3210XD Unspecified fracture of sacrum, subsequent encounter for fracture with routine healing: Secondary | ICD-10-CM | POA: Diagnosis not present

## 2021-03-26 DIAGNOSIS — R262 Difficulty in walking, not elsewhere classified: Secondary | ICD-10-CM | POA: Diagnosis not present

## 2021-03-26 DIAGNOSIS — S42201D Unspecified fracture of upper end of right humerus, subsequent encounter for fracture with routine healing: Secondary | ICD-10-CM | POA: Diagnosis not present

## 2021-03-26 DIAGNOSIS — K219 Gastro-esophageal reflux disease without esophagitis: Secondary | ICD-10-CM | POA: Diagnosis not present

## 2021-03-26 DIAGNOSIS — M25551 Pain in right hip: Secondary | ICD-10-CM | POA: Diagnosis not present

## 2021-03-26 DIAGNOSIS — Z743 Need for continuous supervision: Secondary | ICD-10-CM | POA: Diagnosis not present

## 2021-03-26 DIAGNOSIS — S32501D Unspecified fracture of right pubis, subsequent encounter for fracture with routine healing: Secondary | ICD-10-CM | POA: Diagnosis not present

## 2021-03-26 DIAGNOSIS — F329 Major depressive disorder, single episode, unspecified: Secondary | ICD-10-CM | POA: Diagnosis not present

## 2021-03-26 DIAGNOSIS — I5041 Acute combined systolic (congestive) and diastolic (congestive) heart failure: Secondary | ICD-10-CM | POA: Diagnosis not present

## 2021-03-26 DIAGNOSIS — S3210XA Unspecified fracture of sacrum, initial encounter for closed fracture: Secondary | ICD-10-CM | POA: Diagnosis not present

## 2021-03-26 DIAGNOSIS — S329XXD Fracture of unspecified parts of lumbosacral spine and pelvis, subsequent encounter for fracture with routine healing: Secondary | ICD-10-CM | POA: Diagnosis not present

## 2021-03-26 DIAGNOSIS — S42291A Other displaced fracture of upper end of right humerus, initial encounter for closed fracture: Secondary | ICD-10-CM | POA: Diagnosis not present

## 2021-03-26 DIAGNOSIS — S3282XA Multiple fractures of pelvis without disruption of pelvic ring, initial encounter for closed fracture: Secondary | ICD-10-CM | POA: Diagnosis not present

## 2021-03-26 DIAGNOSIS — F4320 Adjustment disorder, unspecified: Secondary | ICD-10-CM | POA: Diagnosis not present

## 2021-03-26 DIAGNOSIS — S329XXA Fracture of unspecified parts of lumbosacral spine and pelvis, initial encounter for closed fracture: Secondary | ICD-10-CM | POA: Diagnosis not present

## 2021-03-26 DIAGNOSIS — Z9181 History of falling: Secondary | ICD-10-CM | POA: Diagnosis not present

## 2021-03-26 DIAGNOSIS — F331 Major depressive disorder, recurrent, moderate: Secondary | ICD-10-CM | POA: Diagnosis not present

## 2021-03-26 DIAGNOSIS — R5381 Other malaise: Secondary | ICD-10-CM | POA: Diagnosis not present

## 2021-03-26 DIAGNOSIS — F4321 Adjustment disorder with depressed mood: Secondary | ICD-10-CM | POA: Diagnosis not present

## 2021-03-26 DIAGNOSIS — E871 Hypo-osmolality and hyponatremia: Secondary | ICD-10-CM | POA: Diagnosis not present

## 2021-03-26 DIAGNOSIS — R52 Pain, unspecified: Secondary | ICD-10-CM | POA: Diagnosis not present

## 2021-03-26 DIAGNOSIS — S3289XA Fracture of other parts of pelvis, initial encounter for closed fracture: Secondary | ICD-10-CM | POA: Diagnosis not present

## 2021-03-26 DIAGNOSIS — D6489 Other specified anemias: Secondary | ICD-10-CM | POA: Diagnosis not present

## 2021-03-26 DIAGNOSIS — I5042 Chronic combined systolic (congestive) and diastolic (congestive) heart failure: Secondary | ICD-10-CM | POA: Diagnosis not present

## 2021-03-26 DIAGNOSIS — I5040 Unspecified combined systolic (congestive) and diastolic (congestive) heart failure: Secondary | ICD-10-CM | POA: Diagnosis not present

## 2021-03-26 NOTE — TOC Transition Note (Signed)
Transition of Care [facility]) - CM/SW Discharge Note   Patient Details  Name: Terri Liu MRN: [id number] Date of Birth: 1934/09/10  Transition of Care [facility]) CM/SW Contact:  Emeterio Reeve, LCSW Phone Number: 03/26/2021, 11:11 AM   Clinical Narrative:     Patient will DC to: [facility] Anticipated DC date: 03/26/21 Family notified: none Transport by: Corey Harold     Per MD patient ready for DC to [facility] RN, patient, patient's family, and facility notified of DC. Discharge Summary and FL2 sent to facility. DC packet on chart. Ambulance transport requested for patient.    RN to call report to [phone].  CSW will sign off for now as social work intervention is no longer needed. Please consult [place] again if new needs arise.   Final next level of care: Skilled Nursing Facility Barriers to Discharge: Barriers Resolved   Patient Goals and CMS Choice Patient states their goals for this hospitalization and ongoing recovery are:: Pt agreeable to SNF placement CMS Medicare.gov Compare Post Acute Care list provided to:: Patient Choice offered to / list presented to : Patient  Discharge Placement              Patient chooses bed at: [facility] Patient to be transferred to facility by: [facility]tar Name of family member notified: none Patient and family notified of of transfer: 03/26/21  Discharge Plan and Services     Post Acute Care Choice: [place]                               Social Determinants of Health (SDOH) Interventions     Readmission Risk Interventions No flowsheet data found.   Emeterio Reeve, Latanya Presser, [place] Social Worker [phone]

## 2021-03-26 NOTE — Progress Notes (Signed)
[facility] PROGRESS NOTE    Progress Note  Terri Liu  MPN:[id number] DOB: Sep 06, 1934 DOA: 03/16/2021 PCP: Leeroy Cha, MD     Brief Narrative:   Terri Liu is an 87 y.o. female HTN, postherpetic neuralgia, HLD, left hip fracture 01/2017 COVID-pneumonia 09/14/2020 status post remdesivir, steroids HFpEF/HFrEF-Echo 40-45% 12/31/2019 + chronic LBBB. Orthopedics and trauma were consulted-patient deemed her nonoperative candidate     Assessment/Plan:   Accidental fall Closed fracture of proximal end of right humerus, unspecified fracture morphology, initial encounter: Patient is medically stable no changes overnight. Has been accepted to [facility].  Abdominal discomfort nausea: Started on Protonix  Iliopsoas hematoma with bilateral displacement: Aspirin as an outpatient.  Chronic systolic and diastolic heart failure, Continue metoprolol losartan.  Mild leukocytosis likely stress demargination due to fractures:  Normocytic anemia: Follow-up with PCP as an outpatient.  Depression: Continue Trileptal.   DVT prophylaxis: none Family Communication:none Status is: Inpatient  Remains inpatient appropriate because:Hemodynamically unstable  Dispo:  Patient From: Home  Planned Disposition: [place]  Medically stable for discharge: No          Code Status:     Code Status Orders  (From admission, onward)           Start     Ordered   03/17/21 0202  Do not attempt resuscitation (DNR)  Continuous       Question Answer Comment  In the event of cardiac or respiratory ARREST Do not call a "code blue"   In the event of cardiac or respiratory ARREST Do not perform Intubation, CPR, defibrillation or ACLS   In the event of cardiac or respiratory ARREST Use medication by any route, position, wound care, and other measures to relive pain and suffering. May use oxygen, suction and manual treatment of airway obstruction as needed for  comfort.      03/17/21 0204           Code Status History     Date Active Date Inactive Code Status Order ID Comments User Context   09/11/2020 1852 09/14/2020 2301 Full Code [id number]  Cristal Deer, MD ED   01/11/2017 2141 01/14/2017 1844 Full Code [id number]  Etta Quill, DO ED   01/27/2015 1904 01/28/2015 1210 Full Code [id number]  Hayden Pedro, MD Inpatient      Advance Directive Documentation    Flowsheet Row Most Recent Value  Type of Advance Directive Healthcare Power of Attorney  Pre-existing out of facility DNR order (yellow form or pink MOST form) --  "MOST" Form in Place? --         IV Access:   Peripheral IV   Procedures and diagnostic studies:   No results found.   Medical Consultants:   None.   Subjective:    Terri Liu relates pain is controlled no complaints.  Objective:    Vitals:   03/25/21 1023 03/25/21 2021 03/26/21 0041 03/26/21 0539  BP: (!) 149/67 (!) 114/93 (!) 136/53 (!) 154/71  Pulse: (!) 103 79 69 86  Resp: 18 18 17 18   Temp: 97.7 F (36.5 C) 98.8 F (37.1 C) 98.4 F (36.9 C) 99.1 F (37.3 C)  TempSrc: Oral Oral Oral Oral  SpO2: 100% 98% 100% 94%  Weight:    81.3 kg  Height:       SpO2: 94 % O2 Flow Rate (L/min): 1 L/min   Intake/Output Summary (Last 24 hours) at 03/26/2021 0741 Last data filed at 03/26/2021 0542 Gross  per 24 hour  Intake 880 ml  Output 1350 ml  Net -470 ml    Filed Weights   03/24/21 0500 03/25/21 0458 03/26/21 0539  Weight: 81.1 kg 81.3 kg 81.3 kg    Exam: General exam: In no acute distress. Respiratory system: Good air movement and clear to auscultation. Cardiovascular system: S1 & S2 heard, RRR. No JVD. Gastrointestinal system: Abdomen is nondistended, soft and nontender.  Extremities: Left arm in sling Skin: No rashes, lesions or ulcers  Data Reviewed:    Labs: Basic Metabolic Panel: Recent Labs  Lab 03/20/21 0056 03/22/21 0106  NA 130* 131*  K 4.6 4.1  CL 96*  95*  CO2 30 31  GLUCOSE 111* 101*  BUN 10 8  CREATININE 0.57 0.49  CALCIUM 8.5* 8.4*    GFR Estimated Creatinine Clearance: 54 mL/min (by C-G formula based on SCr of 0.49 mg/dL). Liver Function Tests: Recent Labs  Lab 03/20/21 0056 03/22/21 0106  AST 17 22  ALT 15 16  ALKPHOS 34* 42  BILITOT 1.2 1.3*  PROT 4.6* 4.7*  ALBUMIN 2.4* 2.4*    No results for input(s): LIPASE, AMYLASE in the last 168 hours. No results for input(s): AMMONIA in the last 168 hours. Coagulation profile No results for input(s): INR, PROTIME in the last 168 hours. COVID-19 Labs  No results for input(s): DDIMER, FERRITIN, LDH, CRP in the last 72 hours.  Lab Results  Component Value Date   SARSCOV2NAA NEGATIVE 03/25/2021   SARSCOV2NAA NEGATIVE 03/22/2021   SARSCOV2NAA NEGATIVE 03/16/2021   SARSCOV2NAA POSITIVE (A) 09/11/2020    CBC: Recent Labs  Lab 03/20/21 0056 03/22/21 0106  WBC 7.1 8.1  NEUTROABS  --  4.9  HGB 8.8* 8.9*  HCT 25.7* 26.2*  MCV 95.9 94.9  PLT 152 225    Cardiac Enzymes: No results for input(s): CKTOTAL, CKMB, CKMBINDEX, TROPONINI in the last 168 hours. BNP (last 3 results) No results for input(s): PROBNP in the last 8760 hours. CBG: No results for input(s): GLUCAP in the last 168 hours. D-Dimer: No results for input(s): DDIMER in the last 72 hours. Hgb A1c: No results for input(s): HGBA1C in the last 72 hours. Lipid Profile: No results for input(s): CHOL, HDL, LDLCALC, TRIG, CHOLHDL, LDLDIRECT in the last 72 hours. Thyroid function studies: No results for input(s): TSH, T4TOTAL, T3FREE, THYROIDAB in the last 72 hours.  Invalid input(s): FREET3 Anemia work up: No results for input(s): VITAMINB12, FOLATE, FERRITIN, TIBC, IRON, RETICCTPCT in the last 72 hours. Sepsis Labs: Recent Labs  Lab 03/20/21 0056 03/22/21 0106  WBC 7.1 8.1    Microbiology Recent Results (from the past 240 hour(s))  Resp Panel by RT-PCR (Flu A&B, Covid) Nasopharyngeal Swab      Status: None   Collection Time: 03/16/21 11:55 PM   Specimen: Nasopharyngeal Swab; Nasopharyngeal(NP) swabs in vial transport medium  Result Value Ref Range Status   SARS Coronavirus 2 by RT PCR NEGATIVE NEGATIVE Final    Comment: (NOTE) SARS-CoV-2 target nucleic acids are NOT DETECTED.  The SARS-CoV-2 RNA is generally detectable in upper respiratory specimens during the acute phase of infection. The lowest concentration of SARS-CoV-2 viral copies this assay can detect is 138 copies/mL. A negative result does not preclude SARS-Cov-2 infection and should not be used as the sole basis for treatment or other patient management decisions. A negative result may occur with  improper specimen collection/handling, submission of specimen other than nasopharyngeal swab, presence of viral mutation(s) within the areas targeted by this assay,  and inadequate number of viral copies(<138 copies/mL). A negative result must be combined with clinical observations, patient history, and epidemiological information. The expected result is Negative.  Fact Sheet for Patients:  EntrepreneurPulse.com.au  Fact Sheet for Healthcare Providers:  IncredibleEmployment.be  This test is no t yet approved or cleared by the [place] FDA and  has been authorized for detection and/or diagnosis of SARS-CoV-2 by FDA under an Emergency Use Authorization (EUA). This EUA will remain  in effect (meaning this test can be used) for the duration of the COVID-19 declaration under Section 564(b)(1) of the Act, 21 U.S.C.section 360bbb-3(b)(1), unless the authorization is terminated  or revoked sooner.       Influenza A by PCR NEGATIVE NEGATIVE Final   Influenza B by PCR NEGATIVE NEGATIVE Final    Comment: (NOTE) The Xpert Xpress SARS-CoV-2/FLU/RSV plus assay is intended as an aid in the diagnosis of influenza from Nasopharyngeal swab specimens and should not be used as a sole basis  for treatment. Nasal washings and aspirates are unacceptable for Xpert Xpress SARS-CoV-2/FLU/RSV testing.  Fact Sheet for Patients: EntrepreneurPulse.com.au  Fact Sheet for Healthcare Providers: IncredibleEmployment.be  This test is not yet approved or cleared by the [place] FDA and has been authorized for detection and/or diagnosis of SARS-CoV-2 by FDA under an Emergency Use Authorization (EUA). This EUA will remain in effect (meaning this test can be used) for the duration of the COVID-19 declaration under Section 564(b)(1) of the Act, 21 U.S.C. section 360bbb-3(b)(1), unless the authorization is terminated or revoked.  Performed at [facility], [place] [address] [place], [place] [address]   SARS CORONAVIRUS 2 (TAT 6-24 HRS) Nasopharyngeal Nasopharyngeal Swab     Status: None   Collection Time: 03/22/21  5:24 PM   Specimen: Nasopharyngeal Swab  Result Value Ref Range Status   SARS Coronavirus 2 NEGATIVE NEGATIVE Final    Comment: (NOTE) SARS-CoV-2 target nucleic acids are NOT DETECTED.  The SARS-CoV-2 RNA is generally detectable in upper and lower respiratory specimens during the acute phase of infection. Negative results do not preclude SARS-CoV-2 infection, do not rule out co-infections with other pathogens, and should not be used as the sole basis for treatment or other patient management decisions. Negative results must be combined with clinical observations, patient history, and epidemiological information. The expected result is Negative.  Fact Sheet for Patients: SugarRoll.be  Fact Sheet for Healthcare Providers: https://www.woods-mathews.com/  This test is not yet approved or cleared by the [place] FDA and  has been authorized for detection and/or diagnosis of SARS-CoV-2 by FDA under an Emergency Use Authorization (EUA). This EUA will remain  in effect (meaning this  test can be used) for the duration of the COVID-19 declaration under Se ction 564(b)(1) of the Act, 21 U.S.C. section 360bbb-3(b)(1), unless the authorization is terminated or revoked sooner.  Performed at [facility], [place] [address], [place], [place] [address]   Resp Panel by RT-PCR (Flu A&B, Covid) Nasopharyngeal Swab     Status: None   Collection Time: 03/25/21  2:03 PM   Specimen: Nasopharyngeal Swab; Nasopharyngeal(NP) swabs in vial transport medium  Result Value Ref Range Status   SARS Coronavirus 2 by RT PCR NEGATIVE NEGATIVE Final    Comment: (NOTE) SARS-CoV-2 target nucleic acids are NOT DETECTED.  The SARS-CoV-2 RNA is generally detectable in upper respiratory specimens during the acute phase of infection. The lowest concentration of SARS-CoV-2 viral copies this assay can detect is 138 copies/mL. A negative  result does not preclude SARS-Cov-2 infection and should not be used as the sole basis for treatment or other patient management decisions. A negative result may occur with  improper specimen collection/handling, submission of specimen other than nasopharyngeal swab, presence of viral mutation(s) within the areas targeted by this assay, and inadequate number of viral copies(<138 copies/mL). A negative result must be combined with clinical observations, patient history, and epidemiological information. The expected result is Negative.  Fact Sheet for Patients:  EntrepreneurPulse.com.au  Fact Sheet for Healthcare Providers:  IncredibleEmployment.be  This test is no t yet approved or cleared by the [place] FDA and  has been authorized for detection and/or diagnosis of SARS-CoV-2 by FDA under an Emergency Use Authorization (EUA). This EUA will remain  in effect (meaning this test can be used) for the duration of the COVID-19 declaration under Section 564(b)(1) of the Act, 21 U.S.C.section 360bbb-3(b)(1), unless the  authorization is terminated  or revoked sooner.       Influenza A by PCR NEGATIVE NEGATIVE Final   Influenza B by PCR NEGATIVE NEGATIVE Final    Comment: (NOTE) The Xpert Xpress SARS-CoV-2/FLU/RSV plus assay is intended as an aid in the diagnosis of influenza from Nasopharyngeal swab specimens and should not be used as a sole basis for treatment. Nasal washings and aspirates are unacceptable for Xpert Xpress SARS-CoV-2/FLU/RSV testing.  Fact Sheet for Patients: EntrepreneurPulse.com.au  Fact Sheet for Healthcare Providers: IncredibleEmployment.be  This test is not yet approved or cleared by the [place] FDA and has been authorized for detection and/or diagnosis of SARS-CoV-2 by FDA under an Emergency Use Authorization (EUA). This EUA will remain in effect (meaning this test can be used) for the duration of the COVID-19 declaration under Section 564(b)(1) of the Act, 21 U.S.C. section 360bbb-3(b)(1), unless the authorization is terminated or revoked.  Performed at [facility], [place] [address], [place], [place] [address]      Medications:    acetaminophen  1,000 mg Oral Q6H   cholecalciferol  1,000 Units Oral Daily   enoxaparin (LOVENOX) injection  30 mg Subcutaneous Q12H   escitalopram  10 mg Oral Daily   losartan  25 mg Oral Daily   methocarbamol  1,000 mg Oral Q8H   metoprolol tartrate  12.5 mg Oral BID   multivitamin with minerals  1 tablet Oral Daily   OXcarbazepine  150 mg Oral BID   pantoprazole  40 mg Oral Daily   polyethylene glycol  17 g Oral Daily   pravastatin  40 mg Oral q1800   senna-docusate  1 tablet Oral QHS   vitamin B-12  100 mcg Oral Daily   Continuous Infusions:    LOS: 10 days   Charlynne Cousins  [facility]  03/26/2021, 7:41 AM

## 2021-03-29 DIAGNOSIS — M6281 Muscle weakness (generalized): Secondary | ICD-10-CM | POA: Diagnosis not present

## 2021-03-29 DIAGNOSIS — R52 Pain, unspecified: Secondary | ICD-10-CM | POA: Diagnosis not present

## 2021-03-29 DIAGNOSIS — E782 Mixed hyperlipidemia: Secondary | ICD-10-CM | POA: Diagnosis not present

## 2021-03-29 DIAGNOSIS — F329 Major depressive disorder, single episode, unspecified: Secondary | ICD-10-CM | POA: Diagnosis not present

## 2021-03-29 DIAGNOSIS — S32501D Unspecified fracture of right pubis, subsequent encounter for fracture with routine healing: Secondary | ICD-10-CM | POA: Diagnosis not present

## 2021-03-29 DIAGNOSIS — R2681 Unsteadiness on feet: Secondary | ICD-10-CM | POA: Diagnosis not present

## 2021-03-29 DIAGNOSIS — I5042 Chronic combined systolic (congestive) and diastolic (congestive) heart failure: Secondary | ICD-10-CM | POA: Diagnosis not present

## 2021-03-29 DIAGNOSIS — S42201D Unspecified fracture of upper end of right humerus, subsequent encounter for fracture with routine healing: Secondary | ICD-10-CM | POA: Diagnosis not present

## 2021-03-29 DIAGNOSIS — Z9181 History of falling: Secondary | ICD-10-CM | POA: Diagnosis not present

## 2021-03-29 DIAGNOSIS — I10 Essential (primary) hypertension: Secondary | ICD-10-CM | POA: Diagnosis not present

## 2021-03-30 DIAGNOSIS — I5041 Acute combined systolic (congestive) and diastolic (congestive) heart failure: Secondary | ICD-10-CM | POA: Diagnosis not present

## 2021-03-30 DIAGNOSIS — F3489 Other specified persistent mood disorders: Secondary | ICD-10-CM | POA: Diagnosis not present

## 2021-03-30 DIAGNOSIS — S329XXA Fracture of unspecified parts of lumbosacral spine and pelvis, initial encounter for closed fracture: Secondary | ICD-10-CM | POA: Diagnosis not present

## 2021-03-30 DIAGNOSIS — D6489 Other specified anemias: Secondary | ICD-10-CM | POA: Diagnosis not present

## 2021-03-30 DIAGNOSIS — E871 Hypo-osmolality and hyponatremia: Secondary | ICD-10-CM | POA: Diagnosis not present

## 2021-03-30 DIAGNOSIS — S7010XA Contusion of unspecified thigh, initial encounter: Secondary | ICD-10-CM | POA: Diagnosis not present

## 2021-03-30 DIAGNOSIS — S42309A Unspecified fracture of shaft of humerus, unspecified arm, initial encounter for closed fracture: Secondary | ICD-10-CM | POA: Diagnosis not present

## 2021-03-30 DIAGNOSIS — S3210XA Unspecified fracture of sacrum, initial encounter for closed fracture: Secondary | ICD-10-CM | POA: Diagnosis not present

## 2021-03-30 DIAGNOSIS — K219 Gastro-esophageal reflux disease without esophagitis: Secondary | ICD-10-CM | POA: Diagnosis not present

## 2021-03-31 DIAGNOSIS — Z886 Allergy status to analgesic agent status: Secondary | ICD-10-CM | POA: Diagnosis not present

## 2021-03-31 DIAGNOSIS — K219 Gastro-esophageal reflux disease without esophagitis: Secondary | ICD-10-CM | POA: Diagnosis not present

## 2021-04-01 DIAGNOSIS — R52 Pain, unspecified: Secondary | ICD-10-CM | POA: Diagnosis not present

## 2021-04-01 DIAGNOSIS — S42201D Unspecified fracture of upper end of right humerus, subsequent encounter for fracture with routine healing: Secondary | ICD-10-CM | POA: Diagnosis not present

## 2021-04-01 DIAGNOSIS — R2681 Unsteadiness on feet: Secondary | ICD-10-CM | POA: Diagnosis not present

## 2021-04-01 DIAGNOSIS — Z9181 History of falling: Secondary | ICD-10-CM | POA: Diagnosis not present

## 2021-04-01 DIAGNOSIS — M6281 Muscle weakness (generalized): Secondary | ICD-10-CM | POA: Diagnosis not present

## 2021-04-01 DIAGNOSIS — F329 Major depressive disorder, single episode, unspecified: Secondary | ICD-10-CM | POA: Diagnosis not present

## 2021-04-01 DIAGNOSIS — I5042 Chronic combined systolic (congestive) and diastolic (congestive) heart failure: Secondary | ICD-10-CM | POA: Diagnosis not present

## 2021-04-01 DIAGNOSIS — S32501D Unspecified fracture of right pubis, subsequent encounter for fracture with routine healing: Secondary | ICD-10-CM | POA: Diagnosis not present

## 2021-04-01 DIAGNOSIS — E782 Mixed hyperlipidemia: Secondary | ICD-10-CM | POA: Diagnosis not present

## 2021-04-01 DIAGNOSIS — I10 Essential (primary) hypertension: Secondary | ICD-10-CM | POA: Diagnosis not present

## 2021-04-02 DIAGNOSIS — S42309D Unspecified fracture of shaft of humerus, unspecified arm, subsequent encounter for fracture with routine healing: Secondary | ICD-10-CM | POA: Diagnosis not present

## 2021-04-02 DIAGNOSIS — E871 Hypo-osmolality and hyponatremia: Secondary | ICD-10-CM | POA: Diagnosis not present

## 2021-04-02 DIAGNOSIS — I5041 Acute combined systolic (congestive) and diastolic (congestive) heart failure: Secondary | ICD-10-CM | POA: Diagnosis not present

## 2021-04-02 DIAGNOSIS — S329XXD Fracture of unspecified parts of lumbosacral spine and pelvis, subsequent encounter for fracture with routine healing: Secondary | ICD-10-CM | POA: Diagnosis not present

## 2021-04-02 DIAGNOSIS — S3210XD Unspecified fracture of sacrum, subsequent encounter for fracture with routine healing: Secondary | ICD-10-CM | POA: Diagnosis not present

## 2021-04-02 DIAGNOSIS — S7010XD Contusion of unspecified thigh, subsequent encounter: Secondary | ICD-10-CM | POA: Diagnosis not present

## 2021-04-05 DIAGNOSIS — S42201D Unspecified fracture of upper end of right humerus, subsequent encounter for fracture with routine healing: Secondary | ICD-10-CM | POA: Diagnosis not present

## 2021-04-05 DIAGNOSIS — R2681 Unsteadiness on feet: Secondary | ICD-10-CM | POA: Diagnosis not present

## 2021-04-05 DIAGNOSIS — S32501D Unspecified fracture of right pubis, subsequent encounter for fracture with routine healing: Secondary | ICD-10-CM | POA: Diagnosis not present

## 2021-04-05 DIAGNOSIS — I10 Essential (primary) hypertension: Secondary | ICD-10-CM | POA: Diagnosis not present

## 2021-04-05 DIAGNOSIS — I5042 Chronic combined systolic (congestive) and diastolic (congestive) heart failure: Secondary | ICD-10-CM | POA: Diagnosis not present

## 2021-04-05 DIAGNOSIS — E782 Mixed hyperlipidemia: Secondary | ICD-10-CM | POA: Diagnosis not present

## 2021-04-05 DIAGNOSIS — R52 Pain, unspecified: Secondary | ICD-10-CM | POA: Diagnosis not present

## 2021-04-05 DIAGNOSIS — M6281 Muscle weakness (generalized): Secondary | ICD-10-CM | POA: Diagnosis not present

## 2021-04-05 DIAGNOSIS — F329 Major depressive disorder, single episode, unspecified: Secondary | ICD-10-CM | POA: Diagnosis not present

## 2021-04-05 DIAGNOSIS — Z9181 History of falling: Secondary | ICD-10-CM | POA: Diagnosis not present

## 2021-04-08 DIAGNOSIS — I10 Essential (primary) hypertension: Secondary | ICD-10-CM | POA: Diagnosis not present

## 2021-04-08 DIAGNOSIS — Z9181 History of falling: Secondary | ICD-10-CM | POA: Diagnosis not present

## 2021-04-08 DIAGNOSIS — S32501D Unspecified fracture of right pubis, subsequent encounter for fracture with routine healing: Secondary | ICD-10-CM | POA: Diagnosis not present

## 2021-04-08 DIAGNOSIS — R2681 Unsteadiness on feet: Secondary | ICD-10-CM | POA: Diagnosis not present

## 2021-04-08 DIAGNOSIS — F329 Major depressive disorder, single episode, unspecified: Secondary | ICD-10-CM | POA: Diagnosis not present

## 2021-04-08 DIAGNOSIS — E782 Mixed hyperlipidemia: Secondary | ICD-10-CM | POA: Diagnosis not present

## 2021-04-08 DIAGNOSIS — M6281 Muscle weakness (generalized): Secondary | ICD-10-CM | POA: Diagnosis not present

## 2021-04-08 DIAGNOSIS — R52 Pain, unspecified: Secondary | ICD-10-CM | POA: Diagnosis not present

## 2021-04-08 DIAGNOSIS — S42201D Unspecified fracture of upper end of right humerus, subsequent encounter for fracture with routine healing: Secondary | ICD-10-CM | POA: Diagnosis not present

## 2021-04-08 DIAGNOSIS — I5042 Chronic combined systolic (congestive) and diastolic (congestive) heart failure: Secondary | ICD-10-CM | POA: Diagnosis not present

## 2021-04-14 DIAGNOSIS — S329XXD Fracture of unspecified parts of lumbosacral spine and pelvis, subsequent encounter for fracture with routine healing: Secondary | ICD-10-CM | POA: Diagnosis not present

## 2021-04-14 DIAGNOSIS — I5040 Unspecified combined systolic (congestive) and diastolic (congestive) heart failure: Secondary | ICD-10-CM | POA: Diagnosis not present

## 2021-04-14 DIAGNOSIS — S42309D Unspecified fracture of shaft of humerus, unspecified arm, subsequent encounter for fracture with routine healing: Secondary | ICD-10-CM | POA: Diagnosis not present

## 2021-04-14 DIAGNOSIS — S3210XD Unspecified fracture of sacrum, subsequent encounter for fracture with routine healing: Secondary | ICD-10-CM | POA: Diagnosis not present

## 2021-04-15 DIAGNOSIS — M6281 Muscle weakness (generalized): Secondary | ICD-10-CM | POA: Diagnosis not present

## 2021-04-19 DIAGNOSIS — Z96653 Presence of artificial knee joint, bilateral: Secondary | ICD-10-CM | POA: Diagnosis not present

## 2021-04-19 DIAGNOSIS — S3210XD Unspecified fracture of sacrum, subsequent encounter for fracture with routine healing: Secondary | ICD-10-CM | POA: Diagnosis not present

## 2021-04-19 DIAGNOSIS — E871 Hypo-osmolality and hyponatremia: Secondary | ICD-10-CM | POA: Diagnosis not present

## 2021-04-19 DIAGNOSIS — Z9181 History of falling: Secondary | ICD-10-CM | POA: Diagnosis not present

## 2021-04-19 DIAGNOSIS — I5042 Chronic combined systolic (congestive) and diastolic (congestive) heart failure: Secondary | ICD-10-CM | POA: Diagnosis not present

## 2021-04-19 DIAGNOSIS — S329XXD Fracture of unspecified parts of lumbosacral spine and pelvis, subsequent encounter for fracture with routine healing: Secondary | ICD-10-CM | POA: Diagnosis not present

## 2021-04-19 DIAGNOSIS — E785 Hyperlipidemia, unspecified: Secondary | ICD-10-CM | POA: Diagnosis not present

## 2021-04-19 DIAGNOSIS — S42301D Unspecified fracture of shaft of humerus, right arm, subsequent encounter for fracture with routine healing: Secondary | ICD-10-CM | POA: Diagnosis not present

## 2021-04-19 DIAGNOSIS — I447 Left bundle-branch block, unspecified: Secondary | ICD-10-CM | POA: Diagnosis not present

## 2021-04-19 DIAGNOSIS — Z7982 Long term (current) use of aspirin: Secondary | ICD-10-CM | POA: Diagnosis not present

## 2021-04-19 DIAGNOSIS — I110 Hypertensive heart disease with heart failure: Secondary | ICD-10-CM | POA: Diagnosis not present

## 2021-04-19 DIAGNOSIS — Z8616 Personal history of COVID-19: Secondary | ICD-10-CM | POA: Diagnosis not present

## 2021-04-19 DIAGNOSIS — F32A Depression, unspecified: Secondary | ICD-10-CM | POA: Diagnosis not present

## 2021-04-26 DIAGNOSIS — S42291A Other displaced fracture of upper end of right humerus, initial encounter for closed fracture: Secondary | ICD-10-CM | POA: Diagnosis not present

## 2021-04-26 DIAGNOSIS — S32591A Other specified fracture of right pubis, initial encounter for closed fracture: Secondary | ICD-10-CM | POA: Diagnosis not present

## 2021-04-29 DIAGNOSIS — E785 Hyperlipidemia, unspecified: Secondary | ICD-10-CM | POA: Diagnosis not present

## 2021-04-29 DIAGNOSIS — R262 Difficulty in walking, not elsewhere classified: Secondary | ICD-10-CM | POA: Diagnosis not present

## 2021-04-29 DIAGNOSIS — I5022 Chronic systolic (congestive) heart failure: Secondary | ICD-10-CM | POA: Diagnosis not present

## 2021-04-29 DIAGNOSIS — I10 Essential (primary) hypertension: Secondary | ICD-10-CM | POA: Diagnosis not present

## 2021-05-14 DIAGNOSIS — E785 Hyperlipidemia, unspecified: Secondary | ICD-10-CM | POA: Diagnosis not present

## 2021-05-14 DIAGNOSIS — I10 Essential (primary) hypertension: Secondary | ICD-10-CM | POA: Diagnosis not present

## 2021-05-14 DIAGNOSIS — I5022 Chronic systolic (congestive) heart failure: Secondary | ICD-10-CM | POA: Diagnosis not present

## 2021-05-14 DIAGNOSIS — R262 Difficulty in walking, not elsewhere classified: Secondary | ICD-10-CM | POA: Diagnosis not present

## 2021-06-01 DIAGNOSIS — E785 Hyperlipidemia, unspecified: Secondary | ICD-10-CM | POA: Diagnosis not present

## 2021-06-01 DIAGNOSIS — I5022 Chronic systolic (congestive) heart failure: Secondary | ICD-10-CM | POA: Diagnosis not present

## 2021-06-01 DIAGNOSIS — R262 Difficulty in walking, not elsewhere classified: Secondary | ICD-10-CM | POA: Diagnosis not present

## 2021-06-01 DIAGNOSIS — I10 Essential (primary) hypertension: Secondary | ICD-10-CM | POA: Diagnosis not present

## 2021-06-07 DIAGNOSIS — W19XXXS Unspecified fall, sequela: Secondary | ICD-10-CM | POA: Diagnosis not present

## 2021-06-07 DIAGNOSIS — M25512 Pain in left shoulder: Secondary | ICD-10-CM | POA: Diagnosis not present

## 2021-06-07 DIAGNOSIS — I5042 Chronic combined systolic (congestive) and diastolic (congestive) heart failure: Secondary | ICD-10-CM | POA: Diagnosis not present

## 2021-06-07 DIAGNOSIS — Z9181 History of falling: Secondary | ICD-10-CM | POA: Diagnosis not present

## 2021-06-07 DIAGNOSIS — I10 Essential (primary) hypertension: Secondary | ICD-10-CM | POA: Diagnosis not present

## 2021-06-07 DIAGNOSIS — F3341 Major depressive disorder, recurrent, in partial remission: Secondary | ICD-10-CM | POA: Diagnosis not present

## 2021-06-07 DIAGNOSIS — Z23 Encounter for immunization: Secondary | ICD-10-CM | POA: Diagnosis not present

## 2021-06-11 DIAGNOSIS — M25551 Pain in right hip: Secondary | ICD-10-CM | POA: Diagnosis not present

## 2021-06-11 DIAGNOSIS — M25511 Pain in right shoulder: Secondary | ICD-10-CM | POA: Diagnosis not present

## 2021-06-18 ENCOUNTER — Other Ambulatory Visit: Payer: Self-pay | Admitting: Orthopedic Surgery

## 2021-06-18 DIAGNOSIS — Z01811 Encounter for preprocedural respiratory examination: Secondary | ICD-10-CM

## 2021-06-18 DIAGNOSIS — M25511 Pain in right shoulder: Secondary | ICD-10-CM | POA: Diagnosis not present

## 2021-06-18 DIAGNOSIS — M19012 Primary osteoarthritis, left shoulder: Secondary | ICD-10-CM | POA: Diagnosis not present

## 2021-06-21 ENCOUNTER — Other Ambulatory Visit: Payer: Self-pay | Admitting: Orthopedic Surgery

## 2021-06-21 ENCOUNTER — Telehealth: Payer: Self-pay | Admitting: Internal Medicine

## 2021-06-21 DIAGNOSIS — M25511 Pain in right shoulder: Secondary | ICD-10-CM

## 2021-06-21 NOTE — Telephone Encounter (Signed)
   [facility] Pre-operative Risk Assessment    Request for surgical clearance:  What type of surgery is being performed?  Right Reverse Total Shoulder   When is this surgery scheduled?  07/01/21   What type of clearance is required (medical clearance vs. Pharmacy clearance to hold med vs. Both)?  Both   Are there any medications that need to be held prior to surgery and how long?  Their office is requesting our recommendation on medication Practice name and name of physician performing surgery?  [facility]  Dr. Tamera Punt  What is your office phone number? [phone]   7.   What is your office fax number? [phone]  8.   Anesthesia type (None, local, MAC, general) ?  Choice    Terri Liu 06/21/2021, 10:46 AM

## 2021-06-21 NOTE — Telephone Encounter (Addendum)
   Name: Terri Liu  DOB: [birth year]  MRN: [id number]   Primary Cardiologist: Elouise Munroe, MD  Chart reviewed as part of pre-operative protocol coverage. Patient was contacted 06/21/2021 in reference to pre-operative risk assessment for pending surgery as outlined below.  Terri Liu was last seen on 10/01/2020 by Dr. Margaretann Loveless.  Since that day, Terri Liu has done well without any chest pain or worsening dyspnea.  Functional ability is limited due to recent hip fracture and shoulder injury after the fall.  Nuclear stress test obtained in August 2021 showed no significant ischemia.   Therefore, based on ACC/AHA guidelines, the patient would be at acceptable risk for the planned procedure without further cardiovascular testing.   The patient was advised that if she develops new symptoms prior to surgery to contact our office to arrange for a follow-up visit, and she verbalized understanding.  I will route this recommendation to the requesting party via Epic fax function and remove from pre-op pool. Please call with questions.  [place], [place] 06/21/2021, 3:09 PM

## 2021-06-21 NOTE — Progress Notes (Signed)
Sent message, via epic in basket, requesting orders in epic from surgeon.  

## 2021-06-22 NOTE — Patient Instructions (Addendum)
DUE TO COVID-19 ONLY ONE VISITOR IS ALLOWED TO COME WITH YOU AND STAY IN THE WAITING ROOM ONLY DURING PRE OP AND PROCEDURE DAY OF SURGERY IF YOU ARE GOING HOME AFTER SURGERY. IF YOU ARE SPENDING THE NIGHT 2 PEOPLE MAY VISIT WITH YOU IN YOUR PRIVATE ROOM AFTER SURGERY UNTIL VISITING  HOURS ARE OVER AT 8:00 PM AND 1 VISITOR CAN SPEND THE NIGHT.   YOU NEED TO HAVE A COVID 19 TEST ON__10/18_ THIS TEST MUST BE DONE BEFORE SURGERY,  COVID TESTING SITE  IS LOCATED AT [place], [place]. REMAIN IN YOUR CAR THIS IS A DRIVE UP TEST. AFTER YOUR COVID TEST PLEASE WEAR A MASK OUT IN PUBLIC AND SOCIAL DISTANCE AND [place] YOUR HANDS FREQUENTLY, ALSO ASK ALL YOUR CLOSE CONTACT PERSONS TO WEAR A MASK AND SOCIAL DISTANCE AND [place] THEIR HANDS FREQUENTLY ALSO.               Terri Liu     Your procedure is scheduled on: 07/01/21   Report to [facility] Main  Entrance   Report to admitting at   6:45 AM     Call this number if you have problems the morning of surgery [phone]    No food after midnight.    You may have clear liquid until 6:30 AM.    At 6:00 AM drink pre surgery drink.   Nothing by mouth after 630 AM.   CLEAR LIQUID DIET   Foods Allowed                                                                     Foods Excluded     Coffee no milk or creamer                                                                                     liquids that you cannot  Plain Jell-O any favor except red or purple                                           see through such as: Fruit ices (not with fruit pulp)                                     milk, soups, orange juice  Iced Popsicles                                    All solid food Carbonated beverages, regular and diet  Cranberry, grape and apple juices Sports drinks like Gatorade Lightly seasoned clear broth or consume(fat free) Sugar     BRUSH YOUR TEETH MORNING OF SURGERY AND  RINSE YOUR MOUTH OUT, NO CHEWING GUM CANDY OR MINTS.     Take these medicines the morning of surgery with A SIP OF WATER: Lexapro, Metoprolol, Oxcarbazepine                                You may not have any metal on your body including hair pins and              piercings  Do not wear jewelry, make-up, lotions, powders or perfumes, deodorant             Do not wear nail polish on your fingernails.  Do not shave  48 hours prior to surgery.                 Do not bring valuables to the hospital. [place].  Contacts, dentures or bridgework may not be worn into surgery.       Patients discharged the day of surgery will not be allowed to drive home.   IF YOU ARE HAVING SURGERY AND GOING HOME THE SAME DAY, YOU MUST HAVE AN ADULT TO DRIVE YOU HOME AND BE WITH YOU FOR 24 HOURS. YOU MAY GO HOME BY TAXI OR UBER OR ORTHERWISE, BUT AN ADULT MUST ACCOMPANY YOU HOME AND STAY WITH YOU FOR 24 HOURS.  Name and phone number of your driver:  Special Instructions: N/A              Please read over the following fact sheets you were given: _____________________________________________________________________  [facility]- Preparing for Total Shoulder Arthroplasty    Before surgery, you can play an important role. Because skin is not sterile, your skin needs to be as free of germs as possible. You can reduce the number of germs on your skin by using the following products. Benzoyl Peroxide Gel Reduces the number of germs present on the skin Applied twice a day to shoulder area starting two days before surgery    ==================================================================  Please follow these instructions carefully:  BENZOYL PEROXIDE 5% GEL  Please do not use if you have an allergy to benzoyl peroxide.   If your skin becomes reddened/irritated stop using the benzoyl peroxide.  Starting two days before surgery, apply as follows: Apply  benzoyl peroxide in the morning and at night. Apply after taking a shower. If you are not taking a shower clean entire shoulder front, back, and side along with the armpit with a clean wet washcloth.  Place a quarter-sized dollop on your shoulder and rub in thoroughly, making sure to cover the front, back, and side of your shoulder, along with the armpit.   2 days before ____ AM   ____ PM              1 day before ____ AM   ____ PM                         Do this twice a day for two days.  (Last application is the night before surgery, AFTER using the CHG soap as described below).  Do NOT apply benzoyl peroxide gel on the day  of surgery.            [place] - Preparing for Surgery Before surgery, you can play an important role.  Because skin is not sterile, your skin needs to be as free of germs as possible.  You can reduce the number of germs on your skin by washing with CHG (chlorahexidine gluconate) soap before surgery.  CHG is an antiseptic cleaner which kills germs and bonds with the skin to continue killing germs even after washing. Please DO NOT use if you have an allergy to CHG or antibacterial soaps.  If your skin becomes reddened/irritated stop using the CHG and inform your nurse when you arrive at Short Stay. Do not shave (including legs and underarms) for at least 48 hours prior to the first CHG shower.   Please follow these instructions carefully:  1.  Shower with CHG Soap the night before surgery and the  morning of Surgery.  2.  If you choose to wash your hair, wash your hair first as usual with your  normal  shampoo.  3.  After you shampoo, rinse your hair and body thoroughly to remove the  shampoo.                            4.  Use CHG as you would any other liquid soap.  You can apply chg directly  to the skin and wash                       Gently with a scrungie or clean washcloth.  5.  Apply the CHG Soap to your body ONLY FROM THE NECK DOWN.   Do not use on face/ open                            Wound or open sores. Avoid contact with eyes, ears mouth and genitals (private parts).                       Wash face,  Genitals (private parts) with your normal soap.             6.  Wash thoroughly, paying special attention to the area where your surgery  will be performed.  7.  Thoroughly rinse your body with warm water from the neck down.  8.  DO NOT shower/wash with your normal soap after using and rinsing off  the CHG Soap.                9.  Pat yourself dry with a clean towel.            10.  Wear clean pajamas.            11.  Place clean sheets on your bed the night of your first shower and do not  sleep with pets. Day of Surgery : Do not apply any lotions/deodorants the morning of surgery.  Please wear clean clothes to the hospital/surgery center.  FAILURE TO FOLLOW THESE INSTRUCTIONS MAY RESULT IN THE CANCELLATION OF YOUR SURGERY PATIENT SIGNATURE_________________________________  NURSE SIGNATURE__________________________________  ________________________________________________________________________

## 2021-06-24 ENCOUNTER — Encounter (HOSPITAL_COMMUNITY): Payer: Self-pay

## 2021-06-24 ENCOUNTER — Ambulatory Visit (HOSPITAL_COMMUNITY)
Admission: RE | Admit: 2021-06-24 | Discharge: 2021-06-24 | Disposition: A | Discharge status: Home / Self Care | Payer: PPO | Source: Ambulatory Visit | Attending: Orthopedic Surgery | Admitting: Orthopedic Surgery

## 2021-06-24 ENCOUNTER — Encounter (HOSPITAL_COMMUNITY)
Admission: RE | Admit: 2021-06-24 | Discharge: 2021-06-24 | Disposition: A | Discharge status: Home / Self Care | Payer: PPO | Source: Ambulatory Visit | Attending: Orthopedic Surgery | Admitting: Orthopedic Surgery

## 2021-06-24 ENCOUNTER — Other Ambulatory Visit: Payer: Self-pay

## 2021-06-24 DIAGNOSIS — Z01811 Encounter for preprocedural respiratory examination: Secondary | ICD-10-CM | POA: Insufficient documentation

## 2021-06-24 DIAGNOSIS — Z01818 Encounter for other preprocedural examination: Secondary | ICD-10-CM | POA: Diagnosis not present

## 2021-06-24 LAB — CBC WITH DIFFERENTIAL/PLATELET
Abs Immature Granulocytes: 0.02 10*3/uL (ref 0.00–0.07)
Basophils Absolute: 0 10*3/uL (ref 0.0–0.1)
Basophils Relative: 0 %
Eosinophils Absolute: 0.1 10*3/uL (ref 0.0–0.5)
Eosinophils Relative: 1 %
HCT: 44.6 % (ref 36.0–46.0)
Hemoglobin: 14.8 g/dL (ref 12.0–15.0)
Immature Granulocytes: 0 %
Lymphocytes Relative: 33 %
Lymphs Abs: 2.2 10*3/uL (ref 0.7–4.0)
MCH: 32.3 pg (ref 26.0–34.0)
MCHC: 33.2 g/dL (ref 30.0–36.0)
MCV: 97.4 fL (ref 80.0–100.0)
Monocytes Absolute: 0.8 10*3/uL (ref 0.1–1.0)
Monocytes Relative: 12 %
Neutro Abs: 3.5 10*3/uL (ref 1.7–7.7)
Neutrophils Relative %: 54 %
Platelets: 260 10*3/uL (ref 150–400)
RBC: 4.58 MIL/uL (ref 3.87–5.11)
RDW: 12.3 % (ref 11.5–15.5)
WBC: 6.5 10*3/uL (ref 4.0–10.5)
nRBC: 0 % (ref 0.0–0.2)

## 2021-06-24 LAB — URINALYSIS, ROUTINE W REFLEX MICROSCOPIC
Bacteria, UA: NONE SEEN
Bilirubin Urine: NEGATIVE
Glucose, UA: NEGATIVE mg/dL
Hgb urine dipstick: NEGATIVE
Ketones, ur: NEGATIVE mg/dL
Nitrite: NEGATIVE
Protein, ur: NEGATIVE mg/dL
Specific Gravity, Urine: 1.014 (ref 1.005–1.030)
pH: 6 (ref 5.0–8.0)

## 2021-06-24 LAB — COMPREHENSIVE METABOLIC PANEL
ALT: 16 U/L (ref 0–44)
AST: 24 U/L (ref 15–41)
Albumin: 4.1 g/dL (ref 3.5–5.0)
Alkaline Phosphatase: 80 U/L (ref 38–126)
Anion gap: 6 (ref 5–15)
BUN: 8 mg/dL (ref 8–23)
CO2: 33 mmol/L — ABNORMAL HIGH (ref 22–32)
Calcium: 9.6 mg/dL (ref 8.9–10.3)
Chloride: 96 mmol/L — ABNORMAL LOW (ref 98–111)
Creatinine, Ser: 0.38 mg/dL — ABNORMAL LOW (ref 0.44–1.00)
GFR, Estimated: >60 mL/min (ref >60)
Glucose, Bld: 94 mg/dL (ref 70–99)
Potassium: 4.1 mmol/L (ref 3.5–5.1)
Sodium: 135 mmol/L (ref 135–145)
Total Bilirubin: 0.9 mg/dL (ref 0.3–1.2)
Total Protein: 6.7 g/dL (ref 6.5–8.1)

## 2021-06-24 LAB — SURGICAL PCR SCREEN
MRSA, PCR: NEGATIVE
Staphylococcus aureus: NEGATIVE

## 2021-06-24 NOTE — Progress Notes (Signed)
COVID Test- 06/29/21    PCP - Dr. Charmaine Downs Cardiologist - Dr. Marcina Millard clearance 06/21/21 epic  Chest x-ray - no EKG - 03/16/21-epic Stress Test - 04/17/20-epic ECHO - 12/20/19-epic Cardiac Cath - no Pacemaker/ICD device last checked:NA  Sleep Study - no CPAP -   Fasting Blood Sugar - NA Checks Blood Sugar _____ times a day  Blood Thinner Instructions:NA Aspirin Instructions: Last Dose:  Anesthesia review: yes  Patient denies shortness of breath, fever, cough and chest pain at PAT appointment Pt had Covid in Jan 2022. Her lungs don't feel 100% yet but are much better. She had 2 significant losses in her life this year.In July she fell and broke her pelvis. She still has pain. She developed a tremor in her arms  Patient verbalized understanding of instructions that were given to them at the PAT appointment. Patient was also instructed that they will need to review over the PAT instructions again at home before surgery. yes

## 2021-06-25 ENCOUNTER — Ambulatory Visit
Admission: RE | Admit: 2021-06-25 | Discharge: 2021-06-25 | Disposition: A | Discharge status: Home / Self Care | Payer: PPO | Source: Ambulatory Visit | Attending: Orthopedic Surgery | Admitting: Orthopedic Surgery

## 2021-06-25 DIAGNOSIS — M25511 Pain in right shoulder: Secondary | ICD-10-CM

## 2021-06-25 DIAGNOSIS — Z01818 Encounter for other preprocedural examination: Secondary | ICD-10-CM | POA: Diagnosis not present

## 2021-06-25 DIAGNOSIS — M19011 Primary osteoarthritis, right shoulder: Secondary | ICD-10-CM | POA: Diagnosis not present

## 2021-06-29 ENCOUNTER — Other Ambulatory Visit: Payer: Self-pay | Admitting: Orthopedic Surgery

## 2021-06-30 LAB — SARS CORONAVIRUS 2 (TAT 6-24 HRS): SARS Coronavirus 2: NEGATIVE

## 2021-07-01 ENCOUNTER — Other Ambulatory Visit: Payer: Self-pay

## 2021-07-01 ENCOUNTER — Ambulatory Visit (HOSPITAL_COMMUNITY): Payer: PPO | Admitting: Certified Registered Nurse Anesthetist

## 2021-07-01 ENCOUNTER — Observation Stay (HOSPITAL_COMMUNITY)
Admission: RE | Admit: 2021-07-01 | Discharge: 2021-07-02 | Disposition: A | Discharge status: Home / Self Care | Payer: PPO | Attending: Orthopedic Surgery | Admitting: Orthopedic Surgery

## 2021-07-01 ENCOUNTER — Observation Stay (HOSPITAL_COMMUNITY): Payer: PPO

## 2021-07-01 ENCOUNTER — Encounter (HOSPITAL_COMMUNITY)
Admission: RE | Disposition: A | Discharge status: Home / Self Care | Payer: Self-pay | Source: Home / Self Care | Attending: Orthopedic Surgery

## 2021-07-01 ENCOUNTER — Encounter (HOSPITAL_COMMUNITY): Payer: Self-pay | Admitting: Orthopedic Surgery

## 2021-07-01 DIAGNOSIS — G8918 Other acute postprocedural pain: Secondary | ICD-10-CM | POA: Diagnosis not present

## 2021-07-01 DIAGNOSIS — Z96653 Presence of artificial knee joint, bilateral: Secondary | ICD-10-CM | POA: Insufficient documentation

## 2021-07-01 DIAGNOSIS — S42201K Unspecified fracture of upper end of right humerus, subsequent encounter for fracture with nonunion: Secondary | ICD-10-CM | POA: Diagnosis not present

## 2021-07-01 DIAGNOSIS — X58XXXA Exposure to other specified factors, initial encounter: Secondary | ICD-10-CM | POA: Diagnosis not present

## 2021-07-01 DIAGNOSIS — I10 Essential (primary) hypertension: Secondary | ICD-10-CM | POA: Insufficient documentation

## 2021-07-01 DIAGNOSIS — M19012 Primary osteoarthritis, left shoulder: Secondary | ICD-10-CM | POA: Diagnosis not present

## 2021-07-01 DIAGNOSIS — Z96611 Presence of right artificial shoulder joint: Secondary | ICD-10-CM

## 2021-07-01 DIAGNOSIS — Z471 Aftercare following joint replacement surgery: Secondary | ICD-10-CM | POA: Diagnosis not present

## 2021-07-01 DIAGNOSIS — Z79899 Other long term (current) drug therapy: Secondary | ICD-10-CM | POA: Diagnosis not present

## 2021-07-01 DIAGNOSIS — S42291K Other displaced fracture of upper end of right humerus, subsequent encounter for fracture with nonunion: Secondary | ICD-10-CM | POA: Diagnosis not present

## 2021-07-01 DIAGNOSIS — E7800 Pure hypercholesterolemia, unspecified: Secondary | ICD-10-CM | POA: Diagnosis not present

## 2021-07-01 HISTORY — PX: REVERSE SHOULDER ARTHROPLASTY: SHX5054

## 2021-07-01 HISTORY — PX: SHOULDER INJECTION: SHX5048

## 2021-07-01 LAB — TYPE AND SCREEN
ABO/RH(D): A POS
Antibody Screen: NEGATIVE

## 2021-07-01 SURGERY — ARTHROPLASTY, SHOULDER, TOTAL, REVERSE
Anesthesia: General | Site: Shoulder | Laterality: Right

## 2021-07-01 MED ORDER — POTASSIUM CHLORIDE IN NACL 20-0.45 MEQ/L-% IV SOLN
INTRAVENOUS | Status: DC
  Filled 2021-07-01 (×3): qty 1000

## 2021-07-01 MED ORDER — TRIAMCINOLONE ACETONIDE 40 MG/ML IJ SUSP
INTRAMUSCULAR | Status: DC | PRN
  Administered 2021-07-01: 40 mg via INTRA_ARTICULAR

## 2021-07-01 MED ORDER — LACTATED RINGERS IV SOLN: INTRAVENOUS | Status: DC

## 2021-07-01 MED ORDER — TRIAMCINOLONE ACETONIDE 40 MG/ML IJ SUSP
INTRAMUSCULAR | Status: AC
  Filled 2021-07-01: qty 1

## 2021-07-01 MED ORDER — ASPIRIN EC 81 MG PO TBEC
81.0000 mg | DELAYED_RELEASE_TABLET | Freq: Every day | ORAL | Status: DC
  Administered 2021-07-02: 81 mg via ORAL
  Filled 2021-07-01: qty 1

## 2021-07-01 MED ORDER — EPHEDRINE SULFATE-NACL 50-0.9 MG/10ML-% IV SOSY
PREFILLED_SYRINGE | INTRAVENOUS | Status: DC | PRN
  Administered 2021-07-01: 5 mg via INTRAVENOUS
  Administered 2021-07-01 (×2): 10 mg via INTRAVENOUS
  Administered 2021-07-01: 15 mg via INTRAVENOUS

## 2021-07-01 MED ORDER — PHENOL 1.4 % MT LIQD: 1.0000 | OROMUCOSAL | Status: DC | PRN

## 2021-07-01 MED ORDER — CEFAZOLIN SODIUM-DEXTROSE 2-4 GM/100ML-% IV SOLN
2.0000 g | INTRAVENOUS | Status: AC
  Administered 2021-07-01: 2 g via INTRAVENOUS
  Filled 2021-07-01: qty 100

## 2021-07-01 MED ORDER — ACETAMINOPHEN 325 MG PO TABS: 325.0000 mg | ORAL_TABLET | Freq: Four times a day (QID) | ORAL | Status: DC | PRN

## 2021-07-01 MED ORDER — BUPIVACAINE HCL (PF) 0.5 % IJ SOLN
INTRAMUSCULAR | Status: DC | PRN
  Administered 2021-07-01: 15 mL via PERINEURAL

## 2021-07-01 MED ORDER — HYDROMORPHONE HCL 1 MG/ML IJ SOLN: 0.5000 mg | INTRAMUSCULAR | Status: DC | PRN

## 2021-07-01 MED ORDER — MENTHOL 3 MG MT LOZG: 1.0000 | LOZENGE | OROMUCOSAL | Status: DC | PRN

## 2021-07-01 MED ORDER — METOCLOPRAMIDE HCL 5 MG/ML IJ SOLN: 5.0000 mg | Freq: Three times a day (TID) | INTRAMUSCULAR | Status: DC | PRN

## 2021-07-01 MED ORDER — FENTANYL CITRATE PF 50 MCG/ML IJ SOSY
50.0000 ug | PREFILLED_SYRINGE | INTRAMUSCULAR | Status: DC
Start: 2021-07-01 — End: 2021-07-01
  Administered 2021-07-01 (×2): 50 ug via INTRAVENOUS
  Filled 2021-07-01: qty 2

## 2021-07-01 MED ORDER — METOCLOPRAMIDE HCL 5 MG PO TABS: 5.0000 mg | ORAL_TABLET | Freq: Three times a day (TID) | ORAL | Status: DC | PRN

## 2021-07-01 MED ORDER — OXYCODONE HCL 5 MG PO TABS: 5.0000 mg | ORAL_TABLET | ORAL | Status: DC | PRN

## 2021-07-01 MED ORDER — METHOCARBAMOL 500 MG PO TABS: 500.0000 mg | ORAL_TABLET | Freq: Four times a day (QID) | ORAL | Status: DC | PRN

## 2021-07-01 MED ORDER — METOPROLOL TARTRATE 25 MG PO TABS
25.0000 mg | ORAL_TABLET | Freq: Two times a day (BID) | ORAL | Status: DC
  Administered 2021-07-02: 25 mg via ORAL
  Filled 2021-07-01: qty 1

## 2021-07-01 MED ORDER — BUPIVACAINE LIPOSOME 1.3 % IJ SUSP
INTRAMUSCULAR | Status: DC | PRN
  Administered 2021-07-01: 10 mL via PERINEURAL

## 2021-07-01 MED ORDER — POLYVINYL ALCOHOL 1.4 % OP SOLN
1.0000 [drp] | Freq: Every day | OPHTHALMIC | Status: DC | PRN
  Filled 2021-07-01: qty 15

## 2021-07-01 MED ORDER — LIDOCAINE 2% (20 MG/ML) 5 ML SYRINGE
INTRAMUSCULAR | Status: DC | PRN
  Administered 2021-07-01: 60 mg via INTRAVENOUS

## 2021-07-01 MED ORDER — ONDANSETRON HCL 4 MG/2ML IJ SOLN: 4.0000 mg | Freq: Four times a day (QID) | INTRAMUSCULAR | Status: DC | PRN

## 2021-07-01 MED ORDER — OXYCODONE HCL 5 MG PO TABS: 10.0000 mg | ORAL_TABLET | ORAL | Status: DC | PRN

## 2021-07-01 MED ORDER — PHENYLEPHRINE HCL-NACL 20-0.9 MG/250ML-% IV SOLN
INTRAVENOUS | Status: DC | PRN
  Administered 2021-07-01: 50 ug/min via INTRAVENOUS

## 2021-07-01 MED ORDER — FENTANYL CITRATE (PF) 100 MCG/2ML IJ SOLN
INTRAMUSCULAR | Status: AC
  Filled 2021-07-01: qty 2

## 2021-07-01 MED ORDER — POLYETHYLENE GLYCOL 3350 17 G PO PACK: 17.0000 g | PACK | Freq: Every day | ORAL | Status: DC | PRN

## 2021-07-01 MED ORDER — METHOCARBAMOL 500 MG IVPB - SIMPLE MED
INTRAVENOUS | Status: AC
  Filled 2021-07-01: qty 50

## 2021-07-01 MED ORDER — ALUM & MAG HYDROXIDE-SIMETH 200-200-20 MG/5ML PO SUSP: 30.0000 mL | ORAL | Status: DC | PRN

## 2021-07-01 MED ORDER — ESCITALOPRAM OXALATE 20 MG PO TABS
20.0000 mg | ORAL_TABLET | Freq: Every day | ORAL | Status: DC
  Administered 2021-07-02: 20 mg via ORAL
  Filled 2021-07-01: qty 1

## 2021-07-01 MED ORDER — METHOCARBAMOL 500 MG IVPB - SIMPLE MED
500.0000 mg | Freq: Four times a day (QID) | INTRAVENOUS | Status: DC | PRN
  Administered 2021-07-01: 500 mg via INTRAVENOUS
  Filled 2021-07-01: qty 50

## 2021-07-01 MED ORDER — WATER FOR IRRIGATION, STERILE IR SOLN
Status: DC | PRN
  Administered 2021-07-01: 2000 mL

## 2021-07-01 MED ORDER — PROPOFOL 10 MG/ML IV BOLUS
INTRAVENOUS | Status: AC
  Filled 2021-07-01: qty 20

## 2021-07-01 MED ORDER — 0.9 % SODIUM CHLORIDE (POUR BTL) OPTIME
TOPICAL | Status: DC | PRN
  Administered 2021-07-01: 1000 mL

## 2021-07-01 MED ORDER — LIDOCAINE HCL (PF) 1 % IJ SOLN
INTRAMUSCULAR | Status: DC | PRN
  Administered 2021-07-01: 9 mL

## 2021-07-01 MED ORDER — EPHEDRINE 5 MG/ML INJ
INTRAVENOUS | Status: AC
  Filled 2021-07-01: qty 5

## 2021-07-01 MED ORDER — DIPHENHYDRAMINE HCL 12.5 MG/5ML PO ELIX: 12.5000 mg | ORAL_SOLUTION | ORAL | Status: DC | PRN

## 2021-07-01 MED ORDER — ONDANSETRON HCL 4 MG/2ML IJ SOLN
INTRAMUSCULAR | Status: DC | PRN
  Administered 2021-07-01: 4 mg via INTRAVENOUS

## 2021-07-01 MED ORDER — PRAVASTATIN SODIUM 20 MG PO TABS
40.0000 mg | ORAL_TABLET | Freq: Every day | ORAL | Status: DC
  Administered 2021-07-01: 40 mg via ORAL
  Filled 2021-07-01: qty 2

## 2021-07-01 MED ORDER — ROCURONIUM BROMIDE 10 MG/ML (PF) SYRINGE
PREFILLED_SYRINGE | INTRAVENOUS | Status: DC | PRN
  Administered 2021-07-01: 50 mg via INTRAVENOUS

## 2021-07-01 MED ORDER — ONDANSETRON HCL 4 MG/2ML IJ SOLN: 4.0000 mg | Freq: Once | INTRAMUSCULAR | Status: DC | PRN

## 2021-07-01 MED ORDER — LOSARTAN POTASSIUM 50 MG PO TABS
50.0000 mg | ORAL_TABLET | Freq: Every day | ORAL | Status: DC
  Administered 2021-07-02: 50 mg via ORAL
  Filled 2021-07-01: qty 1

## 2021-07-01 MED ORDER — FENTANYL CITRATE PF 50 MCG/ML IJ SOSY: 25.0000 ug | PREFILLED_SYRINGE | INTRAMUSCULAR | Status: DC | PRN

## 2021-07-01 MED ORDER — FENTANYL CITRATE (PF) 100 MCG/2ML IJ SOLN
INTRAMUSCULAR | Status: DC | PRN
  Administered 2021-07-01: 12.5 ug via INTRAVENOUS

## 2021-07-01 MED ORDER — ACETAMINOPHEN 500 MG PO TABS
1000.0000 mg | ORAL_TABLET | Freq: Four times a day (QID) | ORAL | Status: AC
  Administered 2021-07-01 – 2021-07-02 (×4): 1000 mg via ORAL
  Filled 2021-07-01 (×4): qty 2

## 2021-07-01 MED ORDER — ONDANSETRON HCL 4 MG/2ML IJ SOLN
INTRAMUSCULAR | Status: AC
  Filled 2021-07-01: qty 2

## 2021-07-01 MED ORDER — CEFAZOLIN SODIUM-DEXTROSE 2-4 GM/100ML-% IV SOLN
INTRAVENOUS | Status: AC
  Filled 2021-07-01: qty 100

## 2021-07-01 MED ORDER — PROPOFOL 10 MG/ML IV BOLUS
INTRAVENOUS | Status: DC | PRN
  Administered 2021-07-01: 100 mg via INTRAVENOUS

## 2021-07-01 MED ORDER — ROCURONIUM BROMIDE 10 MG/ML (PF) SYRINGE
PREFILLED_SYRINGE | INTRAVENOUS | Status: AC
  Filled 2021-07-01: qty 10

## 2021-07-01 MED ORDER — CHLORHEXIDINE GLUCONATE 0.12 % MT SOLN
15.0000 mL | Freq: Once | OROMUCOSAL | Status: AC
  Administered 2021-07-01: 15 mL via OROMUCOSAL

## 2021-07-01 MED ORDER — ONDANSETRON HCL 4 MG PO TABS
4.0000 mg | ORAL_TABLET | Freq: Four times a day (QID) | ORAL | Status: DC | PRN
Start: 2021-07-01 — End: 2021-07-02

## 2021-07-01 MED ORDER — FLEET ENEMA 7-19 GM/118ML RE ENEM: 1.0000 | ENEMA | Freq: Once | RECTAL | Status: DC | PRN

## 2021-07-01 MED ORDER — ORAL CARE MOUTH RINSE: 15.0000 mL | Freq: Once | OROMUCOSAL | Status: AC

## 2021-07-01 MED ORDER — LIDOCAINE HCL (PF) 1 % IJ SOLN
INTRAMUSCULAR | Status: AC
  Filled 2021-07-01: qty 30

## 2021-07-01 MED ORDER — TRANEXAMIC ACID-NACL 1000-0.7 MG/100ML-% IV SOLN
1000.0000 mg | INTRAVENOUS | Status: AC
  Administered 2021-07-01: 1000 mg via INTRAVENOUS
  Filled 2021-07-01: qty 100

## 2021-07-01 MED ORDER — OXCARBAZEPINE 150 MG PO TABS
150.0000 mg | ORAL_TABLET | Freq: Two times a day (BID) | ORAL | Status: DC
  Administered 2021-07-02: 150 mg via ORAL
  Filled 2021-07-01 (×3): qty 1

## 2021-07-01 MED ORDER — BISACODYL 5 MG PO TBEC: 5.0000 mg | DELAYED_RELEASE_TABLET | Freq: Every day | ORAL | Status: DC | PRN

## 2021-07-01 MED ORDER — SUGAMMADEX SODIUM 200 MG/2ML IV SOLN
INTRAVENOUS | Status: DC | PRN
  Administered 2021-07-01: 200 mg via INTRAVENOUS

## 2021-07-01 MED ORDER — ZOLPIDEM TARTRATE 5 MG PO TABS: 5.0000 mg | ORAL_TABLET | Freq: Every evening | ORAL | Status: DC | PRN

## 2021-07-01 MED ORDER — DEXAMETHASONE SODIUM PHOSPHATE 10 MG/ML IJ SOLN
INTRAMUSCULAR | Status: DC | PRN
  Administered 2021-07-01: 4 mg via INTRAVENOUS

## 2021-07-01 MED ORDER — DOCUSATE SODIUM 100 MG PO CAPS
100.0000 mg | ORAL_CAPSULE | Freq: Two times a day (BID) | ORAL | Status: DC
  Filled 2021-07-01: qty 1

## 2021-07-01 MED ORDER — DEXAMETHASONE SODIUM PHOSPHATE 10 MG/ML IJ SOLN
INTRAMUSCULAR | Status: AC
  Filled 2021-07-01: qty 1

## 2021-07-01 MED ORDER — SODIUM CHLORIDE 0.9 % IR SOLN
Status: DC | PRN
  Administered 2021-07-01: 1000 mL

## 2021-07-01 SURGICAL SUPPLY — 84 items
AID PSTN UNV HD RSTRNT DISP (MISCELLANEOUS)
BAG COUNTER SPONGE SURGICOUNT (BAG) IMPLANT
BAG SPEC THK2 15X12 ZIP CLS (MISCELLANEOUS) ×2
BAG SPNG CNTER NS LX DISP (BAG)
BAG ZIPLOCK 12X15 (MISCELLANEOUS) ×3 IMPLANT
BASEPLATE GLENOSPHERE 25 STD (Miscellaneous) ×1 IMPLANT
BIT DRILL 1.6MX128 (BIT) ×2 IMPLANT
BIT DRILL 3.2 PERIPHERAL SCREW (BIT) ×2 IMPLANT
BLADE SAW SAG 73X25 THK (BLADE) ×1
BLADE SAW SGTL 73X25 THK (BLADE) ×2 IMPLANT
BODY PROXIMAL PTC 11 132.5D (Spacer) IMPLANT
BOOTIES KNEE HIGH SLOAN (MISCELLANEOUS) ×6 IMPLANT
BSPLAT GLND STD 25 RVRS SHLDR (Miscellaneous) ×2 IMPLANT
CAP LOCKING COCR (Cap) ×2 IMPLANT
COOLER ICEMAN CLASSIC (MISCELLANEOUS) IMPLANT
COVER BACK TABLE 60X90IN (DRAPES) ×3 IMPLANT
COVER SURGICAL LIGHT HANDLE (MISCELLANEOUS) ×3 IMPLANT
DRAPE INCISE IOBAN 66X45 STRL (DRAPES) ×3 IMPLANT
DRAPE ORTHO SPLIT 77X108 STRL (DRAPES) ×6
DRAPE POUCH INSTRU U-SHP 10X18 (DRAPES) ×3 IMPLANT
DRAPE SHEET LG 3/4 BI-LAMINATE (DRAPES) ×3 IMPLANT
DRAPE SURG 17X11 SM STRL (DRAPES) ×3 IMPLANT
DRAPE SURG ORHT 6 SPLT 77X108 (DRAPES) ×4 IMPLANT
DRAPE TOP 10253 STERILE (DRAPES) ×3 IMPLANT
DRAPE U-SHAPE 47X51 STRL (DRAPES) ×3 IMPLANT
DRSG AQUACEL AG ADV 3.5X 6 (GAUZE/BANDAGES/DRESSINGS) ×3 IMPLANT
DURAPREP 26ML APPLICATOR (WOUND CARE) ×6 IMPLANT
ELECT BLADE TIP CTD 4 INCH (ELECTRODE) ×3 IMPLANT
ELECT REM PT RETURN 15FT ADLT (MISCELLANEOUS) ×3 IMPLANT
GLENOSPHERE REV SHOULDER 36 (Joint) ×1 IMPLANT
GLOVE SRG 8 PF TXTR STRL LF DI (GLOVE) ×2 IMPLANT
GLOVE SURG ENC MOIS LTX SZ7.5 (GLOVE) ×3 IMPLANT
GLOVE SURG POLYISO LF SZ6.5 (GLOVE) ×3 IMPLANT
GLOVE SURG UNDER POLY LF SZ6.5 (GLOVE) ×3 IMPLANT
GLOVE SURG UNDER POLY LF SZ8 (GLOVE) ×3
GOWN STRL REUS W/TWL LRG LVL3 (GOWN DISPOSABLE) ×3 IMPLANT
GOWN STRL REUS W/TWL XL LVL3 (GOWN DISPOSABLE) ×3 IMPLANT
GUIDEWIRE GLENOID 2.5X220 (WIRE) ×1 IMPLANT
HANDPIECE INTERPULSE COAX TIP (DISPOSABLE) ×3
HOOD PEEL AWAY FLYTE STAYCOOL (MISCELLANEOUS) ×9 IMPLANT
IMPL REVERSE SHOULDER 0X3.5 (Shoulder) ×1 IMPLANT
IMPLANT REVERSE SHOULDER 0X3.5 (Shoulder) ×3 IMPLANT
INSERT HUMERAL 36X6MM 12.5DEG (Insert) ×1 IMPLANT
KIT BASIN OR (CUSTOM PROCEDURE TRAY) ×3 IMPLANT
MANIFOLD NEPTUNE II (INSTRUMENTS) ×3 IMPLANT
NDL TROCAR POINT SZ 2 1/2 (NEEDLE) IMPLANT
NEEDLE TROCAR POINT SZ 2 1/2 (NEEDLE) IMPLANT
NS IRRIG 1000ML POUR BTL (IV SOLUTION) ×3 IMPLANT
PACK SHOULDER (CUSTOM PROCEDURE TRAY) ×3 IMPLANT
PAD COLD SHLDR WRAP-ON (PAD) IMPLANT
PROTECTOR NERVE ULNAR (MISCELLANEOUS) IMPLANT
PROXIMAL BODY PTC 11 132.5D (Spacer) ×3 IMPLANT
RESTRAINT HEAD UNIVERSAL NS (MISCELLANEOUS) IMPLANT
RETRIEVER SUT HEWSON (MISCELLANEOUS) IMPLANT
SCREW 5.0X18 (Screw) ×2 IMPLANT
SCREW 5.5X14 (Screw) ×1 IMPLANT
SCREW 5.5X26 (Screw) ×2 IMPLANT
SCREW ASSEMBLY COCR TYPE 0 (Screw) ×2 IMPLANT
SCREW BONE INTRNL SM 7 (Screw) ×2 IMPLANT
SCREW PERIPHERAL 30 (Screw) ×1 IMPLANT
SET HNDPC FAN SPRY TIP SCT (DISPOSABLE) ×2 IMPLANT
SLING ARM FOAM STRAP MED (SOFTGOODS) ×2 IMPLANT
SLING ARM IMMOBILIZER LRG (SOFTGOODS) IMPLANT
SLING ARM IMMOBILIZER MED (SOFTGOODS) IMPLANT
SPONGE T-LAP 18X18 ~~LOC~~+RFID (SPONGE) ×3 IMPLANT
STEM PRTL DISTAL 11 SHOULDER (Miscellaneous) ×1 IMPLANT
STRIP CLOSURE SKIN 1/2X4 (GAUZE/BANDAGES/DRESSINGS) ×6 IMPLANT
SUCTION FRAZIER HANDLE 10FR (MISCELLANEOUS)
SUCTION TUBE FRAZIER 10FR DISP (MISCELLANEOUS) IMPLANT
SUPPORT WRAP ARM LG (MISCELLANEOUS) IMPLANT
SUT ETHIBOND 2 V 37 (SUTURE) IMPLANT
SUT FIBERWIRE #2 38 REV NDL BL (SUTURE)
SUT MNCRL AB 4-0 PS2 18 (SUTURE) ×3 IMPLANT
SUT VIC AB 2-0 CT1 27 (SUTURE) ×3
SUT VIC AB 2-0 CT1 TAPERPNT 27 (SUTURE) ×2 IMPLANT
SUTURE FIBERWR#2 38 REV NDL BL (SUTURE) IMPLANT
SUTURE TAPE 1.3 40 TPR END (SUTURE) ×6 IMPLANT
SUTURETAPE 1.3 40 TPR END (SUTURE) ×18
TAPE LABRALWHITE 1.5X36 (TAPE) IMPLANT
TAPE STRIPS DRAPE STRL (GAUZE/BANDAGES/DRESSINGS) ×1 IMPLANT
TAPE SUT LABRALTAP WHT/BLK (SUTURE) IMPLANT
TOWEL OR 17X26 10 PK STRL BLUE (TOWEL DISPOSABLE) ×3 IMPLANT
TOWEL OR NON WOVEN STRL DISP B (DISPOSABLE) ×3 IMPLANT
WATER STERILE IRR 1000ML POUR (IV SOLUTION) ×3 IMPLANT

## 2021-07-01 NOTE — Transfer of Care (Signed)
Immediate Anesthesia Transfer of Care Note  Patient: Terri Liu  Procedure(s) Performed: REVERSE SHOULDER ARTHROPLASTY (Right: Shoulder) SHOULDER INTRA-OP  INTRA -ART INJECTION (Left: Shoulder)  Patient Location: PACU  Anesthesia Type:GA combined with regional for post-op pain  Level of Consciousness: awake, alert , oriented and patient cooperative  Airway & Oxygen Therapy: Patient Spontanous Breathing and Patient connected to face mask oxygen  Post-op Assessment: Report given to RN and Post -op Vital signs reviewed and stable  Post vital signs: Reviewed and stable  Last Vitals:  Vitals Value Taken Time  BP 165/71 07/01/21 1357  Temp    Pulse 72 07/01/21 1400  Resp 25 07/01/21 1400  SpO2 100 % 07/01/21 1400  Vitals shown include unvalidated device data.  Last Pain:  Vitals:   07/01/21 1001  TempSrc: Oral  PainSc: 6       Patients Stated Pain Goal: 3 (36/85/99 2341)  Complications: No notable events documented.

## 2021-07-01 NOTE — Anesthesia Procedure Notes (Signed)

## 2021-07-01 NOTE — Op Note (Signed)
Procedure(s): REVERSE SHOULDER ARTHROPLASTY SHOULDER INTRA-OP  INTRA -ART INJECTION Procedure Note  Cerinity Zynda Rote female 87 y.o. 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 female  With right shoulder proximal humeral fracture nonunion which has gone on to be very painful and dysfunctional.  Indicated for surgical treatment with reverse total shoulder arthroplasty to try and decrease pain and restore function.  She also has associated end-stage osteoarthritis of the left shoulder and wished to have an intra-articular injection while she was under anesthesia     Surgeon: Rhae Hammock   Assistants: [place] was present and scrubbed throughout the procedure and was essential in positioning, retraction, exposure, and closure)  Anesthesia: General endotracheal anesthesia with preoperative interscalene block given by the attending anesthesiologist    Procedure Detail  Evan, SHOULDER INTRA-OP  INTRA -ART INJECTION   Estimated Blood Loss:  200 mL         Drains: none  Blood Given: none          Specimens: none        Complications:  * No complications entered in OR log *         Disposition: PACU - hemodynamically stable.         Condition: stable      OPERATIVE FINDINGS:  A Tornier revive stem with a size 36 glenosphere was placed after resecting the nonunion but maintaining the tuberosities for tuberosity and rotator cuff repair.  PROCEDURE: The patient was identified in the preoperative holding area  where I personally marked the operative site after verifying site, side,  and procedure with the patient. An interscalene block given by  the attending  anesthesiologist in the holding area and the patient was taken back to the operating room where all extremities were  carefully padded in position after general anesthesia was induced. She  was placed in a beach-chair position and the operative upper extremity was  prepped and draped in a standard sterile fashion. An approximately 10-  cm incision was made from the tip of the coracoid process to the center  point of the humerus at the level of the axilla. Dissection was carried  down through subcutaneous tissues to the level of the cephalic vein  which was taken laterally with the deltoid. The pectoralis major was  retracted medially. The subdeltoid space was developed and the lateral  edge of the conjoined tendon was identified. The undersurface of  conjoined tendon was palpated and the musculocutaneous nerve was not in  the field. Retractor was placed underneath the conjoined and second  retractor was placed lateral into the deltoid.  The biceps tendon was identified and traced into the rotator interval.  This was used as a landmark to then osteotomize the lesser tuberosity.  The fracture nonunion site was carefully mobilized using a small Cobb.  The greater tuberosity was maintained in continuity with the supraspinatus and infraspinatus.  The articular head fragment was removed.  At this point the glenoid was exposed and prepared for the glenosphere.  A 36 glenosphere was placed in neutral angulation.  At this point the proximal humeral shaft was again exposed and sized for the implant.  The appropriate sized implant trial was placed and the  joint was reduced with satisfactory tension and height.  The final implant was then placed press-fit.  The tuberosities were then carefully repaired around the implant using 6 suture tapes in a standard configuration including vertical tension band.  Small amount of bone graft was placed inferiorly in the construct.  The joint was felt to be stable and moving  smoothly.  Copious irrigation was used.  Skin was closed with 2-0 Vicryl in a deep dermal layer and 4-0  Monocryl for skin closure. Steri-Strips were applied. Sterile  dressings were then applied as well as a sling.  At this point attention was turned to the left shoulder where the anterior shoulder was prepped with alcohol and a 22-gauge needle was used to enter the joint just lateral to the palpable coracoid and a combination of 1 cc 40 mg Kenalog and 7 cc 1% lidocaine without epinephrine was advanced without resistance.  The needle was withdrawn and a small sterile Band-Aid was applied.   The patient was allowed  to awaken from general anesthesia, transferred to stretcher, and taken  to recovery room in stable condition.   POSTOPERATIVE PLAN: The patient will be kept in the hospital postoperatively  for pain control and therapy.  She will likely be discharged home in the morning with family.

## 2021-07-01 NOTE — Plan of Care (Signed)
  Problem: Activity: Goal: Ability to tolerate increased activity will improve Outcome: Progressing   Problem: Pain Management: Goal: Pain level will decrease with appropriate interventions Outcome: Progressing   Problem: Education: Goal: Knowledge of General Education information will improve Description: Including pain rating scale, medication(s)/side effects and non-pharmacologic comfort measures Outcome: Progressing   Problem: Nutrition: Goal: Adequate nutrition will be maintained Outcome: Progressing

## 2021-07-01 NOTE — Discharge Instructions (Addendum)

## 2021-07-01 NOTE — Anesthesia Postprocedure Evaluation (Signed)
Anesthesia Post Note  Patient: Terri Liu  Procedure(s) Performed: REVERSE SHOULDER ARTHROPLASTY (Right: Shoulder) SHOULDER INTRA-OP  INTRA -ART INJECTION (Left: Shoulder)     Patient location during evaluation: PACU Anesthesia Type: General Level of consciousness: awake and alert and oriented Pain management: pain level controlled Vital Signs Assessment: post-procedure vital signs reviewed and stable Respiratory status: spontaneous breathing, nonlabored ventilation and respiratory function stable Cardiovascular status: blood pressure returned to baseline and stable Postop Assessment: no apparent nausea or vomiting Anesthetic complications: no   No notable events documented.  Last Vitals:  Vitals:   07/01/21 1430 07/01/21 1445  BP:    Pulse: 66 69  Resp: (!) 21 20  Temp:    SpO2: 99% 96%    Last Pain:  Vitals:   07/01/21 1430  TempSrc:   PainSc: 2                  Keino Placencia A.

## 2021-07-01 NOTE — Anesthesia Preprocedure Evaluation (Signed)
Anesthesia Evaluation  Patient identified by MRN, date of birth, ID band Patient awake    Reviewed: Allergy & Precautions, NPO status , Patient's Chart, lab work & pertinent test results, reviewed documented beta blocker date and time   Airway Mallampati: II  TM Distance: >3 FB Neck ROM: Full    Dental  (+) Edentulous Upper, Edentulous Lower   Pulmonary pneumonia, resolved,  Covid 95- 09/14/20 resolved, had pneumonia at that time   Pulmonary exam normal breath sounds clear to auscultation       Cardiovascular hypertension, Pt. on medications and Pt. on home beta blockers +CHF  Normal cardiovascular exam Rhythm:Regular Rate:Normal  EKG 03/16/21 NSR, LBBB pattern  Echo 12/19/20 1. Left ventricular ejection fraction, by estimation, is 40 to 45%. The left ventricle has mildly decreased function. Abnormal (paradoxical) septal motion, consistent with left bundle branch block. There is moderate left ventricular hypertrophy. Left ventricular diastolic parameters are consistent with Grade I diastolic dysfunction (impaired relaxation).  2. Right ventricular systolic function is normal. The right ventricular size is normal. Tricuspid regurgitation signal is inadequate for assessing PA pressure.  3. The mitral valve is normal in structure. Mild mitral valve  regurgitation.  4. The aortic valve was not well visualized. Aortic valve regurgitation is mild. Mild aortic valve stenosis. 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  Neuromuscular disease    GI/Hepatic negative GI ROS, Neg liver ROS,   Endo/Other  Hyperlipidemia  Renal/GU negative Renal ROS Bladder dysfunction  Neurogenic bladder    Musculoskeletal  (+) Arthritis , Osteoarthritis,  Malunion right proximal  humerus Fx   Abdominal   Peds  Hematology negative hematology ROS (+)   Anesthesia Other Findings   Reproductive/Obstetrics                             Anesthesia Physical Anesthesia Plan  ASA: 3  Anesthesia Plan: General   Post-op Pain Management:  Regional for Post-op pain   Induction: Intravenous  PONV Risk Score and Plan: 4 or greater and Treatment may vary due to age or medical condition and Ondansetron  Airway Management Planned: Oral ETT  Additional Equipment:   Intra-op Plan:   Post-operative Plan: Extubation in OR  Informed Consent: I have reviewed the patients History and Physical, chart, labs and discussed the procedure including the risks, benefits and alternatives for the proposed anesthesia with the patient or authorized representative who has indicated his/her understanding and acceptance.     Dental advisory given  Plan Discussed with: CRNA and Anesthesiologist  Anesthesia Plan Comments:         Anesthesia Quick Evaluation

## 2021-07-01 NOTE — H&P (Signed)
Terri Liu is an 87 y.o. female.   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  Past Medical History:  Diagnosis Date   Arthritis    Diarrhea    "sometimes ., due to Gallbladder removed"   High cholesterol    History of shingles    effecrt nerve sees neurologist in [facility]   Hypertension    Post herpetic neuralgia 08/15/2016   Right V1   Shingles     Past Surgical History:  Procedure Laterality Date   ABDOMINAL HYSTERECTOMY     COMPLETE   CARPAL TUNNEL RELEASE Bilateral    CHOLECYSTECTOMY     EYE SURGERY     Cornea   FEMUR IM NAIL Left 01/12/2017   Procedure: INTRAMEDULLARY (IM) NAIL FEMORAL LEFT;  Surgeon: Rod Can, MD;  Location: [facility];  Service: Orthopedics;  Laterality: Left;   GAS INSERTION Right 01/27/2015   Procedure: INSERTION OF GAS;  Surgeon: Hayden Pedro, MD;  Location: [place];  Service: Ophthalmology;  Laterality: Right;  C3F8   JOINT REPLACEMENT Right 4 YRS AGO   KNEE   KNEE ARTHROSCOPY Bilateral    MEMBRANE PEEL Right 01/27/2015   Procedure: MEMBRANE PEEL;  Surgeon: Hayden Pedro, MD;  Location: [place];  Service: Ophthalmology;  Laterality: Right;   PERFLUORONE INJECTION Right 01/27/2015   Procedure: PERFLUORONE INJECTION;  Surgeon: Hayden Pedro, MD;  Location: [place];  Service: Ophthalmology;  Laterality: Right;   PHOTOCOAGULATION WITH LASER Right 01/27/2015   Procedure: PHOTOCOAGULATION WITH LASER;  Surgeon: Hayden Pedro, MD;  Location: [place];  Service: Ophthalmology;  Laterality: Right;  ENDOLASER   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   Procedure: REPAIR OF COMPLEX TRACTION RETINAL DETACHMENT;  Surgeon: Hayden Pedro, MD;  Location: [place];  Service: Ophthalmology;  Laterality: Right;   TOTAL KNEE ARTHROPLASTY Left 12/17/2015   Procedure: LEFT TOTAL KNEE ARTHROPLASTY;  Surgeon: Susa Day, MD;   Location: [facility];  Service: Orthopedics;  Laterality: Left;   VITRECTOMY 25 GAUGE WITH SCLERAL BUCKLE Right 01/27/2015   Procedure: VITRECTOMY 25 GAUGE WITH SCLERAL BUCKLE;  Surgeon: Hayden Pedro, MD;  Location: [place];  Service: Ophthalmology;  Laterality: Right;    Family History  Problem Relation Age of Onset   Heart failure Mother    Cancer - Prostate Father    Heart disease Brother    Cancer Sister    Cancer Brother    Social History:  reports that she has never smoked. She has never used smokeless tobacco. She reports that she does not drink alcohol and does not use drugs.  Allergies:  Allergies  Allergen Reactions   Cymbalta [Duloxetine Hcl] Other (See Comments)    Made her very sick (flu-like symptoms)   Lipitor [Atorvastatin] Other (See Comments)    Muscle pain in legs   Other Other (See Comments)    Other reaction(s): OPIOID - analgesic unknown reaction   Amlodipine Besylate-Valsartan Rash   Codeine Rash   Hydrocodone-Acetaminophen Rash   Tape Other (See Comments)    Pulled skin off. Paper tape is ok.     Medications Prior to Admission  Medication Sig Dispense Refill   acetaminophen (TYLENOL) 500 MG tablet Take 1,000 mg by mouth every 6 (six) hours as needed for mild pain.     Biotin 5000 MCG TABS Take 5,000 mcg by mouth daily.  Carboxymethylcellulose Sodium (ARTIFICIAL TEARS OP) Place 1 drop into both eyes daily as needed (dry eyes).     Cholecalciferol (VITAMIN D) 50 MCG (2000 UT) CAPS Take 2,000 Units by mouth daily.     escitalopram (LEXAPRO) 20 MG tablet Take 20 mg by mouth daily.     fluticasone (FLONASE) 50 MCG/ACT nasal spray Place 1 spray into both nostrils daily as needed for allergies or rhinitis.     losartan (COZAAR) 50 MG tablet Take 50 mg by mouth daily.     lovastatin (MEVACOR) 40 MG tablet Take 40 mg by mouth daily.  4   methocarbamol (ROBAXIN) 500 MG tablet Take 2 tablets (1,000 mg total) by mouth every 8 (eight) hours.     metoprolol tartrate  (LOPRESSOR) 25 MG tablet Take 0.5 tablets (12.5 mg total) by mouth 2 (two) times daily. (Patient taking differently: Take 25 mg by mouth 2 (two) times daily.)     OXcarbazepine (TRILEPTAL) 150 MG tablet Take 1 tablet (150 mg total) by mouth 2 (two) times daily. 20 tablet 0   pantoprazole (PROTONIX) 40 MG tablet Take 1 tablet (40 mg total) by mouth daily. (Patient taking differently: Take 40 mg by mouth daily as needed (ACID REFLUX).)     TART CHERRY PO Take 460 mg by mouth daily.     traMADol (ULTRAM) 50 MG tablet Take 50 mg by mouth in the morning and at bedtime.     vitamin B-12 (CYANOCOBALAMIN) 1000 MCG tablet Take 1,000 mcg by mouth daily.     vitamin C (ASCORBIC ACID) 500 MG tablet Take 500 mg by mouth daily.     aspirin 81 MG EC tablet Take 1 tablet (81 mg total) by mouth daily. (Patient not taking: Reported on 06/21/2021)     escitalopram (LEXAPRO) 10 MG tablet Take 1 tablet (10 mg total) by mouth daily. (Patient not taking: No sig reported) 20 tablet 0   losartan (COZAAR) 25 MG tablet Take 1 tablet (25 mg total) by mouth daily. (Patient not taking: No sig reported)     oxyCODONE (ROXICODONE) 5 MG/5ML solution Take 5-10 mLs (5-10 mg total) by mouth every 3 (three) hours as needed for moderate pain or severe pain (5mg  for moderate pain, 10mg  for severe pain). (Patient not taking: Reported on 06/21/2021) 473 mL 0   polyethylene glycol (MIRALAX / GLYCOLAX) 17 g packet Take 17 g by mouth daily. (Patient not taking: Reported on 06/21/2021) 14 each 0   senna-docusate (SENOKOT-S) 8.6-50 MG tablet Take 1 tablet by mouth at bedtime. (Patient not taking: Reported on 06/21/2021)      No results found for this or any previous visit (from the past 48 hour(s)). No results found.  Review of Systems  All other systems reviewed and are negative.  Blood pressure (!) 182/97, pulse 65, resp. rate 18, height 5\' 3"  (1.6 m), weight 69.9 kg, SpO2 99 %. Physical Exam HENT:     Head: Atraumatic.  Eyes:      Extraocular Movements: Extraocular movements intact.  Cardiovascular:     Pulses: Normal pulses.  Pulmonary:     Effort: Pulmonary effort is normal.  Musculoskeletal:     Comments: R shoulder pain with limited ROM. NVID  Skin:    General: Skin is warm.  Neurological:     Mental Status: She is alert.  Psychiatric:        Mood and Affect: Mood normal.     Assessment/Plan Right shoulder painful proximal humeral nonunion Plan right shoulder reverse  total shoulder arthroplasty Risks / benefits of surgery discussed Consent on chart  NPO for OR Preop antibiotics   Rhae Hammock, MD 07/01/2021, 11:04 AM

## 2021-07-01 NOTE — Anesthesia Procedure Notes (Signed)
Anesthesia Regional Block: Interscalene brachial plexus block   Pre-Anesthetic Checklist: , timeout performed,  Correct Patient, Correct Site, Correct Laterality,  Correct Procedure, Correct Position, site marked,  Risks and benefits discussed,  Surgical consent,  Pre-op evaluation,  At surgeon's request and post-op pain management  Laterality: Right  Prep: chloraprep       Needles:  Injection technique: Single-shot  Needle Type: Echogenic Stimulator Needle     Needle Length: 10cm  Needle Gauge: 21   Needle insertion depth: 6 cm   Additional Needles:   Procedures:,,,, ultrasound used (permanent image in chart),,    Narrative:  Start time: 07/01/2021 11:20 AM End time: 07/01/2021 11:25 AM Injection made incrementally with aspirations every 5 mL.  Performed by: Personally  Anesthesiologist: Josephine Igo, MD  Additional Notes: Timeout performed. Patient sedated. Relevant anatomy ID'd using [place]. Incremental 2-19ml injection of LA with frequent aspiration. Patient tolerated procedure well.    Right Interscalene Blcok

## 2021-07-01 NOTE — Progress Notes (Signed)
AssistedDr. Royce Macadamia with right, ultrasound guided, interscalene  block. Side rails up, monitors on throughout procedure. See vital signs in flow sheet. Tolerated Procedure well.

## 2021-07-02 DIAGNOSIS — M19012 Primary osteoarthritis, left shoulder: Secondary | ICD-10-CM | POA: Diagnosis not present

## 2021-07-02 LAB — BASIC METABOLIC PANEL
Anion gap: 8 (ref 5–15)
BUN: 10 mg/dL (ref 8–23)
CO2: 26 mmol/L (ref 22–32)
Calcium: 8.8 mg/dL — ABNORMAL LOW (ref 8.9–10.3)
Chloride: 97 mmol/L — ABNORMAL LOW (ref 98–111)
Creatinine, Ser: 0.36 mg/dL — ABNORMAL LOW (ref 0.44–1.00)
GFR, Estimated: >60 mL/min (ref >60)
Glucose, Bld: 176 mg/dL — ABNORMAL HIGH (ref 70–99)
Potassium: 4.6 mmol/L (ref 3.5–5.1)
Sodium: 131 mmol/L — ABNORMAL LOW (ref 135–145)

## 2021-07-02 LAB — CBC
HCT: 37.6 % (ref 36.0–46.0)
Hemoglobin: 12.8 g/dL (ref 12.0–15.0)
MCH: 32.7 pg (ref 26.0–34.0)
MCHC: 34 g/dL (ref 30.0–36.0)
MCV: 96.2 fL (ref 80.0–100.0)
Platelets: 210 10*3/uL (ref 150–400)
RBC: 3.91 MIL/uL (ref 3.87–5.11)
RDW: 12.5 % (ref 11.5–15.5)
WBC: 8.9 10*3/uL (ref 4.0–10.5)
nRBC: 0 % (ref 0.0–0.2)

## 2021-07-02 MED ORDER — TRAMADOL HCL 50 MG PO TABS: 50.0000 mg | ORAL_TABLET | Freq: Four times a day (QID) | ORAL | 0 refills | Status: AC | PRN

## 2021-07-02 MED ORDER — TRAMADOL HCL 50 MG PO TABS: 50.0000 mg | ORAL_TABLET | Freq: Four times a day (QID) | ORAL | 0 refills | Status: DC | PRN

## 2021-07-02 NOTE — Evaluation (Signed)
Occupational Therapy Evaluation Patient Details Name: Terri Liu MRN: [id number] DOB: December 03, 1933 Today's Date: 07/02/2021   History of Present Illness 87 y.o. female s/p  R reverse total shoulder arthroplasty 10/20. PMH HTN, arthritis, knee replacements BLEs, plevis fx from fall in July 2022.   Clinical Impression   PTA, pt was living at home alone and independent in ADLs/IADLs with the use of a single point cane. Pt s/p shoulder replacement without functional use of right dominant upper extremity secondary to effects of surgery and interscalene block and shoulder precautions. Therapist provided education and instruction to patient in regards to exercises, precautions, positioning, donning upper extremity clothing and bathing while maintaining shoulder precautions, ice and edema management and donning/doffing sling. Patient verbalized understanding and demonstrated as needed. Patient needed assistance to donn underwear and robe and provided with instruction on compensatory strategies to perform ADLs. Patient to follow up with MD for further therapy needs.       Recommendations for follow up therapy are one component of a multi-disciplinary discharge planning process, led by the attending physician.  Recommendations may be updated based on patient status, additional functional criteria and insurance authorization.   Follow Up Recommendations  No OT follow up;Supervision - Intermittent;Follow surgeon's recommendation for DC plan and follow-up therapies    Equipment Recommendations  None recommended by OT    Recommendations for Other Services       Precautions / Restrictions Precautions Precautions: Shoulder Type of Shoulder Precautions: Per MD orders "Frequency Information   Routine: OT consult   Start tomorrow: Patient is not ready for OT consult today and should be ready to begin tomorrow.   Imminent Discharge: Patient needs OT consult as soon as possible. Discharge is pending OT  consult; anticipated discharge within 24 hours.       Occupational Therapists may utilize "Acute Rehab Therapy Plan of Care Orders Protocol"." Shoulder Interventions: Shoulder sling/immobilizer;Off for dressing/bathing/exercises Precaution Booklet Issued: Yes (comment) Required Braces or Orthoses: Sling Restrictions Weight Bearing Restrictions: Yes RUE Weight Bearing: Non weight bearing      Mobility Bed Mobility               General bed mobility comments: OOB upon arrival in recliner    Transfers Overall transfer level: Needs assistance   Transfers: Sit to/from Stand Sit to Stand: Supervision         General transfer comment: pt supervision level for safety    Balance Overall balance assessment: No apparent balance deficits (not formally assessed) (Uses single point cane for house distance mobility.)                                         ADL either performed or assessed with clinical judgement   ADL Overall ADL's : Needs assistance/impaired                                             Vision Patient Visual Report: No change from baseline       Perception     Praxis      Pertinent Vitals/Pain Pain Assessment: No/denies pain     Hand Dominance Right   Extremity/Trunk Assessment Upper Extremity Assessment Upper Extremity Assessment: RUE deficits/detail RUE Deficits / Details: numbness s/p nerve block and surgery  Cervical / Trunk Assessment Cervical / Trunk Assessment: Normal   Communication Communication Communication: No difficulties   Cognition Arousal/Alertness: Awake/alert Behavior During Therapy: WFL for tasks assessed/performed Overall Cognitive Status: Within Functional Limits for tasks assessed                                     General Comments       Exercises Exercises: Shoulder   Shoulder Instructions Shoulder Instructions Donning/doffing shirt without moving  shoulder: Minimal assistance;Patient able to independently direct caregiver Method for sponge bathing under operated UE: Supervision/safety;Patient able to independently direct caregiver Donning/doffing sling/immobilizer: Supervision/safety;Patient able to independently direct caregiver Correct positioning of sling/immobilizer: Patient able to independently direct caregiver;Minimal assistance ROM for elbow, wrist and digits of operated UE: Independent Sling wearing schedule (on at all times/off for ADL's): Independent Proper positioning of operated UE when showering: Independent Positioning of UE while sleeping: Independent;Patient able to independently direct caregiver    Home Living Family/patient expects to be discharged to:: Private residence Living Arrangements: Alone Available Help at Discharge: Family Type of Home: House Home Access: Stairs to enter Technical brewer of Steps: 4 Entrance Stairs-Rails: Can reach both [place]: One level     Bathroom Shower/Tub: Donna: Clinical cytogeneticist - 4 wheels;Cane - single point          Prior Functioning/Environment Level of Independence: Independent with assistive device(s)        Comments: Used single point cane for mobility.        OT Problem List: Impaired UE functional use;Decreased range of motion;Decreased strength;Decreased knowledge of precautions;Decreased safety awareness      OT Treatment/Interventions:      OT Goals(Current goals can be found in the care plan section)    OT Frequency:     Barriers to D/C:            Co-evaluation              AM-PAC OT "6 Clicks" Daily Activity     Outcome Measure Help from another person eating meals?: None Help from another person taking care of personal grooming?: A Little Help from another person toileting, which includes using toliet, bedpan, or urinal?: A Little Help from another person bathing (including washing,  rinsing, drying)?: A Little Help from another person to put on and taking off regular upper body clothing?: A Little Help from another person to put on and taking off regular lower body clothing?: A Little 6 Click Score: 19   End of Session Equipment Utilized During Treatment: Other (comment) (sling) Nurse Communication: Mobility status  Activity Tolerance: Patient tolerated treatment well Patient left: in chair;with chair alarm set  OT Visit Diagnosis: History of falling (Z91.81)                Time: 6759-1638 OT Time Calculation (min): 35 min Charges:  OT General Charges $OT Visit: 1 Visit OT Evaluation $OT Eval Low Complexity: 1 Low OT Treatments $Self Care/Home Management : 8-22 mins  Jackquline Denmark, OTS Acute Rehab Office: [phone]   Roscoe Witts 07/02/2021, 11:02 AM

## 2021-07-02 NOTE — Progress Notes (Signed)
PATIENT ID: Terri Liu  MRN: [id number]  DOB/AGE:  October 31, 1933 / 87 y.o.  1 Day Post-Op Procedure(s) (LRB): REVERSE SHOULDER ARTHROPLASTY (Right) SHOULDER INTRA-OP  INTRA -ART INJECTION (Left)  Subjective: Pain is mild.  Reports that Tylenol is managing her pain well. No c/o chest pain or SOB.  Voiding well. Positive flatus. Sitting comfortably in room.   Objective: Vital signs in last 24 hours: Temp:  [97.4 F (36.3 C)-98.4 F (36.9 C)] 98.4 F (36.9 C) (10/21 0513) Pulse Rate:  [65-83] 76 (10/21 0513) Resp:  [16-21] 17 (10/21 0513) BP: (113-182)/(50-97) 153/69 (10/21 0513) SpO2:  [86 %-100 %] 99 % (10/21 0513) Weight:  [69.9 kg] 69.9 kg (10/20 1001)  Intake/Output from previous day: 10/20 0701 - 10/21 0700 In: 3000 [P.O.:780; I.V.:1970; IV Piggyback:250] Out: 1150 [Urine:800; Blood:350]   Recent Labs    07/02/21 0315  HGB 12.8   Recent Labs    07/02/21 0315  WBC 8.9  RBC 3.91  HCT 37.6  PLT 210   Recent Labs    07/02/21 0315  NA 131*  K 4.6  CL 97*  CO2 26  BUN 10  CREATININE 0.36*  GLUCOSE 176*  CALCIUM 8.8*     Physical Exam: Neurologically intact Intact pulses distally Incision: dressing C/D/I No cellulitis present Patient able to move fingers and wrist but sensation to touch still limited Grip strength reduced still from block  Assessment/Plan: 1 Day Post-Op Procedure(s) (LRB): REVERSE SHOULDER ARTHROPLASTY (Right) SHOULDER INTRA-OP  INTRA -ART INJECTION (Left)   Advance diet Up with therapy D/C IV fluids Non Weight Bearing (NWB) right UE VTE prophylaxis:  aspirin 81mg  daily   Plan for DC home today after OT. Will DC IV fluids this morning as she is taking POs well. Follow up in office in 2 weeks for xrays and recheck. Discharge instructions given.   Terri Hagenow L. [place], PA-C 07/02/2021, 8:00 AM

## 2021-07-02 NOTE — Progress Notes (Signed)
Provided discharge education/instructions, all questions and concerns addressed, Pt not in acute distress, to discharge home with belongings accompanied by family.

## 2021-07-02 NOTE — Discharge Summary (Signed)
Patient ID: Terri Liu MRN: [id number] DOB/AGE: 85/28/1935 85 y.o.  Admit date: 07/01/2021 Discharge date: 07/02/2021  Admission Diagnoses:  Active Problems:   S/P reverse total shoulder arthroplasty, right   Discharge Diagnoses:  Same  Past Medical History:  Diagnosis Date   Arthritis    Diarrhea    "sometimes ., due to Gallbladder removed"   High cholesterol    History of shingles    effecrt nerve sees neurologist in [facility]   Hypertension    Post herpetic neuralgia 08/15/2016   Right V1   Shingles     Surgeries: Procedure(s): REVERSE SHOULDER ARTHROPLASTY SHOULDER INTRA-OP  INTRA -ART INJECTION on 07/01/2021   Discharged Condition: Improved  Hospital Course: Terri Liu is an 87 y.o. 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 Patient has severe unremitting pain that affects sleep, daily activities, and work/hobbies. After pre-op clearance the patient was taken to the operating room on 07/01/2021 and underwent  Procedure(s): REVERSE SHOULDER ARTHROPLASTY SHOULDER INTRA-OP  INTRA -ART INJECTION.    Patient was given perioperative antibiotics:  Anti-infectives (From admission, onward)    Start     Dose/Rate Route Frequency Ordered Stop   07/01/21 1204  ceFAZolin (ANCEF) 2-4 GM/100ML-% IVPB       Note to Pharmacy: Randa Evens  : cabinet override      07/01/21 1204 07/01/21 1212   07/01/21 1000  ceFAZolin (ANCEF) IVPB 2g/100 mL premix        2 g 200 mL/hr over 30 Minutes Intravenous On call to O.R. 07/01/21 0951 07/01/21 1235        Patient was given sequential compression devices, early ambulation, and chemoprophylaxis to prevent DVT.  Patient benefited maximally from hospital stay and there were no complications.    Recent vital signs: Patient Vitals for the past 24 hrs:  BP Temp Temp src Pulse Resp SpO2 Height Weight  07/02/21 0513 (!) 153/69 98.4 F  (36.9 C) -- 76 17 99 % -- --  07/02/21 0120 127/71 97.8 F (36.6 C) -- 66 17 97 % -- --  07/01/21 2057 (!) 149/65 97.9 F (36.6 C) Oral 83 17 95 % -- --  07/01/21 1744 (!) 117/50 97.7 F (36.5 C) Oral 75 19 97 % -- --  07/01/21 1536 132/65 (!) 97.4 F (36.3 C) Oral 78 20 98 % -- --  07/01/21 1445 -- -- -- 69 20 96 % -- --  07/01/21 1430 -- -- -- 66 (!) 21 99 % -- --  07/01/21 1415 (!) 138/59 -- -- 71 20 (!) 86 % -- --  07/01/21 1400 113/89 -- -- 72 20 100 % -- --  07/01/21 1357 (!) 165/71 98.1 F (36.7 C) -- 72 16 100 % -- --  07/01/21 1002 (!) 182/97 -- -- -- -- -- -- --  07/01/21 1001 -- -- Oral 65 18 99 % 5\' 3"  (1.6 m) 69.9 kg     Recent laboratory studies:  Recent Labs    07/02/21 0315  WBC 8.9  HGB 12.8  HCT 37.6  PLT 210  NA 131*  K 4.6  CL 97*  CO2 26  BUN 10  CREATININE 0.36*  GLUCOSE 176*  CALCIUM 8.8*     Discharge Medications:   Allergies as of 07/02/2021       Reactions   Cymbalta [duloxetine Hcl] Other (See Comments)   Made her very sick (flu-like symptoms)   Lipitor [atorvastatin] Other (  See Comments)   Muscle pain in legs   Other Other (See Comments)   Other reaction(s): OPIOID - analgesic unknown reaction   Amlodipine Besylate-valsartan Rash   Codeine Rash   Hydrocodone-acetaminophen Rash   Tape Other (See Comments)   Pulled skin off. Paper tape is ok.         Medication List     STOP taking these medications    oxyCODONE 5 MG/5ML solution Commonly known as: ROXICODONE   polyethylene glycol 17 g packet Commonly known as: MIRALAX / GLYCOLAX   senna-docusate 8.6-50 MG tablet Commonly known as: Senokot-S       TAKE these medications    acetaminophen 500 MG tablet Commonly known as: TYLENOL Take 1,000 mg by mouth every 6 (six) hours as needed for mild pain.   ARTIFICIAL TEARS OP Place 1 drop into both eyes daily as needed (dry eyes).   aspirin 81 MG EC tablet Take 1 tablet (81 mg total) by mouth daily.   Biotin 5000  MCG Tabs Take 5,000 mcg by mouth daily.   escitalopram 20 MG tablet Commonly known as: LEXAPRO Take 20 mg by mouth daily. What changed: Another medication with the same name was removed. Continue taking this medication, and follow the directions you see here.   fluticasone 50 MCG/ACT nasal spray Commonly known as: FLONASE Place 1 spray into both nostrils daily as needed for allergies or rhinitis.   losartan 50 MG tablet Commonly known as: COZAAR Take 50 mg by mouth daily. What changed: Another medication with the same name was removed. Continue taking this medication, and follow the directions you see here.   lovastatin 40 MG tablet Commonly known as: MEVACOR Take 40 mg by mouth daily.   methocarbamol 500 MG tablet Commonly known as: ROBAXIN Take 2 tablets (1,000 mg total) by mouth every 8 (eight) hours.   metoprolol tartrate 25 MG tablet Commonly known as: LOPRESSOR Take 0.5 tablets (12.5 mg total) by mouth 2 (two) times daily. What changed: how much to take   OXcarbazepine 150 MG tablet Commonly known as: TRILEPTAL Take 1 tablet (150 mg total) by mouth 2 (two) times daily.   pantoprazole 40 MG tablet Commonly known as: PROTONIX Take 1 tablet (40 mg total) by mouth daily. What changed:  when to take this reasons to take this   TART CHERRY PO Take 460 mg by mouth daily.   traMADol 50 MG tablet Commonly known as: ULTRAM Take 1 tablet (50 mg total) by mouth every 6 (six) hours as needed. What changed:  when to take this reasons to take this   vitamin B-12 1000 MCG tablet Commonly known as: CYANOCOBALAMIN Take 1,000 mcg by mouth daily.   vitamin C 500 MG tablet Commonly known as: ASCORBIC ACID Take 500 mg by mouth daily.   Vitamin D 50 MCG (2000 UT) Caps Take 2,000 Units by mouth daily.        Diagnostic Studies: DG Chest 2 View  Result Date: 06/25/2021 CLINICAL DATA:  87 year old female with preoperative chest x-ray EXAM: CHEST - 2 VIEW COMPARISON:   03/16/2021 FINDINGS: Cardiomediastinal silhouette unchanged. No interlobular septal thickening. Low lung volumes with asymmetric elevation of the right hemidiaphragm, unchanged. No pleural effusion pneumothorax or new confluent airspace disease. Scoliotic curvature of the spine with associated degenerative changes. IMPRESSION: Negative for acute cardiopulmonary disease Electronically Signed   By: Corrie Mckusick D.O.   On: 06/25/2021 08:51   CT SHOULDER RIGHT WO CONTRAST  Result Date: 06/28/2021 CLINICAL DATA:  Preop for total shoulder arthroplasty. History of a fall in July 2022 with complex fracture. EXAM: CT OF THE UPPER RIGHT EXTREMITY WITHOUT CONTRAST TECHNIQUE: Multidetector CT imaging of the upper right extremity was performed according to the standard protocol. COMPARISON:  Radiograph 03/16/2021 FINDINGS: Ununited impacted femoral neck fracture with smooth corticated margins around the fracture site. Attempted callus formation mainly inferiorly. Moderate to advanced glenohumeral joint degenerative changes with cartilage loss, joint space narrowing, spurring and subchondral cystic change. Unfortunately, the [facility] joint is not included. Grossly the rotator cuff tendons appear to be intact but these are not completely visualized hilar. The right ribs are intact. No acute fractures. The right lung is grossly clear. IMPRESSION: 1. The entire shoulder was not imaged. The images begin just below the [facility] joint. The rotator cuff tendons are not completely imaged. This study can be repeated if it is not adequate for preoperative planning. 2. Remote ununited humeral head/neck fracture. 3. Moderate to advanced glenohumeral joint degenerative changes. Electronically Signed   By: Marijo Sanes M.D.   On: 06/28/2021 15:46   DG Shoulder Right Port  Result Date: 07/01/2021 CLINICAL DATA:  Status post right shoulder arthroplasty. EXAM: PORTABLE RIGHT SHOULDER COMPARISON:  March 16, 2021. FINDINGS: The right glenoid and  humeral components are well situated. IMPRESSION: Status post right reverse total shoulder arthroplasty. Electronically Signed   By: Marijo Conception M.D.   On: 07/01/2021 16:55    Disposition: Discharge disposition: 01-Home or Self Care       Discharge Instructions     Call MD / Call 911   Complete by: As directed    If you experience chest pain or shortness of breath, CALL 911 and be transported to the hospital emergency room.  If you develope a fever above 101 F, pus (white drainage) or increased drainage or redness at the wound, or calf pain, call your surgeon's office.   Constipation Prevention   Complete by: As directed    Drink plenty of fluids.  Prune juice may be helpful.  You may use a stool softener, such as Colace (over the counter) 100 mg twice a day.  Use MiraLax (over the counter) for constipation as needed.   Diet - low sodium heart healthy   Complete by: As directed    Discharge instructions   Complete by: As directed    Discharge Instructions after Reverse Total Shoulder Arthroplasty   A sling has been provided for you. You are to wear this at all times (except for bathing and dressing), until your first post operative visit with Dr. Tamera Punt. Please also wear while sleeping at night. While you bath and dress, let the arm/elbow extend straight down to stretch your elbow. Wiggle your fingers and pump your first while your in the sling to prevent hand swelling. Use ice on the shoulder intermittently over the first 48 hours after surgery. Continue to use ice or and ice machine as needed after 48 hours for pain control/swelling.  Pain medicine has been prescribed for you.  Use your medicine liberally over the first 48 hours, and then you can begin to taper your use. You may take Extra Strength Tylenol or Tylenol only in place of the pain pills. DO NOT take ANY nonsteroidal anti-inflammatory pain medications: Advil, Motrin, Ibuprofen, Aleve, Naproxen or Naprosyn.  Take one  aspirin a day for 2 weeks after surgery, unless you have an aspirin sensitivity/allergy or asthma.  Leave your dressing on until your first follow up visit.  You  may shower with the dressing.  Hold your arm as if you still have your sling on while you shower. Simply allow the water to wash over the site and then pat dry. Make sure your axilla (armpit) is completely dry after showering.    Please call [phone] during normal business hours or [phone] after hours for any problems. Including the following:  - excessive redness of the incisions - drainage for more than 4 days - fever of more than 101.5 F  *Please note that pain medications will not be refilled after hours or on weekends.  Dental Antibiotics:  In most cases prophylactic antibiotics for Dental procdeures after total joint surgery are not necessary.  Exceptions are as follows:  1. History of prior total joint infection  2. Severely immunocompromised (Organ Transplant, cancer chemotherapy, Rheumatoid biologic meds such as [place])  3. Poorly controlled diabetes (A1C &gt; 8.0, blood glucose over 200)  If you have one of these conditions, contact your surgeon for an antibiotic prescription, prior to your dental procedure.   Increase activity slowly as tolerated   Complete by: As directed    Post-operative opioid taper instructions:   Complete by: As directed    POST-OPERATIVE OPIOID TAPER INSTRUCTIONS: It is important to wean off of your opioid medication as soon as possible. If you do not need pain medication after your surgery it is ok to stop day one. Opioids include: Codeine, Hydrocodone(Norco, Vicodin), Oxycodone(Percocet, oxycontin) and hydromorphone amongst others.  Long term and even short term use of opiods can cause: Increased pain response Dependence Constipation Depression Respiratory depression And more.  Withdrawal symptoms can include Flu like symptoms Nausea, vomiting And more Techniques  to manage these symptoms Hydrate well Eat regular healthy meals Stay active Use relaxation techniques(deep breathing, meditating, yoga) Do Not substitute Alcohol to help with tapering If you have been on opioids for less than two weeks and do not have pain than it is ok to stop all together.  Plan to wean off of opioids This plan should start within one week post op of your joint replacement. Maintain the same interval or time between taking each dose and first decrease the dose.  Cut the total daily intake of opioids by one tablet each day Next start to increase the time between doses. The last dose that should be eliminated is the evening dose.           Follow-up Information     Tania Ade, MD. Schedule an appointment as soon as possible for a visit in 2 week(s).   Specialty: Orthopedic Surgery Contact information: [address] [phone]                  Signed: Luetta Nutting L. [place], PA-C 07/02/2021, 8:06 AM

## 2021-07-02 NOTE — Plan of Care (Signed)
  Problem: Education: Goal: Knowledge of the prescribed therapeutic regimen will improve Outcome: Progressing   Problem: Activity: Goal: Ability to tolerate increased activity will improve Outcome: Progressing   Problem: Pain Management: Goal: Pain level will decrease with appropriate interventions Outcome: Progressing   Problem: Clinical Measurements: Goal: Ability to maintain clinical measurements within normal limits will improve Outcome: Progressing   Problem: Nutrition: Goal: Adequate nutrition will be maintained Outcome: Progressing   Problem: Coping: Goal: Level of anxiety will decrease Outcome: Progressing   Problem: Elimination: Goal: Will not experience complications related to bowel motility Outcome: Progressing

## 2021-07-06 ENCOUNTER — Encounter (HOSPITAL_COMMUNITY): Payer: Self-pay | Admitting: Orthopedic Surgery

## 2021-07-12 DIAGNOSIS — I5022 Chronic systolic (congestive) heart failure: Secondary | ICD-10-CM | POA: Diagnosis not present

## 2021-07-12 DIAGNOSIS — E785 Hyperlipidemia, unspecified: Secondary | ICD-10-CM | POA: Diagnosis not present

## 2021-07-12 DIAGNOSIS — I10 Essential (primary) hypertension: Secondary | ICD-10-CM | POA: Diagnosis not present

## 2021-07-12 DIAGNOSIS — R262 Difficulty in walking, not elsewhere classified: Secondary | ICD-10-CM | POA: Diagnosis not present

## 2021-07-16 DIAGNOSIS — M25511 Pain in right shoulder: Secondary | ICD-10-CM | POA: Diagnosis not present

## 2021-07-16 DIAGNOSIS — Z9889 Other specified postprocedural states: Secondary | ICD-10-CM | POA: Diagnosis not present

## 2021-08-02 DIAGNOSIS — E785 Hyperlipidemia, unspecified: Secondary | ICD-10-CM | POA: Diagnosis not present

## 2021-08-02 DIAGNOSIS — R262 Difficulty in walking, not elsewhere classified: Secondary | ICD-10-CM | POA: Diagnosis not present

## 2021-08-02 DIAGNOSIS — I5022 Chronic systolic (congestive) heart failure: Secondary | ICD-10-CM | POA: Diagnosis not present

## 2021-08-02 DIAGNOSIS — I10 Essential (primary) hypertension: Secondary | ICD-10-CM | POA: Diagnosis not present

## 2021-08-13 DIAGNOSIS — M25511 Pain in right shoulder: Secondary | ICD-10-CM | POA: Diagnosis not present

## 2021-08-16 DIAGNOSIS — Z96611 Presence of right artificial shoulder joint: Secondary | ICD-10-CM | POA: Diagnosis not present

## 2021-08-16 DIAGNOSIS — M25611 Stiffness of right shoulder, not elsewhere classified: Secondary | ICD-10-CM | POA: Diagnosis not present

## 2021-08-25 DIAGNOSIS — M25611 Stiffness of right shoulder, not elsewhere classified: Secondary | ICD-10-CM | POA: Diagnosis not present

## 2021-08-25 DIAGNOSIS — Z96611 Presence of right artificial shoulder joint: Secondary | ICD-10-CM | POA: Diagnosis not present

## 2021-08-30 DIAGNOSIS — M25611 Stiffness of right shoulder, not elsewhere classified: Secondary | ICD-10-CM | POA: Diagnosis not present

## 2021-08-30 DIAGNOSIS — Z96611 Presence of right artificial shoulder joint: Secondary | ICD-10-CM | POA: Diagnosis not present

## 2021-09-08 DIAGNOSIS — M25611 Stiffness of right shoulder, not elsewhere classified: Secondary | ICD-10-CM | POA: Diagnosis not present

## 2021-09-08 DIAGNOSIS — Z96611 Presence of right artificial shoulder joint: Secondary | ICD-10-CM | POA: Diagnosis not present

## 2021-09-09 DIAGNOSIS — E785 Hyperlipidemia, unspecified: Secondary | ICD-10-CM | POA: Diagnosis not present

## 2021-09-09 DIAGNOSIS — R262 Difficulty in walking, not elsewhere classified: Secondary | ICD-10-CM | POA: Diagnosis not present

## 2021-09-09 DIAGNOSIS — I5022 Chronic systolic (congestive) heart failure: Secondary | ICD-10-CM | POA: Diagnosis not present

## 2021-09-09 DIAGNOSIS — I10 Essential (primary) hypertension: Secondary | ICD-10-CM | POA: Diagnosis not present

## 2021-09-15 DIAGNOSIS — Z96611 Presence of right artificial shoulder joint: Secondary | ICD-10-CM | POA: Diagnosis not present

## 2021-09-15 DIAGNOSIS — M25611 Stiffness of right shoulder, not elsewhere classified: Secondary | ICD-10-CM | POA: Diagnosis not present

## 2021-09-16 ENCOUNTER — Other Ambulatory Visit (HOSPITAL_COMMUNITY): Payer: Self-pay | Admitting: Orthopedic Surgery

## 2021-09-16 ENCOUNTER — Other Ambulatory Visit: Payer: Self-pay | Admitting: Orthopedic Surgery

## 2021-09-16 DIAGNOSIS — Z96652 Presence of left artificial knee joint: Secondary | ICD-10-CM | POA: Diagnosis not present

## 2021-09-16 DIAGNOSIS — M25562 Pain in left knee: Secondary | ICD-10-CM | POA: Diagnosis not present

## 2021-09-20 DIAGNOSIS — R262 Difficulty in walking, not elsewhere classified: Secondary | ICD-10-CM | POA: Diagnosis not present

## 2021-09-20 DIAGNOSIS — I5022 Chronic systolic (congestive) heart failure: Secondary | ICD-10-CM | POA: Diagnosis not present

## 2021-09-20 DIAGNOSIS — E785 Hyperlipidemia, unspecified: Secondary | ICD-10-CM | POA: Diagnosis not present

## 2021-09-20 DIAGNOSIS — I10 Essential (primary) hypertension: Secondary | ICD-10-CM | POA: Diagnosis not present

## 2021-09-24 DIAGNOSIS — Z9889 Other specified postprocedural states: Secondary | ICD-10-CM | POA: Diagnosis not present

## 2021-09-24 DIAGNOSIS — M25511 Pain in right shoulder: Secondary | ICD-10-CM | POA: Diagnosis not present

## 2021-09-29 ENCOUNTER — Other Ambulatory Visit: Payer: Self-pay

## 2021-09-29 ENCOUNTER — Encounter (HOSPITAL_COMMUNITY)
Admission: RE | Admit: 2021-09-29 | Discharge: 2021-09-29 | Disposition: A | Discharge status: Home / Self Care | Payer: PPO | Source: Ambulatory Visit | Attending: Orthopedic Surgery | Admitting: Orthopedic Surgery

## 2021-09-29 DIAGNOSIS — Z96653 Presence of artificial knee joint, bilateral: Secondary | ICD-10-CM | POA: Diagnosis not present

## 2021-09-29 DIAGNOSIS — M25562 Pain in left knee: Secondary | ICD-10-CM | POA: Diagnosis not present

## 2021-09-29 DIAGNOSIS — Z471 Aftercare following joint replacement surgery: Secondary | ICD-10-CM | POA: Diagnosis not present

## 2021-09-29 MED ORDER — TECHNETIUM TC 99M MEDRONATE IV KIT
20.0000 | PACK | Freq: Once | INTRAVENOUS | Status: AC | PRN
  Administered 2021-09-29: 20 via INTRAVENOUS

## 2021-10-05 DIAGNOSIS — M25562 Pain in left knee: Secondary | ICD-10-CM | POA: Diagnosis not present

## 2021-10-05 DIAGNOSIS — T8484XA Pain due to internal orthopedic prosthetic devices, implants and grafts, initial encounter: Secondary | ICD-10-CM | POA: Diagnosis not present

## 2021-10-08 DIAGNOSIS — M810 Age-related osteoporosis without current pathological fracture: Secondary | ICD-10-CM | POA: Diagnosis not present

## 2021-10-08 DIAGNOSIS — Z0000 Encounter for general adult medical examination without abnormal findings: Secondary | ICD-10-CM | POA: Diagnosis not present

## 2021-10-08 DIAGNOSIS — G894 Chronic pain syndrome: Secondary | ICD-10-CM | POA: Diagnosis not present

## 2021-10-08 DIAGNOSIS — R54 Age-related physical debility: Secondary | ICD-10-CM | POA: Diagnosis not present

## 2021-10-08 DIAGNOSIS — I10 Essential (primary) hypertension: Secondary | ICD-10-CM | POA: Diagnosis not present

## 2021-10-08 DIAGNOSIS — M159 Polyosteoarthritis, unspecified: Secondary | ICD-10-CM | POA: Diagnosis not present

## 2021-10-08 DIAGNOSIS — F3341 Major depressive disorder, recurrent, in partial remission: Secondary | ICD-10-CM | POA: Diagnosis not present

## 2021-10-08 DIAGNOSIS — E559 Vitamin D deficiency, unspecified: Secondary | ICD-10-CM | POA: Diagnosis not present

## 2021-10-08 DIAGNOSIS — Z1389 Encounter for screening for other disorder: Secondary | ICD-10-CM | POA: Diagnosis not present

## 2021-10-08 DIAGNOSIS — E7800 Pure hypercholesterolemia, unspecified: Secondary | ICD-10-CM | POA: Diagnosis not present

## 2021-10-15 DIAGNOSIS — R262 Difficulty in walking, not elsewhere classified: Secondary | ICD-10-CM | POA: Diagnosis not present

## 2021-10-15 DIAGNOSIS — I10 Essential (primary) hypertension: Secondary | ICD-10-CM | POA: Diagnosis not present

## 2021-10-15 DIAGNOSIS — I5022 Chronic systolic (congestive) heart failure: Secondary | ICD-10-CM | POA: Diagnosis not present

## 2021-10-15 DIAGNOSIS — E785 Hyperlipidemia, unspecified: Secondary | ICD-10-CM | POA: Diagnosis not present

## 2021-11-11 DIAGNOSIS — H524 Presbyopia: Secondary | ICD-10-CM | POA: Diagnosis not present

## 2021-11-11 DIAGNOSIS — H34213 Partial retinal artery occlusion, bilateral: Secondary | ICD-10-CM | POA: Diagnosis not present

## 2021-11-11 DIAGNOSIS — H532 Diplopia: Secondary | ICD-10-CM | POA: Diagnosis not present

## 2021-11-25 DIAGNOSIS — M25562 Pain in left knee: Secondary | ICD-10-CM | POA: Diagnosis not present

## 2021-11-25 DIAGNOSIS — M25512 Pain in left shoulder: Secondary | ICD-10-CM | POA: Diagnosis not present

## 2021-11-25 DIAGNOSIS — M7122 Synovial cyst of popliteal space [Baker], left knee: Secondary | ICD-10-CM | POA: Diagnosis not present

## 2021-11-25 DIAGNOSIS — M7542 Impingement syndrome of left shoulder: Secondary | ICD-10-CM | POA: Diagnosis not present

## 2021-12-02 DIAGNOSIS — I5022 Chronic systolic (congestive) heart failure: Secondary | ICD-10-CM | POA: Diagnosis not present

## 2021-12-02 DIAGNOSIS — R262 Difficulty in walking, not elsewhere classified: Secondary | ICD-10-CM | POA: Diagnosis not present

## 2021-12-02 DIAGNOSIS — I10 Essential (primary) hypertension: Secondary | ICD-10-CM | POA: Diagnosis not present

## 2021-12-02 DIAGNOSIS — E785 Hyperlipidemia, unspecified: Secondary | ICD-10-CM | POA: Diagnosis not present

## 2021-12-15 DIAGNOSIS — M25562 Pain in left knee: Secondary | ICD-10-CM | POA: Diagnosis not present

## 2021-12-15 DIAGNOSIS — M7122 Synovial cyst of popliteal space [Baker], left knee: Secondary | ICD-10-CM | POA: Diagnosis not present

## 2021-12-27 DIAGNOSIS — R262 Difficulty in walking, not elsewhere classified: Secondary | ICD-10-CM | POA: Diagnosis not present

## 2021-12-27 DIAGNOSIS — I10 Essential (primary) hypertension: Secondary | ICD-10-CM | POA: Diagnosis not present

## 2021-12-27 DIAGNOSIS — E785 Hyperlipidemia, unspecified: Secondary | ICD-10-CM | POA: Diagnosis not present

## 2021-12-27 DIAGNOSIS — I5022 Chronic systolic (congestive) heart failure: Secondary | ICD-10-CM | POA: Diagnosis not present

## 2021-12-28 NOTE — Progress Notes (Signed)
?Cardiology Clinic Note  ? ?Patient Name: Terri Liu ?Date of Encounter: 12/31/2021 ? ?Primary Care Provider:  Leeroy Cha, MD ?Primary Cardiologist:  Elouise Munroe, MD ? ?Patient Profile  ?  ?88 year old female patient with history of chronic HFrEF (EF 40 to 45% per echo 2021), chronic left bundle branch block, hypertension, hyperlipidemia, history of hospitalization for COVID-19 infectio in 2021, other history includes carpal tunnel of the right wrist and anxiety.  Last seen in the office by Dr.Acharya on 10/01/2020.  It was noted that the patient was no longer on diuretics on follow-up consideration for spironolactone for better blood pressure control as he was elevated in the office.  No changes were made at the time of that last office visit. ? ?Past Medical History  ?  ?Past Medical History:  ?Diagnosis Date  ? Arthritis   ? Diarrhea   ? "sometimes ., due to Gallbladder removed"  ? High cholesterol   ? History of shingles   ? effecrt nerve sees neurologist in [facility]  ? Hypertension   ? Post herpetic neuralgia 08/15/2016  ? Right V1  ? Shingles   ? ?Past Surgical History:  ?Procedure Laterality Date  ? ABDOMINAL HYSTERECTOMY    ? COMPLETE  ? CARPAL TUNNEL RELEASE Bilateral   ? CHOLECYSTECTOMY    ? EYE SURGERY    ? [place]  ? FEMUR IM NAIL Left 01/12/2017  ? Procedure: INTRAMEDULLARY (IM) NAIL FEMORAL LEFT;  Surgeon: Rod Can, MD;  Location: [facility];  Service: Orthopedics;  Laterality: Left;  ? GAS INSERTION Right 01/27/2015  ? Procedure: INSERTION OF GAS;  Surgeon: Hayden Pedro, MD;  Location: [place];  Service: Ophthalmology;  Laterality: Right;  [id number]  ? JOINT REPLACEMENT Right 4 YRS AGO  ? KNEE  ? KNEE ARTHROSCOPY Bilateral   ? MEMBRANE PEEL Right 01/27/2015  ? Procedure: MEMBRANE PEEL;  Surgeon: Hayden Pedro, MD;  Location: [place];  Service: Ophthalmology;  Laterality: Right;  ? PERFLUORONE INJECTION Right 01/27/2015  ? Procedure: PERFLUORONE INJECTION;  Surgeon: Hayden Pedro, MD;   Location: [place];  Service: Ophthalmology;  Laterality: Right;  ? PHOTOCOAGULATION WITH LASER Right 01/27/2015  ? Procedure: PHOTOCOAGULATION WITH LASER;  Surgeon: Hayden Pedro, MD;  Location: [place];  Service: Ophthalmology;  Laterality: Right;  ENDOLASER  ? 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 Procedure: REPAIR OF COMPLEX TRACTION RETINAL DETACHMENT;  Surgeon: Hayden Pedro, MD;  Location: [place];  Service: Ophthalmology;  Laterality: Right;  ? REVERSE SHOULDER ARTHROPLASTY Right 07/01/2021  ? Procedure: REVERSE SHOULDER ARTHROPLASTY;  Surgeon: Tania Ade, MD;  Location: [facility];  Service: Orthopedics;  Laterality: Right;  ? SHOULDER INJECTION Left 07/01/2021  ? Procedure: SHOULDER INTRA-OP  INTRA -ART INJECTION;  Surgeon: Tania Ade, MD;  Location: [facility];  Service: Orthopedics;  Laterality: Left;  ? TOTAL KNEE ARTHROPLASTY Left 12/17/2015  ? Procedure: LEFT TOTAL KNEE ARTHROPLASTY;  Surgeon: Susa Day, MD;  Location: [facility];  Service: Orthopedics;  Laterality: Left;  ? VITRECTOMY 25 GAUGE WITH SCLERAL BUCKLE Right 01/27/2015  ? Procedure: VITRECTOMY 25 GAUGE WITH SCLERAL BUCKLE;  Surgeon: Hayden Pedro, MD;  Location: [place];  Service: Ophthalmology;  Laterality: Right;  ? ? ?Allergies ? ?Allergies  ?Allergen Reactions  ? Cymbalta [Duloxetine Hcl] Other (See Comments)  ?  Made her very sick (flu-like symptoms)  ? Lipitor [Atorvastatin] Other (See Comments)  ?  Muscle pain in legs  ? Other Other (See Comments)  ?  Other reaction(s): OPIOID - analgesic unknown reaction  ? Amlodipine Besylate-Valsartan Rash  ? Codeine Rash  ? Hydrocodone-Acetaminophen Rash  ? Tape Other (See Comments)  ?  Pulled skin off. Paper tape is ok.   ? ? ?History of Present Illness  ?  ?Mr. Arechiga presents today for ongoing assessment and management of HFrEF, hypertension, and hyperlipidemia.  He was found to be hypertensive last office visit, he was not found to have diuretics on board at the last  encounter, consideration for adding spironolactone for better blood pressure control if necessary along with titration of metoprolol. ? ?Since being seen last the patient has had several new issues.  She fell and had a pelvic fracture, right shoulder fracture, the latter requiring surgical repair.  Her husband died of COVID approximately 9 months ago, 6 months later her son died (gunshot from neighbor who is angry about him mowing and causing leaves to blow into the neighbors yard).  She is very tearful when she describes all of her recent losses and injuries.  She does have 2 other children who are very supportive. ? ?Through all of this she has been medically compliant.  She denies any chest pain or cardiac issues. ? ?Home Medications  ?  ?Current Outpatient Medications  ?Medication Sig Dispense Refill  ? acetaminophen (TYLENOL) 500 MG tablet Take 1,000 mg by mouth every 6 (six) hours as needed for mild pain.    ? aspirin 81 MG EC tablet Take 1 tablet (81 mg total) by mouth daily.    ? Biotin 5000 MCG TABS Take 5,000 mcg by mouth daily.    ? Carboxymethylcellulose Sodium (ARTIFICIAL TEARS OP) Place 1 drop into both eyes daily as needed (dry eyes).    ? Cholecalciferol (VITAMIN D) 50 MCG (2000 UT) CAPS Take 2,000 Units by mouth daily.    ? escitalopram (LEXAPRO) 20 MG tablet Take 20 mg by mouth daily.    ? fluticasone (FLONASE) 50 MCG/ACT nasal spray Place 1 spray into both nostrils daily as needed for allergies or rhinitis.    ? losartan (COZAAR) 50 MG tablet Take 50 mg by mouth daily.    ? lovastatin (MEVACOR) 40 MG tablet Take 40 mg by mouth daily.  4  ? methocarbamol (ROBAXIN) 500 MG tablet Take 2 tablets (1,000 mg total) by mouth every 8 (eight) hours.    ? metoprolol tartrate (LOPRESSOR) 25 MG tablet Take 0.5 tablets (12.5 mg total) by mouth 2 (two) times daily. (Patient taking differently: Take 25 mg by mouth 2 (two) times daily.)    ? OXcarbazepine (TRILEPTAL) 150 MG tablet Take 1 tablet (150 mg total) by  mouth 2 (two) times daily. 20 tablet 0  ? pantoprazole (PROTONIX) 40 MG tablet Take 1 tablet (40 mg total) by mouth daily. (Patient taking differently: Take 40 mg by mouth daily as needed (ACID REFLUX).)    ? TART CHERRY PO Take 460 mg by mouth daily.    ? traMADol (ULTRAM) 50 MG tablet Take 1 tablet (50 mg total) by mouth every 6 (six) hours as needed. 20 tablet 0  ? vitamin B-12 (CYANOCOBALAMIN) 1000 MCG tablet Take 1,000 mcg by mouth daily.    ? vitamin C (ASCORBIC ACID) 500 MG tablet Take 500 mg by mouth daily.    ? ?No current facility-administered medications for this visit.  ?  ? ?Family History  ?  ?Family History  ?Problem Relation Age of Onset  ? Heart failure Mother   ? Cancer - Prostate Father   ?  Heart disease Brother   ? Cancer Sister   ? Cancer Brother   ? ?She indicated that her mother is deceased. She indicated that her father is deceased. She indicated that both of her sisters are deceased. She indicated that both of her brothers are deceased. ? ?Social History  ?  ?Social History  ? ?Socioeconomic History  ? Marital status: Widowed  ?  Spouse name: Not on file  ? Number of children: 3  ? Years of education: 10  ? Highest education level: Not on file  ?Occupational History  ? Occupation: N/A  ?Tobacco Use  ? Smoking status: Never  ? Smokeless tobacco: Never  ?Vaping Use  ? Vaping Use: Never used  ?Substance and Sexual Activity  ? Alcohol use: No  ? Drug use: No  ? Sexual activity: Not on file  ?Other Topics Concern  ? Not on file  ?Social History Narrative  ? Lives at home w/ her husband  ? Right-handed  ? Caffeine: 2 cups of half caf coffee each morning  ? ?Social Determinants of Health  ? ?Financial Resource Strain: Not on file  ?Food Insecurity: Not on file  ?Transportation Needs: Not on file  ?Physical Activity: Not on file  ?Stress: Not on file  ?Social Connections: Not on file  ?Intimate Partner Violence: Not on file  ?  ? ?Review of Systems  ?  ?General:  No chills, fever, night sweats or  weight changes.  ?Cardiovascular:  No chest pain, dyspnea on exertion, edema, orthopnea, palpitations, paroxysmal nocturnal dyspnea. ?Dermatological: No rash, lesions/masses ?Respiratory: No cough, dyspnea

## 2021-12-31 ENCOUNTER — Encounter: Payer: Self-pay | Admitting: Adult Health

## 2021-12-31 ENCOUNTER — Ambulatory Visit (INDEPENDENT_AMBULATORY_CARE_PROVIDER_SITE_OTHER): Payer: PPO | Admitting: Adult Health

## 2021-12-31 VITALS — BP 134/76 | HR 65 | Ht 63.0 in | Wt 160.8 lb

## 2021-12-31 DIAGNOSIS — F32A Depression, unspecified: Secondary | ICD-10-CM | POA: Diagnosis not present

## 2021-12-31 DIAGNOSIS — I5022 Chronic systolic (congestive) heart failure: Secondary | ICD-10-CM

## 2021-12-31 DIAGNOSIS — E785 Hyperlipidemia, unspecified: Secondary | ICD-10-CM

## 2021-12-31 DIAGNOSIS — I10 Essential (primary) hypertension: Secondary | ICD-10-CM

## 2021-12-31 NOTE — Patient Instructions (Signed)
Medication Instructions:  ?No Changes ?*If you need a refill on your cardiac medications before your next appointment, please call your pharmacy* ? ? ?Lab Work: ?No labs ?If you have labs (blood work) drawn today and your tests are completely normal, you will receive your results only by: ?MyChart Message (if you have MyChart) OR ?A paper copy in the mail ?If you have any lab test that is abnormal or we need to change your treatment, we will call you to review the results. ? ? ?Testing/Procedures: ?No Testing ? ? ?Follow-Up: ?At [facility], you and your health needs are our priority.  As part of our continuing mission to provide you with exceptional heart care, we have created designated Provider Care Teams.  These Care Teams include your primary Cardiologist (physician) and Advanced Practice Providers (APPs -  Physician Assistants and Nurse Practitioners) who all work together to provide you with the care you need, when you need it. ? ?We recommend signing up for the patient portal called "MyChart".  Sign up information is provided on this After Visit Summary.  MyChart is used to connect with patients for Virtual Visits (Telemedicine).  Patients are able to view lab/test results, encounter notes, upcoming appointments, etc.  Non-urgent messages can be sent to your provider as well.   ?To learn more about what you can do with MyChart, go to [email or web address].   ? ?Your next appointment:   ?6 month(s) ? ?The format for your next appointment:   ?In Person ? ?Provider:   ?Elouise Munroe, MD   ? ? ? ? ?Important Information About Sugar ? ? ? ? ?  ?

## 2022-02-06 DIAGNOSIS — I469 Cardiac arrest, cause unspecified: Secondary | ICD-10-CM | POA: Diagnosis not present

## 2022-02-06 DIAGNOSIS — R404 Transient alteration of awareness: Secondary | ICD-10-CM | POA: Diagnosis not present

## 2022-02-10 DIAGNOSIS — 419620001 Death: Secondary | SNOMED CT | POA: Diagnosis not present

## 2022-02-10 DEATH — deceased
